# Patient Record
Sex: Female | Born: 1995 | Race: White | Hispanic: No | Marital: Single | Smoking: Never smoker
Health system: Southern US, Community
[De-identification: ages and names within clinical notes are randomized; demographics above are authoritative.]

## PROBLEM LIST (undated history)

## (undated) ENCOUNTER — Inpatient Hospital Stay (HOSPITAL_COMMUNITY): Payer: Self-pay

## (undated) DIAGNOSIS — J45909 Unspecified asthma, uncomplicated: Secondary | ICD-10-CM

## (undated) DIAGNOSIS — O139 Gestational [pregnancy-induced] hypertension without significant proteinuria, unspecified trimester: Secondary | ICD-10-CM

## (undated) HISTORY — PX: WISDOM TOOTH EXTRACTION: SHX21

## (undated) HISTORY — PX: NO PAST SURGERIES: SHX2092

## (undated) HISTORY — DX: Gestational (pregnancy-induced) hypertension without significant proteinuria, unspecified trimester: O13.9

---

## 2016-06-02 ENCOUNTER — Inpatient Hospital Stay (HOSPITAL_COMMUNITY)
Admission: AD | Admit: 2016-06-02 | Discharge: 2016-06-02 | Disposition: A | Discharge status: Home / Self Care | Payer: Medicaid Other | Source: Ambulatory Visit | Attending: Obstetrics and Gynecology | Admitting: Obstetrics and Gynecology

## 2016-06-02 ENCOUNTER — Encounter (HOSPITAL_COMMUNITY): Payer: Self-pay | Admitting: *Deleted

## 2016-06-02 ENCOUNTER — Inpatient Hospital Stay (HOSPITAL_COMMUNITY): Payer: Medicaid Other

## 2016-06-02 DIAGNOSIS — O209 Hemorrhage in early pregnancy, unspecified: Secondary | ICD-10-CM | POA: Insufficient documentation

## 2016-06-02 DIAGNOSIS — J45909 Unspecified asthma, uncomplicated: Secondary | ICD-10-CM | POA: Insufficient documentation

## 2016-06-02 DIAGNOSIS — O3482 Maternal care for other abnormalities of pelvic organs, second trimester: Secondary | ICD-10-CM | POA: Diagnosis not present

## 2016-06-02 DIAGNOSIS — O99512 Diseases of the respiratory system complicating pregnancy, second trimester: Secondary | ICD-10-CM | POA: Diagnosis not present

## 2016-06-02 DIAGNOSIS — R109 Unspecified abdominal pain: Secondary | ICD-10-CM | POA: Diagnosis present

## 2016-06-02 DIAGNOSIS — O208 Other hemorrhage in early pregnancy: Secondary | ICD-10-CM | POA: Insufficient documentation

## 2016-06-02 DIAGNOSIS — Z3A11 11 weeks gestation of pregnancy: Secondary | ICD-10-CM | POA: Diagnosis not present

## 2016-06-02 DIAGNOSIS — O021 Missed abortion: Secondary | ICD-10-CM | POA: Diagnosis not present

## 2016-06-02 DIAGNOSIS — N83202 Unspecified ovarian cyst, left side: Secondary | ICD-10-CM | POA: Diagnosis not present

## 2016-06-02 DIAGNOSIS — N939 Abnormal uterine and vaginal bleeding, unspecified: Secondary | ICD-10-CM | POA: Diagnosis present

## 2016-06-02 HISTORY — DX: Unspecified asthma, uncomplicated: J45.909

## 2016-06-02 LAB — URINALYSIS, ROUTINE W REFLEX MICROSCOPIC
Bilirubin Urine: NEGATIVE
GLUCOSE, UA: NEGATIVE mg/dL
KETONES UR: NEGATIVE mg/dL
Nitrite: NEGATIVE
PROTEIN: NEGATIVE mg/dL
Specific Gravity, Urine: <1.005 — ABNORMAL LOW (ref 1.005–1.030)
pH: 6 (ref 5.0–8.0)

## 2016-06-02 LAB — POCT PREGNANCY, URINE: Preg Test, Ur: POSITIVE — AB

## 2016-06-02 LAB — URINE MICROSCOPIC-ADD ON: RBC / HPF: NONE SEEN RBC/hpf (ref 0–5)

## 2016-06-02 LAB — CBC
HEMATOCRIT: 40.5 % (ref 36.0–46.0)
HEMOGLOBIN: 13.9 g/dL (ref 12.0–15.0)
MCH: 30.7 pg (ref 26.0–34.0)
MCHC: 34.3 g/dL (ref 30.0–36.0)
MCV: 89.4 fL (ref 78.0–100.0)
Platelets: 307 10*3/uL (ref 150–400)
RBC: 4.53 MIL/uL (ref 3.87–5.11)
RDW: 12.5 % (ref 11.5–15.5)
WBC: 14.7 10*3/uL — ABNORMAL HIGH (ref 4.0–10.5)

## 2016-06-02 LAB — ABO/RH: ABO/RH(D): A NEG

## 2016-06-02 LAB — HCG, QUANTITATIVE, PREGNANCY: hCG, Beta Chain, Quant, S: 8270 m[IU]/mL — ABNORMAL HIGH (ref <5)

## 2016-06-02 IMAGING — US US OB TRANSVAGINAL
1 series · 15 of 28 positions shown · non-contrast
Comparison: None.

CLINICAL DATA: Vaginal bleeding. Early pregnancy. Quantitative beta
HCG level is [DATE]. Patient is 12 weeks and 4 days pregnant based on
her last menstrual period.

EXAM:
OBSTETRIC <14 WK US AND TRANSVAGINAL OB US
TECHNIQUE: Both transabdominal and transvaginal ultrasound examinations were
performed for complete evaluation of the gestation as well as the
maternal uterus, adnexal regions, and pelvic cul-de-sac.
Transvaginal technique was performed to assess early pregnancy.

[Series 1: us ob transvaginal · 64 acquisitions, 15 frames shown]
[im 1/64]
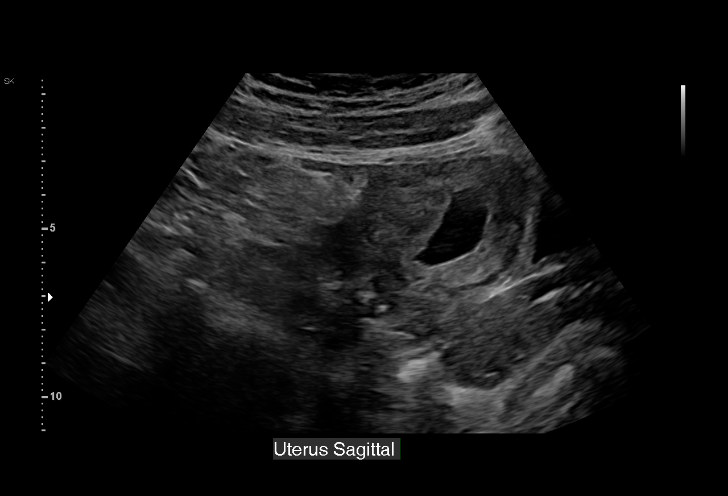
[im 5/64]
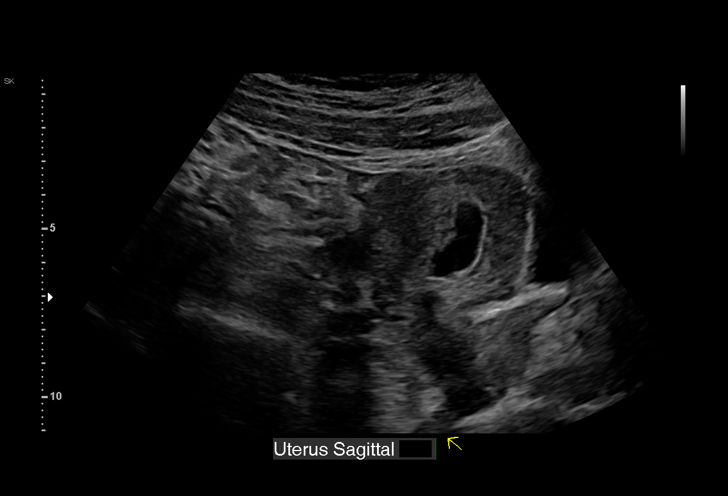
[im 10/64]
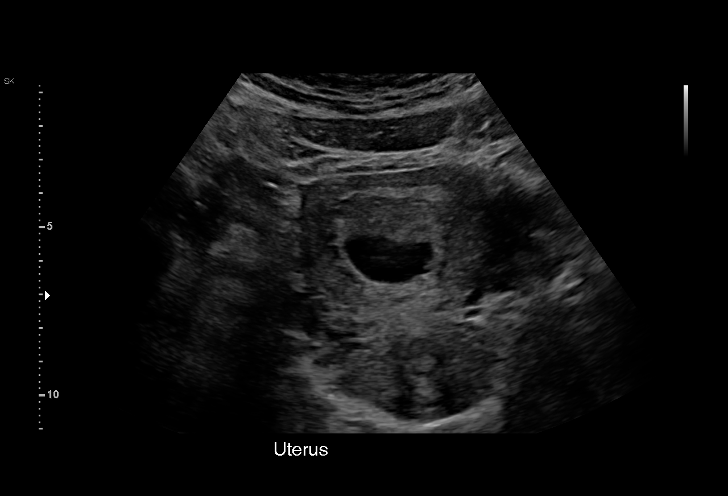
[im 15/64]
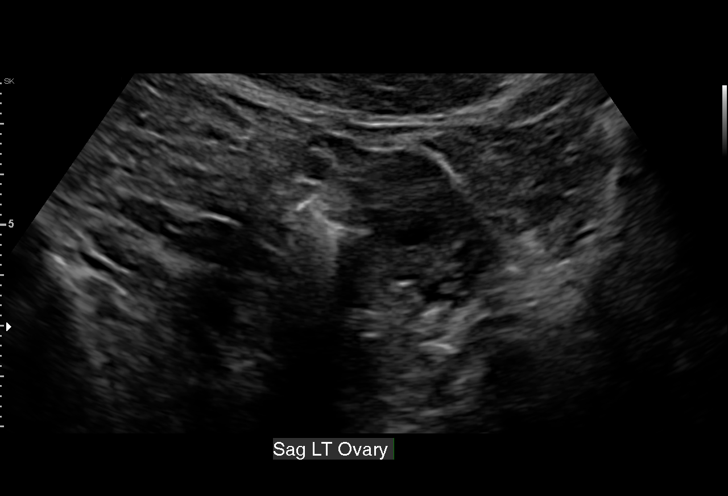
[im 19/64]
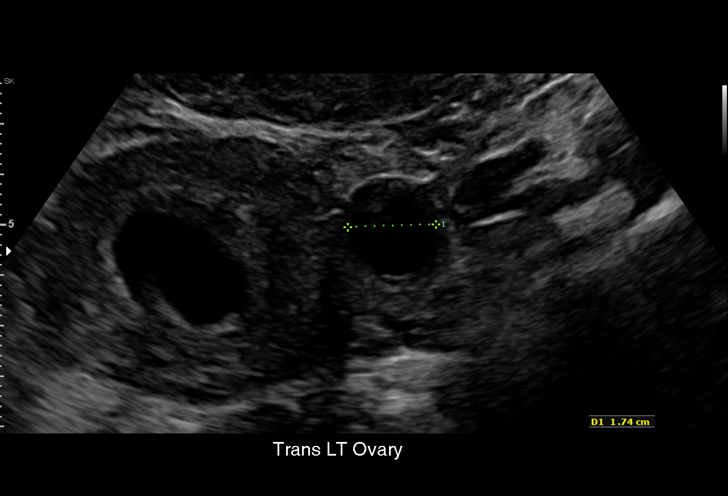
[im 24/64]
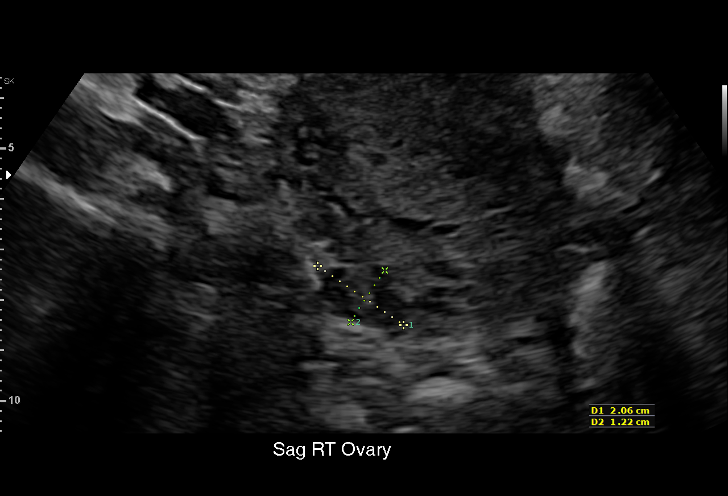
[im 29/64]
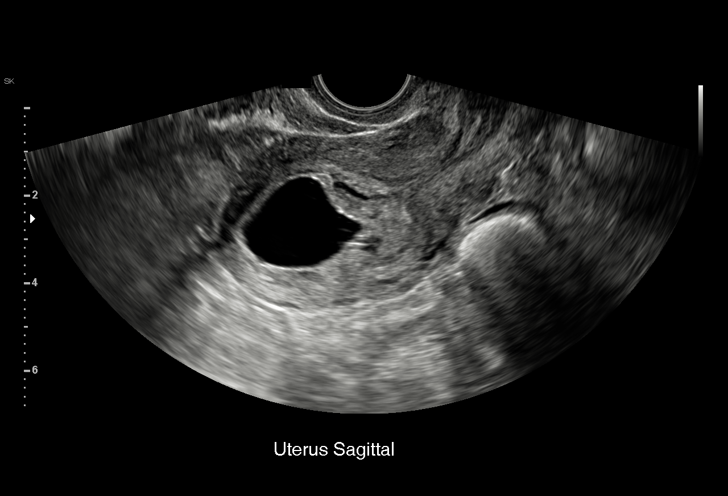
[im 33/64]
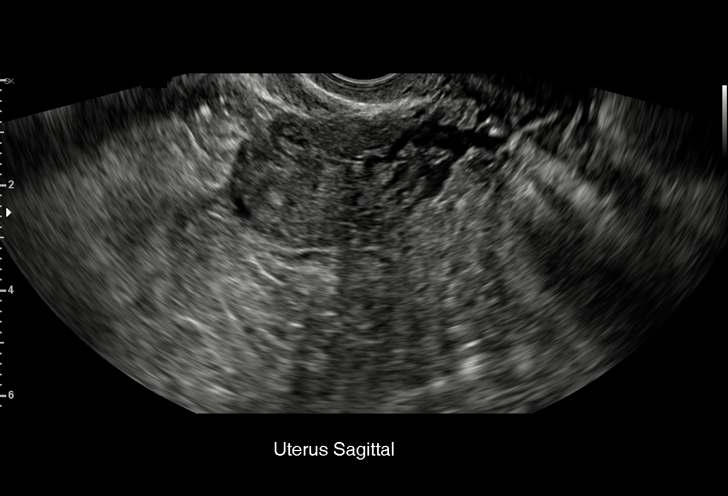
[im 36/64]
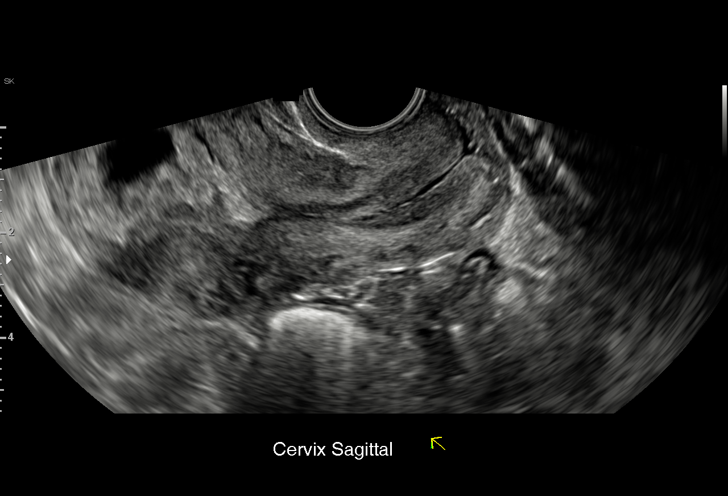
[im 40/64]
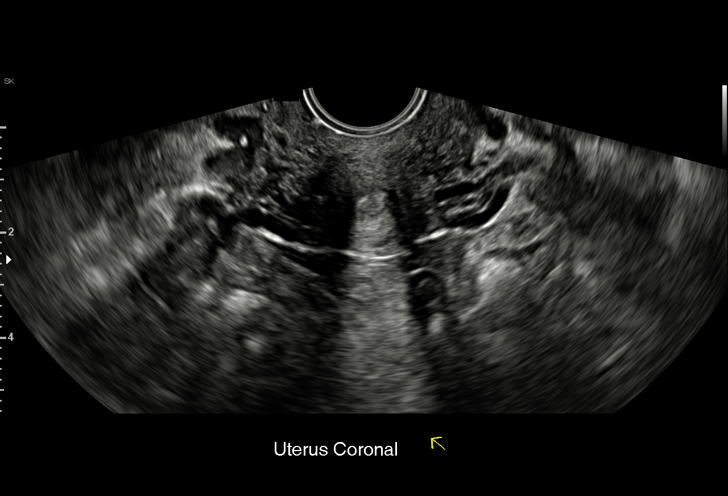
[im 45/64]
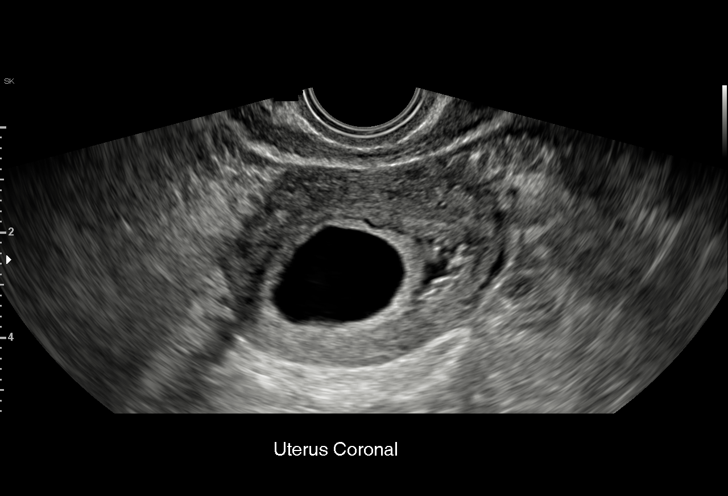
[im 50/64]
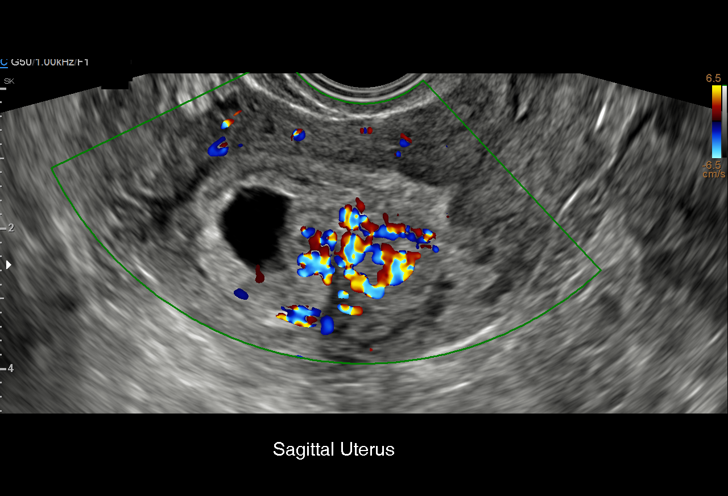
[im 54/64]
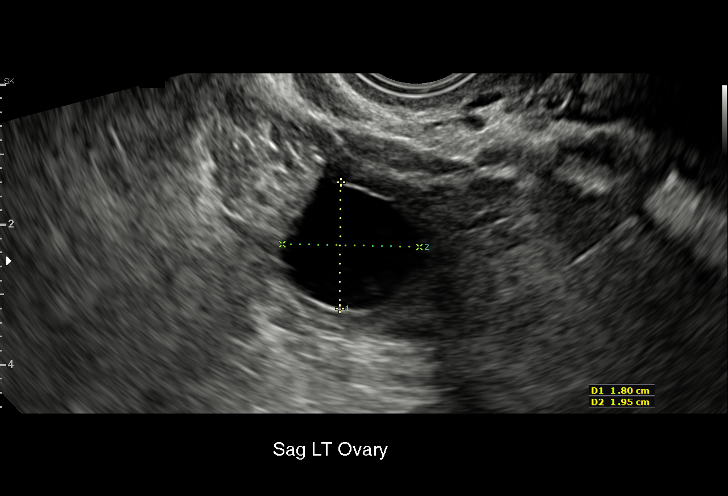
[im 59/64]
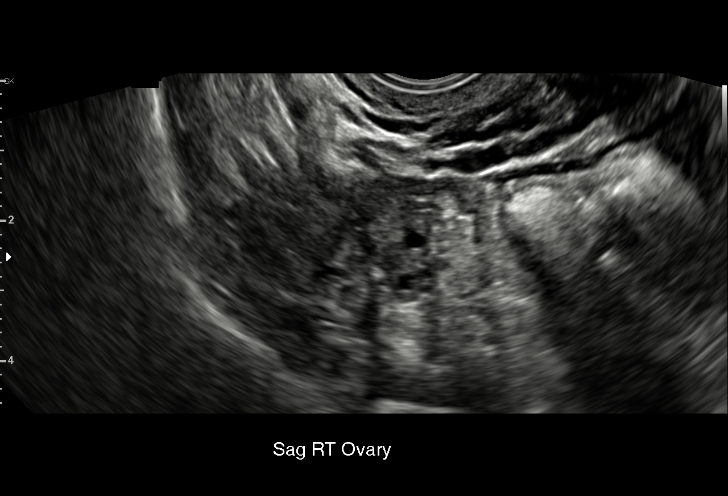
[im 64/64]
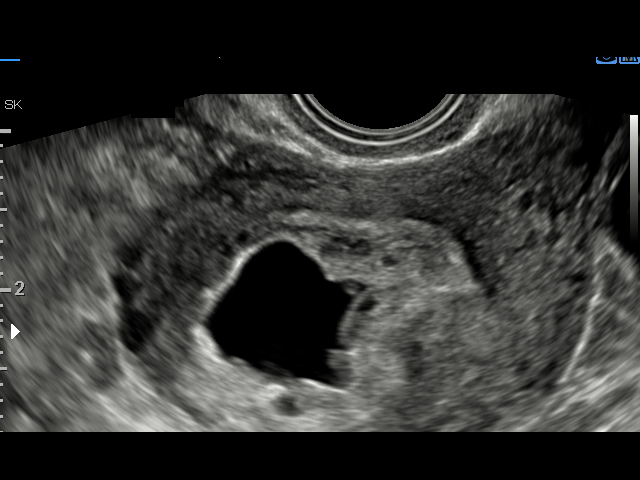

[15 of 28 positions shown; findings below may reference images not displayed]

FINDINGS: Intrauterine gestational sac: There is an irregular gestational sac
in the upper uterine segment.

Yolk sac:  No

Embryo:  No

Cardiac Activity: Not applicable

MSD: 25  mm   7 w   3  d

Subchorionic hemorrhage:  Small subchronic hemorrhage.

Maternal uterus/adnexae: No uterine masses. Cervix is closed. 2 cm
cyst arises from the left ovary. Normal right ovary. No adnexal
masses. No pelvic free fluid.
IMPRESSION: 1. Patient has an irregular gestational sac with a mean diameter of
25 mm with no embryo or yolk sac. Findings meet definitive criteria
for failed pregnancy. This follows SRU consensus guidelines:
Diagnostic Criteria for Nonviable Pregnancy Early in the First
Trimester. N Engl J Med [MM];[DATE].

2. Small subchronic hemorrhage. Unremarkable ovaries and adnexa. No
uterine masses. Closed cervix.

## 2016-06-02 MED ORDER — RHO D IMMUNE GLOBULIN 1500 UNIT/2ML IJ SOSY
300.0000 ug | PREFILLED_SYRINGE | Freq: Once | INTRAMUSCULAR | Status: AC
  Administered 2016-06-02: 300 ug via INTRAMUSCULAR
  Filled 2016-06-02: qty 2

## 2016-06-02 NOTE — MAU Provider Note (Signed)
History     CSN: [id number]  Arrival date and time: 06/02/16 1450   First Provider Initiated Contact with Patient 06/02/16 1809      Chief Complaint  Patient presents with  . Vaginal Bleeding  . Abdominal Pain   HPI Madison RedheadCynthia Cook is a 20 y.o. G1P0 at 4680w4d by LMP who presents with vaginal bleeding & abdominal cramping. LMP 03/13/16 & had positive UPT in July. Went to [facility] yesterday for vaginal bleeding & was told that the "pregnancy wasn't normal" and she was supposed to go back tomorrow for BHCG. Pt here for "second opinion". Reports vaginal bleeding; pink spotting since Sunday. Lower abdominal cramping like menstrual cramps since then as well that has improved today. Rates pain 2/10. Has not treated pain.   OB History    Gravida Para Term Preterm AB Living   1             SAB TAB Ectopic Multiple Live Births                  Past Medical History:  Diagnosis Date  . Asthma     Past Surgical History:  Procedure Laterality Date  . NO PAST SURGERIES      History reviewed. No pertinent family history.  Social History  Substance Use Topics  . Smoking status: Never Smoker  . Smokeless tobacco: Never Used  . Alcohol use No    Allergies: Allergies not on file  No prescriptions prior to admission.    Review of Systems  Constitutional: Negative.   Gastrointestinal: Positive for abdominal pain. Negative for constipation, diarrhea, nausea and vomiting.  Genitourinary: Negative for dysuria.       + vaginal bleeding   Physical Exam   Blood pressure 114/84, pulse 97, temperature 97.7 F (36.5 C), temperature source Oral, resp. rate 18, height 5\' 2"  (1.575 m), weight 196 lb (88.9 kg), last menstrual period 03/13/2016.  Physical Exam  Nursing note and vitals reviewed. Constitutional: She is oriented to person, place, and time. She appears well-developed and well-nourished. No distress.  HENT:  Head: Normocephalic and atraumatic.  Eyes: Conjunctivae are  normal. Right eye exhibits no discharge. Left eye exhibits no discharge. No scleral icterus.  Neck: Normal range of motion.  Respiratory: Effort normal. No respiratory distress.  GI: Soft. There is no tenderness.  Musculoskeletal: Normal range of motion.  Neurological: She is alert and oriented to person, place, and time.  Skin: Skin is warm and dry. She is not diaphoretic.  Psychiatric: She has a normal mood and affect. Her behavior is normal. Judgment and thought content normal.    MAU Course  Procedures Results for orders placed or performed during the hospital encounter of 06/02/16 (from the past 24 hour(s))  Urinalysis, Routine w reflex microscopic (not at [facility])     Status: Abnormal   Collection Time: 06/02/16  3:23 PM  Result Value Ref Range   Color, Urine YELLOW YELLOW   APPearance CLEAR CLEAR   Specific Gravity, Urine <1.005 (L) 1.005 - 1.030   pH 6.0 5.0 - 8.0   Glucose, UA NEGATIVE NEGATIVE mg/dL   Hgb urine dipstick SMALL (A) NEGATIVE   Bilirubin Urine NEGATIVE NEGATIVE   Ketones, ur NEGATIVE NEGATIVE mg/dL   Protein, ur NEGATIVE NEGATIVE mg/dL   Nitrite NEGATIVE NEGATIVE   Leukocytes, UA SMALL (A) NEGATIVE  Urine microscopic-add on     Status: Abnormal   Collection Time: 06/02/16  3:23 PM  Result Value Ref Range  Squamous Epithelial / LPF 0-5 (A) NONE SEEN   WBC, UA 0-5 0 - 5 WBC/hpf   RBC / HPF NONE SEEN 0 - 5 RBC/hpf   Bacteria, UA RARE (A) NONE SEEN  Pregnancy, urine POC     Status: Abnormal   Collection Time: 06/02/16  3:47 PM  Result Value Ref Range   Preg Test, Ur POSITIVE (A) NEGATIVE  Rh IG workup (includes ABO/Rh)     Status: None (Preliminary result)   Collection Time: 06/02/16  5:43 PM  Result Value Ref Range   Gestational Age(Wks) 10    ABO/RH(D) A NEG    Antibody Screen NEG    Unit Number [id number]    Blood Component Type RHIG    Unit division 00    Status of Unit ALLOCATED    Transfusion Status OK TO TRANSFUSE   CBC     Status: Abnormal    Collection Time: 06/02/16  5:53 PM  Result Value Ref Range   WBC 14.7 (H) 4.0 - 10.5 K/uL   RBC 4.53 3.87 - 5.11 MIL/uL   Hemoglobin 13.9 12.0 - 15.0 g/dL   HCT 11.9 14.7 - 82.9 %   MCV 89.4 78.0 - 100.0 fL   MCH 30.7 26.0 - 34.0 pg   MCHC 34.3 30.0 - 36.0 g/dL   RDW 56.2 13.0 - 86.5 %   Platelets 307 150 - 400 K/uL  ABO/Rh     Status: None   Collection Time: 06/02/16  5:53 PM  Result Value Ref Range   ABO/RH(D) A NEG   hCG, quantitative, pregnancy     Status: Abnormal   Collection Time: 06/02/16  5:53 PM  Result Value Ref Range   hCG, Beta Chain, Quant, S 8,270 (H) <5 mIU/mL   US Ob Comp Less 14 Wks  Result Date: 06/02/2016 CLINICAL DATA:  Vaginal bleeding. Early pregnancy. Quantitative beta HCG level is 8,270. Patient is 12 weeks and 4 days pregnant based on her last menstrual period. EXAM: OBSTETRIC <14 WK [place] AND TRANSVAGINAL OB US TECHNIQUE: Both transabdominal and transvaginal ultrasound examinations were performed for complete evaluation of the gestation as well as the maternal uterus, adnexal regions, and pelvic cul-de-sac. Transvaginal technique was performed to assess early pregnancy. COMPARISON:  None. FINDINGS: Intrauterine gestational sac: There is an irregular gestational sac in the upper uterine segment. Yolk sac:  No Embryo:  No Cardiac Activity: Not applicable MSD: 25  mm   7 w   3  d Subchorionic hemorrhage:  Small subchronic hemorrhage. Maternal uterus/adnexae: No uterine masses. Cervix is closed. 2 cm cyst arises from the left ovary. Normal right ovary. No adnexal masses. No pelvic free fluid. IMPRESSION: 1. Patient has an irregular gestational sac with a mean diameter of 25 mm with no embryo or yolk sac. Findings meet definitive criteria for failed pregnancy. This follows SRU consensus guidelines: Diagnostic Criteria for Nonviable Pregnancy Early in the First Trimester. Macy Mis J Med [phone]. 2. Small subchronic hemorrhage. Unremarkable ovaries and adnexa. No  uterine masses. Closed cervix. Electronically Signed   By: Amie Portland M.D.   On: 06/02/2016 19:33   US Ob Transvaginal  Result Date: 06/02/2016 CLINICAL DATA:  Vaginal bleeding. Early pregnancy. Quantitative beta HCG level is 8,270. Patient is 12 weeks and 4 days pregnant based on her last menstrual period. EXAM: OBSTETRIC <14 WK [place] AND TRANSVAGINAL OB US TECHNIQUE: Both transabdominal and transvaginal ultrasound examinations were performed for complete evaluation of the gestation as well as the maternal uterus,  adnexal regions, and pelvic cul-de-sac. Transvaginal technique was performed to assess early pregnancy. COMPARISON:  None. FINDINGS: Intrauterine gestational sac: There is an irregular gestational sac in the upper uterine segment. Yolk sac:  No Embryo:  No Cardiac Activity: Not applicable MSD: 25  mm   7 w   3  d Subchorionic hemorrhage:  Small subchronic hemorrhage. Maternal uterus/adnexae: No uterine masses. Cervix is closed. 2 cm cyst arises from the left ovary. Normal right ovary. No adnexal masses. No pelvic free fluid. IMPRESSION: 1. Patient has an irregular gestational sac with a mean diameter of 25 mm with no embryo or yolk sac. Findings meet definitive criteria for failed pregnancy. This follows SRU consensus guidelines: Diagnostic Criteria for Nonviable Pregnancy Early in the First Trimester. Macy Mis J Med [phone]. 2. Small subchronic hemorrhage. Unremarkable ovaries and adnexa. No uterine masses. Closed cervix. Electronically Signed   By: Amie Portland M.D.   On: 06/02/2016 19:33    MDM No access to records from yesterday's visit at [facility], BHCG, abo/rh, ultrasound A negative -- rhogham today Ultrasound shows irregularly shaped IUGS measuring 25 mm; no embryo or yolk sac; meets criteria for failed pregnancy.   Discussed options for management of incomplete AB including expectant management, Cytotec or D&C. Prefers expectant management at this time. Verbalizes  understanding that intervention may become necessary if SAB is not completed spontaneously or if heavy bleeding or infection occur.   Assessment and Plan  A:  1. Missed abortion   2. Vaginal bleeding in pregnancy, first trimester     P: Discharge home Discussed reasons to return to MAU -- heavy bleeding, severe abdominal pain, fever Msg sent to [facility] for miscarriage f/u in 2 weeks  Judeth Horn 06/02/2016, 6:09 PM

## 2016-06-02 NOTE — Discharge Instructions (Signed)
Return to care  °· If you have heavier bleeding that soaks through more that 2 pads per hour for an hour or more °· If you bleed so much that you feel like you might pass out or you do pass out °· If you have significant abdominal pain that is not improved with Tylenol  °· If you develop a fever > 100.5 ° ° ° °Miscarriage °A miscarriage is the sudden loss of an unborn baby (fetus) before the 20th week of pregnancy. Most miscarriages happen in the first 3 months of pregnancy. Sometimes, it happens before a woman even knows she is pregnant. A miscarriage is also called a "spontaneous miscarriage" or "early pregnancy loss." Having a miscarriage can be an emotional experience. Talk with your caregiver about any questions you may have about miscarrying, the grieving process, and your future pregnancy plans. °CAUSES  °· Problems with the fetal chromosomes that make it impossible for the baby to develop normally. Problems with the baby's genes or chromosomes are most often the result of errors that occur, by chance, as the embryo divides and grows. The problems are not inherited from the parents. °· Infection of the cervix or uterus.   °· Hormone problems.   °· Problems with the cervix, such as having an incompetent cervix. This is when the tissue in the cervix is not strong enough to hold the pregnancy.   °· Problems with the uterus, such as an abnormally shaped uterus, uterine fibroids, or congenital abnormalities.   °· Certain medical conditions.   °· Smoking, drinking alcohol, or taking illegal drugs.   °· Trauma.   °Often, the cause of a miscarriage is unknown.  °SYMPTOMS  °· Vaginal bleeding or spotting, with or without cramps or pain. °· Pain or cramping in the abdomen or lower back. °· Passing fluid, tissue, or blood clots from the vagina. °DIAGNOSIS  °Your caregiver will perform a physical exam. You may also have an ultrasound to confirm the miscarriage. Blood or urine tests may also be ordered. °TREATMENT   °· Sometimes, treatment is not necessary if you naturally pass all the fetal tissue that was in the uterus. If some of the fetus or placenta remains in the body (incomplete miscarriage), tissue left behind may become infected and must be removed. Usually, a dilation and curettage (D and C) procedure is performed. During a D and C procedure, the cervix is widened (dilated) and any remaining fetal or placental tissue is gently removed from the uterus. °· Antibiotic medicines are prescribed if there is an infection. Other medicines may be given to reduce the size of the uterus (contract) if there is a lot of bleeding. °· If you have Rh negative blood and your baby was Rh positive, you will need a Rh immunoglobulin shot. This shot will protect any future baby from having Rh blood problems in future pregnancies. °HOME CARE INSTRUCTIONS  °· Your caregiver may order bed rest or may allow you to continue light activity. Resume activity as directed by your caregiver. °· Have someone help with home and family responsibilities during this time.   °· Keep track of the number of sanitary pads you use each day and how soaked (saturated) they are. Write down this information.   °· Do not use tampons. Do not douche or have sexual intercourse until approved by your caregiver.   °· Only take over-the-counter or prescription medicines for pain or discomfort as directed by your caregiver.   °· Do not take aspirin. Aspirin can cause bleeding.   °· Keep all follow-up appointments with your   caregiver.   °· If you or your partner have problems with grieving, talk to your caregiver or seek counseling to help cope with the pregnancy loss. Allow enough time to grieve before trying to get pregnant again.   °SEEK IMMEDIATE MEDICAL CARE IF:  °· You have severe cramps or pain in your back or abdomen. °· You have a fever. °· You pass large blood clots (walnut-sized or larger) or tissue from your vagina. Save any tissue for your caregiver to  inspect.   °· Your bleeding increases.   °· You have a thick, bad-smelling vaginal discharge. °· You become lightheaded, weak, or you faint.   °· You have chills.   °MAKE SURE YOU: °· Understand these instructions. °· Will watch your condition. °· Will get help right away if you are not doing well or get worse. °  °This information is not intended to replace advice given to you by your health care provider. Make sure you discuss any questions you have with your health care provider. °  °Document Released: 03/15/2001 Document Revised: 01/14/2013 Document Reviewed: 11/08/2011 °Elsevier Interactive Patient Education ©2016 Elsevier Inc. ° ° °Pelvic Rest °Pelvic rest is sometimes recommended for women when:  °· The placenta is partially or completely covering the opening of the cervix (placenta previa). °· There is bleeding between the uterine wall and the amniotic sac in the first trimester (subchorionic hemorrhage). °· The cervix begins to open without labor starting (incompetent cervix, cervical insufficiency). °· The labor is too early (preterm labor). °HOME CARE INSTRUCTIONS °· Do not have sexual intercourse, stimulation, or an orgasm. °· Do not use tampons, douche, or put anything in the vagina. °· Do not lift anything over 10 pounds (4.5 kg). °· Avoid strenuous activity or straining your pelvic muscles. °SEEK MEDICAL CARE IF:  °· You have any vaginal bleeding during pregnancy. Treat this as a potential emergency. °· You have cramping pain felt low in the stomach (stronger than menstrual cramps). °· You notice vaginal discharge (watery, mucus, or bloody). °· You have a low, dull backache. °· There are regular contractions or uterine tightening. °SEEK IMMEDIATE MEDICAL CARE IF: °You have vaginal bleeding and have placenta previa.  °  °This information is not intended to replace advice given to you by your health care provider. Make sure you discuss any questions you have with your health care provider. °  °Document  Released: 01/14/2011 Document Revised: 12/12/2011 Document Reviewed: 03/23/2015 °Elsevier Interactive Patient Education ©2016 Elsevier Inc. ° °

## 2016-06-02 NOTE — MAU Note (Signed)
Pt started bleeding Sunday night, no bleeding today.  Was seeing bleeding on toilet tissue.  Was seen at [facility] yesterday, had an U/S, was told she might be having a miscarriage. Pos HPT 3 months ago, has been having lower abd pain ever since.  No PNC.

## 2016-06-03 LAB — RH IG WORKUP (INCLUDES ABO/RH)
ABO/RH(D): A NEG
ANTIBODY SCREEN: NEGATIVE
Gestational Age(Wks): 10
UNIT DIVISION: 0

## 2017-04-07 ENCOUNTER — Encounter (HOSPITAL_COMMUNITY): Payer: Self-pay

## 2017-11-07 DIAGNOSIS — J0100 Acute maxillary sinusitis, unspecified: Secondary | ICD-10-CM | POA: Diagnosis not present

## 2017-11-29 DIAGNOSIS — J111 Influenza due to unidentified influenza virus with other respiratory manifestations: Secondary | ICD-10-CM | POA: Diagnosis not present

## 2017-11-29 DIAGNOSIS — J0100 Acute maxillary sinusitis, unspecified: Secondary | ICD-10-CM | POA: Diagnosis not present

## 2018-01-04 DIAGNOSIS — K591 Functional diarrhea: Secondary | ICD-10-CM | POA: Diagnosis not present

## 2018-02-07 DIAGNOSIS — J0100 Acute maxillary sinusitis, unspecified: Secondary | ICD-10-CM | POA: Diagnosis not present

## 2018-02-20 DIAGNOSIS — N3091 Cystitis, unspecified with hematuria: Secondary | ICD-10-CM | POA: Diagnosis not present

## 2018-02-20 DIAGNOSIS — R1084 Generalized abdominal pain: Secondary | ICD-10-CM | POA: Diagnosis not present

## 2018-02-20 DIAGNOSIS — M545 Low back pain: Secondary | ICD-10-CM | POA: Diagnosis not present

## 2018-02-20 DIAGNOSIS — N3001 Acute cystitis with hematuria: Secondary | ICD-10-CM | POA: Diagnosis not present

## 2018-02-21 DIAGNOSIS — H1045 Other chronic allergic conjunctivitis: Secondary | ICD-10-CM | POA: Diagnosis not present

## 2018-02-21 DIAGNOSIS — H52221 Regular astigmatism, right eye: Secondary | ICD-10-CM | POA: Diagnosis not present

## 2018-02-21 DIAGNOSIS — H5213 Myopia, bilateral: Secondary | ICD-10-CM | POA: Diagnosis not present

## 2018-03-19 DIAGNOSIS — H5213 Myopia, bilateral: Secondary | ICD-10-CM | POA: Diagnosis not present

## 2018-05-10 DIAGNOSIS — H00025 Hordeolum internum left lower eyelid: Secondary | ICD-10-CM | POA: Diagnosis not present

## 2018-06-05 DIAGNOSIS — K591 Functional diarrhea: Secondary | ICD-10-CM | POA: Diagnosis not present

## 2018-06-06 DIAGNOSIS — R197 Diarrhea, unspecified: Secondary | ICD-10-CM | POA: Diagnosis not present

## 2018-07-04 DIAGNOSIS — Z3A01 Less than 8 weeks gestation of pregnancy: Secondary | ICD-10-CM | POA: Diagnosis not present

## 2018-07-27 DIAGNOSIS — O23591 Infection of other part of genital tract in pregnancy, first trimester: Secondary | ICD-10-CM | POA: Diagnosis not present

## 2018-07-27 DIAGNOSIS — N760 Acute vaginitis: Secondary | ICD-10-CM | POA: Diagnosis not present

## 2018-07-27 DIAGNOSIS — B9689 Other specified bacterial agents as the cause of diseases classified elsewhere: Secondary | ICD-10-CM | POA: Diagnosis not present

## 2018-09-03 ENCOUNTER — Encounter (HOSPITAL_COMMUNITY): Payer: Self-pay | Admitting: *Deleted

## 2018-09-03 ENCOUNTER — Inpatient Hospital Stay (HOSPITAL_COMMUNITY)
Admission: AD | Admit: 2018-09-03 | Discharge: 2018-09-04 | Disposition: A | Discharge status: Home / Self Care | Payer: Commercial Managed Care - PPO | Source: Ambulatory Visit | Attending: Obstetrics and Gynecology | Admitting: Obstetrics and Gynecology

## 2018-09-03 DIAGNOSIS — Z3201 Encounter for pregnancy test, result positive: Secondary | ICD-10-CM

## 2018-09-03 DIAGNOSIS — Z3A14 14 weeks gestation of pregnancy: Secondary | ICD-10-CM | POA: Insufficient documentation

## 2018-09-03 DIAGNOSIS — Z3687 Encounter for antenatal screening for uncertain dates: Secondary | ICD-10-CM | POA: Insufficient documentation

## 2018-09-03 NOTE — MAU Note (Signed)
PT SAYS CYCLES ARE USUALLY REG.  BUT  LMP WAS 8-28.- NO BLEEDING   SINCE  THEN.   LAST SEX-  OCT.   NO BIRTH CONTROL.  PT SAYS DID HPT-  OCT - POSITIVE.   WENT  TO DR FOR PNC   IN [place]- TOLD HER  EDC WAS 03-06-2019    AND  SHE THINKS  THEIR DATES ARE OFF- SHE THINKS  IT SHOULD  BE IN MAY.

## 2018-09-04 ENCOUNTER — Encounter (HOSPITAL_COMMUNITY): Payer: Self-pay | Admitting: *Deleted

## 2018-09-04 DIAGNOSIS — Z3A14 14 weeks gestation of pregnancy: Secondary | ICD-10-CM | POA: Diagnosis not present

## 2018-09-04 DIAGNOSIS — Z3401 Encounter for supervision of normal first pregnancy, first trimester: Secondary | ICD-10-CM | POA: Diagnosis not present

## 2018-09-04 DIAGNOSIS — Z3687 Encounter for antenatal screening for uncertain dates: Secondary | ICD-10-CM | POA: Diagnosis not present

## 2018-09-04 NOTE — MAU Provider Note (Signed)
Ms. Vella RedheadCynthia Colpitts is a 22 y.o. G1P0010 who present to MAU today for pregnancy confirmation. She denies abdominal pain or vaginal bleeding. She reports she has been seen for care at [facility] in [place]. Bedside US completed there noted patient was 73104w6d and she is unsure about dating and wants confirmation due to her believing she is a further gestation.   BP (!) 128/91 (BP Location: Right Arm)   Pulse 95   Temp 98.7 F (37.1 C) (Oral)   Resp 18   Ht 5\' 2"  (1.575 m)   Wt 100 kg   LMP 05/30/2018   BMI 40.33 kg/m  CONSTITUTIONAL: Well-developed, obese female in no acute distress.  CARDIOVASCULAR: Normal heart rate noted RESPIRATORY: Effort and breath sounds normal GASTROINTESTINAL:Soft. No tenderness, rebound or guarding.  SKIN: Skin is warm and dry. No rash noted. Not diaphoretic. No erythema. No pallor. PSYCHIATRIC: Normal mood and affect. Normal behavior. Normal judgment and thought content.  MDM Medical screening exam complete Bedside US   FHR 157 by doppler  Pt informed that the ultrasound is considered a limited OB ultrasound and is not intended to be a complete ultrasound exam.  Patient also informed that the ultrasound is not being completed with the intent of assessing for fetal or placental anomalies or any pelvic abnormalities.  Explained that the purpose of today's ultrasound is to assess for  dating.  Patient acknowledges the purpose of the exam and the limitations of the study.   CRL, FL, and HC measured by bedside US   Dating by bedside US is 572w5d which is consistent with dating by US given at  [facility] [place]. Patient verbalizes understanding and plans to continue care with [facility] in [place] as scheduled.   A:  [redacted] weeks gestation   P: Discharge home Follow up for prenatal care as scheduled with [facility] in [place]  Reasons to return to MAU reviewed  Patient may return to MAU as needed for emergencies   Sharyon CableRogers, Ayde Record  C, CNM 09/04/2018 12:38 AM

## 2018-09-04 NOTE — Discharge Instructions (Signed)

## 2018-10-03 NOTE — L&D Delivery Note (Signed)
Delivery Note At 6:49 PM a viable and healthy female was delivered via Vaginal, Spontaneous (Presentation: ROA ).  APGAR: 7, 5; weight 6 lb 3.1 oz (2809 g).   Placenta status: spontaneous, intact .  Cord: 3 vessel  with the following complications: none .    Anesthesia:  epidural Episiotomy: None Lacerations: 2nd degree;Perineal;Labial Suture Repair: 3.0 monocryl Est. Blood Loss (mL): 100  Mom to postpartum.  Baby to Couplet care / Skin to Skin.  Madison Cook is a 23 y.o. female G2P0010 with IUP at [redacted]w[redacted]d admitted for IOL for gHTN.  She progressed with augmentation to complete and pushed 1 hour, stopped, then restarted pushing an additional hour to deliver.  Cord clamping delayed by several minutes then clamped by CNM and cut by FOB.  Placenta intact and spontaneous, bleeding minimal.  Second degree perineal laceration repaired without difficulty. 1st degree left labial hemostatic not repaired.  Mom and baby stable prior to transfer to postpartum. She plans on breastfeeding.     Sharen Counter 02/14/2019, 8:45 PM

## 2018-12-11 DIAGNOSIS — Z8759 Personal history of other complications of pregnancy, childbirth and the puerperium: Secondary | ICD-10-CM

## 2018-12-11 DIAGNOSIS — O139 Gestational [pregnancy-induced] hypertension without significant proteinuria, unspecified trimester: Secondary | ICD-10-CM | POA: Insufficient documentation

## 2018-12-11 HISTORY — DX: Personal history of other complications of pregnancy, childbirth and the puerperium: Z87.59

## 2018-12-17 ENCOUNTER — Other Ambulatory Visit: Payer: Self-pay

## 2018-12-17 ENCOUNTER — Ambulatory Visit (INDEPENDENT_AMBULATORY_CARE_PROVIDER_SITE_OTHER): Payer: Commercial Managed Care - PPO | Admitting: Obstetrics and Gynecology

## 2018-12-17 ENCOUNTER — Encounter: Payer: Self-pay | Admitting: Radiology

## 2018-12-17 DIAGNOSIS — O26899 Other specified pregnancy related conditions, unspecified trimester: Secondary | ICD-10-CM

## 2018-12-17 DIAGNOSIS — O163 Unspecified maternal hypertension, third trimester: Secondary | ICD-10-CM

## 2018-12-17 DIAGNOSIS — O99210 Obesity complicating pregnancy, unspecified trimester: Secondary | ICD-10-CM | POA: Insufficient documentation

## 2018-12-17 DIAGNOSIS — Z3A28 28 weeks gestation of pregnancy: Secondary | ICD-10-CM

## 2018-12-17 DIAGNOSIS — Z6791 Unspecified blood type, Rh negative: Secondary | ICD-10-CM | POA: Insufficient documentation

## 2018-12-17 DIAGNOSIS — O0990 Supervision of high risk pregnancy, unspecified, unspecified trimester: Secondary | ICD-10-CM | POA: Insufficient documentation

## 2018-12-17 DIAGNOSIS — O99213 Obesity complicating pregnancy, third trimester: Secondary | ICD-10-CM

## 2018-12-17 DIAGNOSIS — Z8709 Personal history of other diseases of the respiratory system: Secondary | ICD-10-CM | POA: Insufficient documentation

## 2018-12-17 DIAGNOSIS — O133 Gestational [pregnancy-induced] hypertension without significant proteinuria, third trimester: Secondary | ICD-10-CM

## 2018-12-17 DIAGNOSIS — O26893 Other specified pregnancy related conditions, third trimester: Secondary | ICD-10-CM

## 2018-12-17 DIAGNOSIS — Z6841 Body Mass Index (BMI) 40.0 and over, adult: Secondary | ICD-10-CM | POA: Insufficient documentation

## 2018-12-17 HISTORY — DX: Other specified pregnancy related conditions, unspecified trimester: O26.899

## 2018-12-17 HISTORY — DX: Unspecified blood type, rh negative: Z67.91

## 2018-12-17 MED ORDER — ASPIRIN EC 81 MG PO TBEC: 81.0000 mg | DELAYED_RELEASE_TABLET | Freq: Every day | ORAL | 10 refills | Status: DC

## 2018-12-17 NOTE — Progress Notes (Signed)
Rhogram given last week Need 2 hour gtt

## 2018-12-17 NOTE — Patient Instructions (Signed)
Call us for any pre-eclampsia signs and symptoms or if your blood pressures are consistently about 150/100   Hypertension During Pregnancy  Hypertension is also called high blood pressure. High blood pressure means that the force of your blood moving in your body is too strong. When you are pregnant, this condition should be watched carefully. It can cause problems for you and your baby. Follow these instructions at home: Eating and drinking   Drink enough fluid to keep your pee (urine) pale yellow.  Avoid caffeine. Lifestyle  Do not use any products that contain nicotine or tobacco, such as cigarettes and e-cigarettes. If you need help quitting, ask your doctor.  Do not use alcohol or drugs.  Avoid stress.  Rest and get plenty of sleep. General instructions  Take over-the-counter and prescription medicines only as told by your doctor.  While lying down, lie on your left side. This keeps pressure off your major blood vessels.  While sitting or lying down, raise (elevate) your feet. Try putting some pillows under your lower legs.  Exercise regularly. Ask your doctor what kinds of exercise are best for you.  Keep all prenatal and follow-up visits as told by your doctor. This is important. Contact a doctor if:  You have symptoms that your doctor told you to watch for, such as: ? Throwing up (vomiting). ? Feeling sick to your stomach (nausea). ? Headache. Get help right away if you have:  Very bad belly pain that does not get better with treatment.  A very bad headache that does not get better.  Throwing up that does not get better with treatment.  Sudden, fast weight gain.  Sudden swelling in your hands, ankles, or face.  Bleeding from your vagina.  Blood in your pee.  Fewer movements from your baby than usual.  Blurry vision.  Double vision.  Muscle twitching.  Sudden muscle tightening (spasms).  Trouble breathing.  Blue fingernails or  lips. Summary  Hypertension is also called high blood pressure. High blood pressure means that the force of your blood moving in your body is too strong.  When you are pregnant, this condition should be watched carefully. It can cause problems for you and your baby.  Get help right away if you have symptoms that your doctor told you to watch for. This information is not intended to replace advice given to you by your health care provider. Make sure you discuss any questions you have with your health care provider. Document Released: 10/22/2010 Document Revised: 09/05/2017 Document Reviewed: 05/31/2016 Elsevier Interactive Patient Education  2019 [employer].

## 2018-12-17 NOTE — Progress Notes (Addendum)
Prenatal Visit Note Date: 12/17/2018 Clinic: [facility]  Transfer of Care Visit from [facility]   Subjective:  Madison Cook is a 23 y.o. G2P0010 at [redacted]w[redacted]d being seen today for ongoing prenatal care.  She is currently monitored for the following issues for this high-risk pregnancy and has Gestational hypertension; BMI 40.0-44.9, adult (HCC); Obesity in pregnancy; Supervision of high risk pregnancy, antepartum; Rh negative state in antepartum period; and History of influenza on their problem list.  Patient reports no complaints. She denies any s/s of pre-eclampsia Contractions: Not present. Vag. Bleeding: None.  Movement: Present. Denies leaking of fluid.   The following portions of the patient's history were reviewed and updated as appropriate: allergies, current medications, past family history, past medical history, past social history, past surgical history and problem list. Problem list updated.  Objective:   Vitals:   12/17/18 1410  BP: (!) 124/91  Pulse: 72  Weight: 243 lb 3.2 oz (110.3 kg)    Fetal Status: Fetal Heart Rate (bpm): 152 Fundal Height: 28 cm Movement: Present     General:  Alert, oriented and cooperative. Patient is in no acute distress.  Skin: Skin is warm and dry. No rash noted.   Cardiovascular: Normal heart rate noted  Respiratory: Normal respiratory effort, no problems with respiration noted  Abdomen: Soft, gravid, appropriate for gestational age. Pain/Pressure: Absent     Pelvic:  Cervical exam deferred        Extremities: Normal range of motion.  Edema: None  Mental Status: Normal mood and affect. Normal behavior. Normal judgment and thought content.   Urinalysis:      Assessment and Plan:  Pregnancy: G2P0010 at [redacted]w[redacted]d  1. Elevated blood pressure complicating pregnancy in third trimester, antepartum D/w her that I agree with her prior OB provider's dx of HTN in pregnancy. Pt denies a h/o HTN outside of this episode  and outside of pregnancy. She states she didn't like how she was treated at the prior practice and that is why she wanted to come to [place]. She states she was sent to [facility] triage at [facility] after her last ROB visit where her HTN was first noted, they drew labs and discharged her; she says she wasn't sent home on meds.   I told her that based on her history she has the dx of gHTN, which can progress to mild and then severe pre-x. Precautions given. I told her I recommend weekly u/s, labs and visits for close monitoring. Will set up for asap growth u/s and bpp  She asked if there was anything she can do to prevent it from getting worse and I told her aside from keeping her visits and being watchful of her s/s that there isn't much else. 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 I also told her that starting a low dose aspirin wouldn't hurt   - Protein / creatinine ratio, urine - Antibody screen - CBC - HIV antibody (with reflex) - RPR - Comprehensive metabolic panel - [place] MFM OB DETAIL +14 WK; Future - [place] MFM FETAL BPP WO NON STRESS; Future  2. BMI 40.0-44.9, adult (HCC)  3. Obesity in pregnancy  4. Supervision of high risk pregnancy, antepartum Pt to come back for 2h GTT  5. Gestational hypertension, third trimester  6. Rh negative state in antepartum period She states she got rhogam at her last ROB with her prior provider  on 3/10 but didn't get labs there but did get them in the hospital. Will request records from [place] and do an antibody screen today  7. History of influenza No sequelae.   Preterm labor symptoms and general obstetric precautions including but not limited to vaginal bleeding, contractions, leaking of fluid and fetal movement were reviewed in detail with the patient. Please refer to After Visit Summary for other counseling recommendations.  Return in about 1 week (around 12/24/2018) for hrob  and labs.   [place] Bing, MD

## 2018-12-18 ENCOUNTER — Encounter: Payer: Self-pay | Admitting: Radiology

## 2018-12-18 LAB — COMPREHENSIVE METABOLIC PANEL
ALBUMIN: 3.7 g/dL — AB (ref 3.9–5.0)
ALK PHOS: 73 IU/L (ref 39–117)
ALT: 11 IU/L (ref 0–32)
AST: 13 IU/L (ref 0–40)
Albumin/Globulin Ratio: 1.3 (ref 1.2–2.2)
BILIRUBIN TOTAL: 0.2 mg/dL (ref 0.0–1.2)
BUN / CREAT RATIO: 10 (ref 9–23)
BUN: 6 mg/dL (ref 6–20)
CHLORIDE: 101 mmol/L (ref 96–106)
CO2: 20 mmol/L (ref 20–29)
Calcium: 9.5 mg/dL (ref 8.7–10.2)
Creatinine, Ser: 0.58 mg/dL (ref 0.57–1.00)
GFR calc Af Amer: 152 mL/min/{1.73_m2} (ref >59)
GFR calc non Af Amer: 132 mL/min/{1.73_m2} (ref >59)
GLUCOSE: 91 mg/dL (ref 65–99)
Globulin, Total: 2.9 g/dL (ref 1.5–4.5)
Potassium: 4 mmol/L (ref 3.5–5.2)
Sodium: 138 mmol/L (ref 134–144)
TOTAL PROTEIN: 6.6 g/dL (ref 6.0–8.5)

## 2018-12-18 LAB — AB SCR+ANTIBODY ID: ANTIBODY SCREEN: POSITIVE — AB

## 2018-12-18 LAB — CBC
HEMATOCRIT: 34.8 % (ref 34.0–46.6)
Hemoglobin: 11.8 g/dL (ref 11.1–15.9)
MCH: 29.9 pg (ref 26.6–33.0)
MCHC: 33.9 g/dL (ref 31.5–35.7)
MCV: 88 fL (ref 79–97)
Platelets: 277 10*3/uL (ref 150–450)
RBC: 3.95 x10E6/uL (ref 3.77–5.28)
RDW: 12.4 % (ref 11.7–15.4)
WBC: 16.8 10*3/uL — AB (ref 3.4–10.8)

## 2018-12-18 LAB — PROTEIN / CREATININE RATIO, URINE
CREATININE, UR: 55.6 mg/dL
Protein, Ur: 9.9 mg/dL
Protein/Creat Ratio: 178 mg/g creat (ref 0–200)

## 2018-12-18 LAB — SYPHILIS: RPR W/REFLEX TO RPR TITER AND TREPONEMAL ANTIBODIES, TRADITIONAL SCREENING AND DIAGNOSIS ALGORITHM: RPR Ser Ql: NONREACTIVE

## 2018-12-18 LAB — ANTIBODY SCREEN

## 2018-12-18 LAB — HIV ANTIBODY (ROUTINE TESTING W REFLEX): HIV Screen 4th Generation wRfx: NONREACTIVE

## 2018-12-19 ENCOUNTER — Other Ambulatory Visit: Payer: Self-pay

## 2018-12-19 ENCOUNTER — Other Ambulatory Visit: Payer: Commercial Managed Care - PPO

## 2018-12-19 DIAGNOSIS — O0990 Supervision of high risk pregnancy, unspecified, unspecified trimester: Secondary | ICD-10-CM

## 2018-12-20 LAB — GLUCOSE TOLERANCE, 2 HOURS W/ 1HR
GLUCOSE, 1 HOUR: 138 mg/dL (ref 65–179)
GLUCOSE, 2 HOUR: 89 mg/dL (ref 65–152)
GLUCOSE, FASTING: 80 mg/dL (ref 65–91)

## 2018-12-20 LAB — CBC
HEMOGLOBIN: 12.2 g/dL (ref 11.1–15.9)
Hematocrit: 34.8 % (ref 34.0–46.6)
MCH: 31.9 pg (ref 26.6–33.0)
MCHC: 35.1 g/dL (ref 31.5–35.7)
MCV: 91 fL (ref 79–97)
PLATELETS: 252 10*3/uL (ref 150–450)
RBC: 3.83 x10E6/uL (ref 3.77–5.28)
RDW: 12.5 % (ref 11.7–15.4)
WBC: 18.9 10*3/uL — ABNORMAL HIGH (ref 3.4–10.8)

## 2018-12-20 LAB — HIV ANTIBODY (ROUTINE TESTING W REFLEX): HIV SCREEN 4TH GENERATION: NONREACTIVE

## 2018-12-20 LAB — RPR: RPR: NONREACTIVE

## 2018-12-25 ENCOUNTER — Ambulatory Visit (INDEPENDENT_AMBULATORY_CARE_PROVIDER_SITE_OTHER): Payer: Commercial Managed Care - PPO | Admitting: Obstetrics & Gynecology

## 2018-12-25 ENCOUNTER — Ambulatory Visit (HOSPITAL_COMMUNITY)
Admission: RE | Admit: 2018-12-25 | Discharge: 2018-12-25 | Disposition: A | Discharge status: Home / Self Care | Payer: Commercial Managed Care - PPO | Source: Ambulatory Visit | Attending: Obstetrics and Gynecology | Admitting: Obstetrics and Gynecology

## 2018-12-25 ENCOUNTER — Ambulatory Visit (HOSPITAL_COMMUNITY): Payer: Commercial Managed Care - PPO | Admitting: *Deleted

## 2018-12-25 ENCOUNTER — Other Ambulatory Visit (HOSPITAL_COMMUNITY): Payer: Self-pay | Admitting: *Deleted

## 2018-12-25 ENCOUNTER — Encounter (HOSPITAL_COMMUNITY): Payer: Self-pay

## 2018-12-25 ENCOUNTER — Other Ambulatory Visit: Payer: Self-pay

## 2018-12-25 VITALS — BP 131/87 | HR 91 | Wt 239.0 lb

## 2018-12-25 VITALS — BP 119/93 | HR 118 | Temp 98.4°F

## 2018-12-25 DIAGNOSIS — I10 Essential (primary) hypertension: Secondary | ICD-10-CM

## 2018-12-25 DIAGNOSIS — Z3A29 29 weeks gestation of pregnancy: Secondary | ICD-10-CM

## 2018-12-25 DIAGNOSIS — O0993 Supervision of high risk pregnancy, unspecified, third trimester: Secondary | ICD-10-CM

## 2018-12-25 DIAGNOSIS — O360130 Maternal care for anti-D [Rh] antibodies, third trimester, not applicable or unspecified: Secondary | ICD-10-CM

## 2018-12-25 DIAGNOSIS — O163 Unspecified maternal hypertension, third trimester: Secondary | ICD-10-CM

## 2018-12-25 DIAGNOSIS — O139 Gestational [pregnancy-induced] hypertension without significant proteinuria, unspecified trimester: Secondary | ICD-10-CM

## 2018-12-25 DIAGNOSIS — O133 Gestational [pregnancy-induced] hypertension without significant proteinuria, third trimester: Secondary | ICD-10-CM

## 2018-12-25 DIAGNOSIS — O0990 Supervision of high risk pregnancy, unspecified, unspecified trimester: Secondary | ICD-10-CM

## 2018-12-25 DIAGNOSIS — Z363 Encounter for antenatal screening for malformations: Secondary | ICD-10-CM | POA: Diagnosis not present

## 2018-12-25 IMAGING — US US MFM OB DETAIL +14 WK
1 series · 13 of 28 positions shown · non-contrast
Comparison: none

[Series 1: us mfm ob detail +14 wk · 58 acquisitions, 13 frames shown]
[im 3/58]
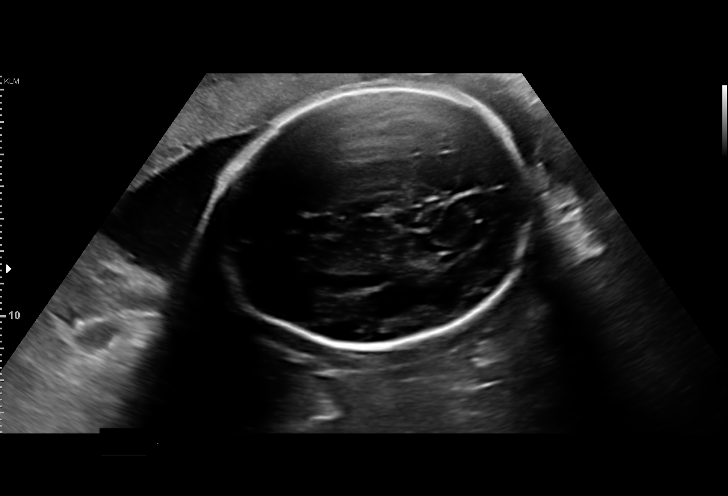
[im 7/58]
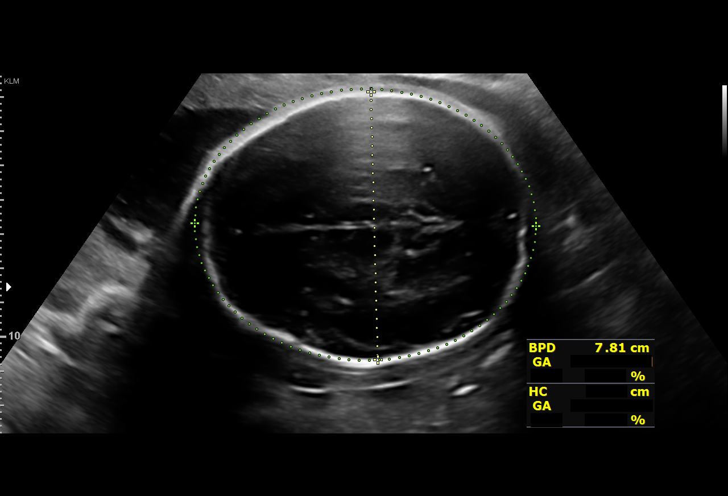
[im 11/58]
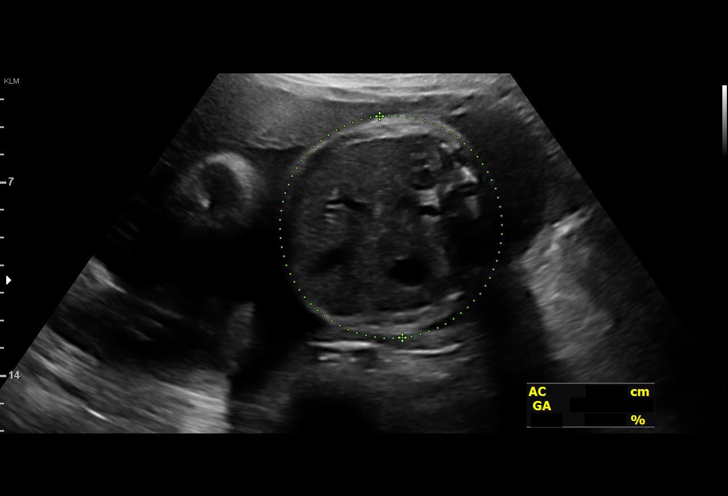
[im 15/58]
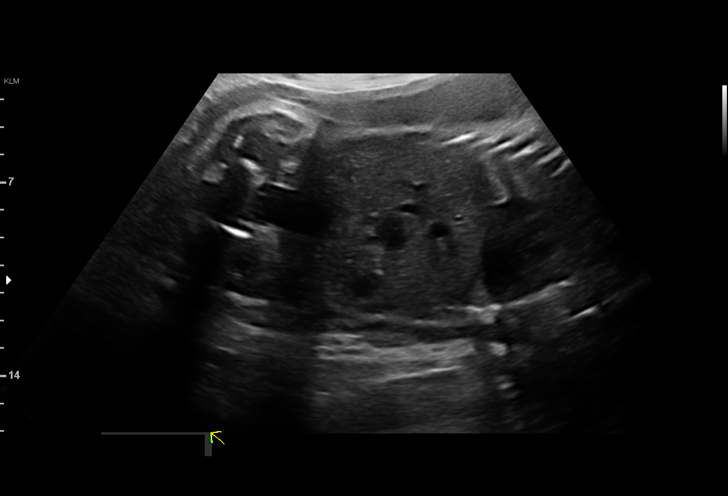
[im 20/58]
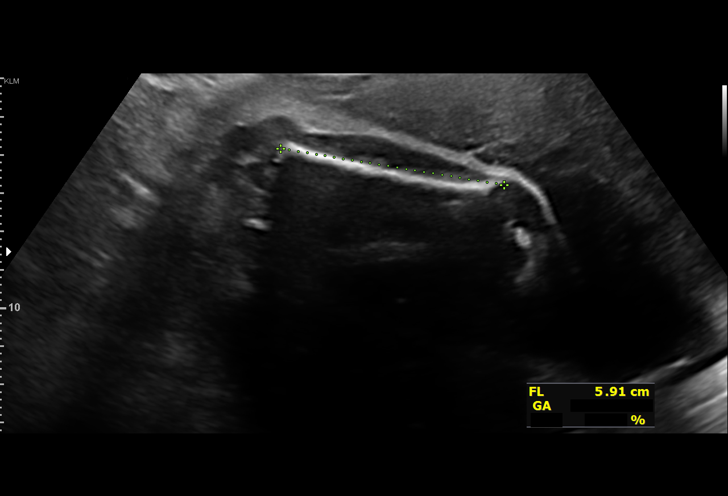
[im 24/58]
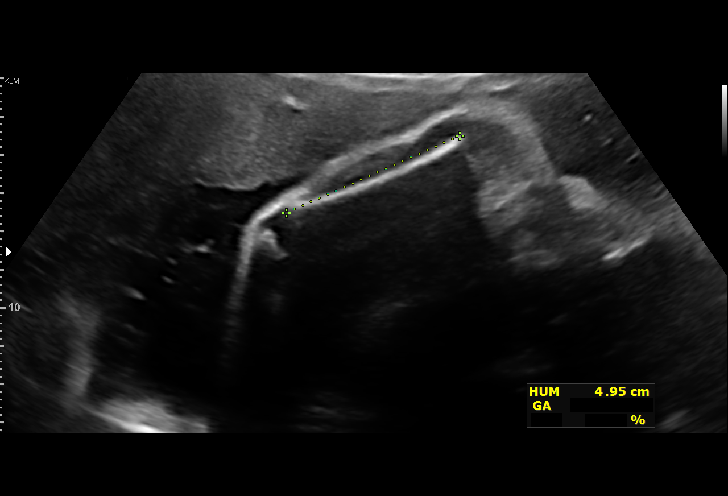
[im 30/58]
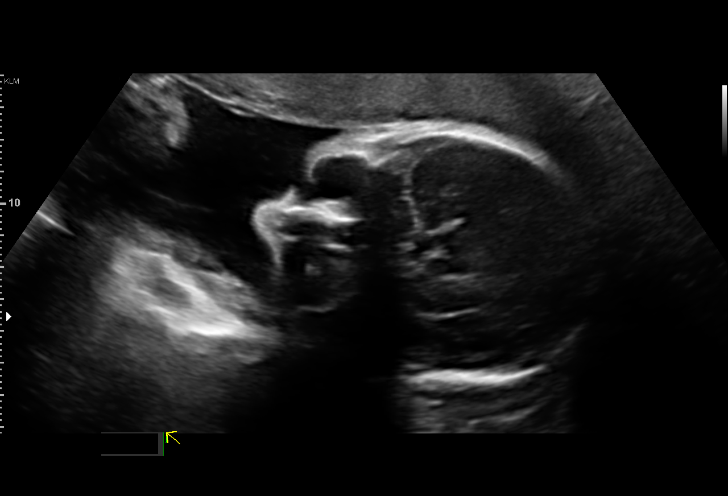
[im 34/58]
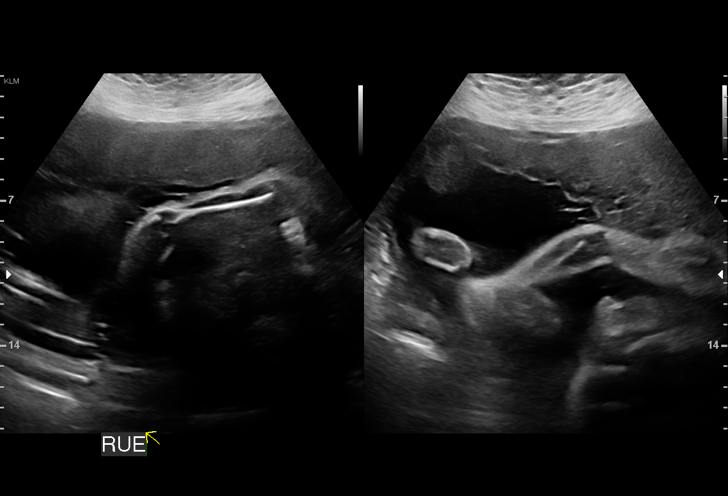
[im 39/58]
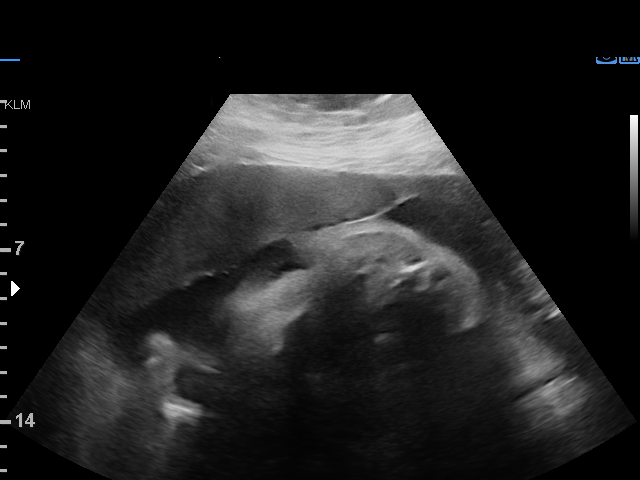
[im 43/58]
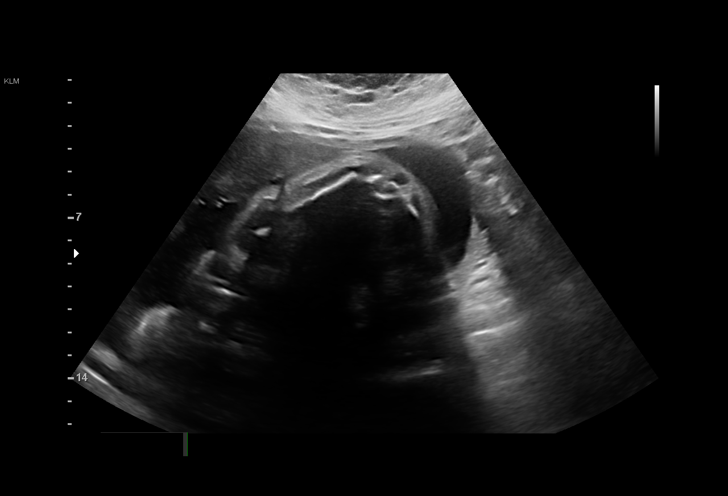
[im 47/58]
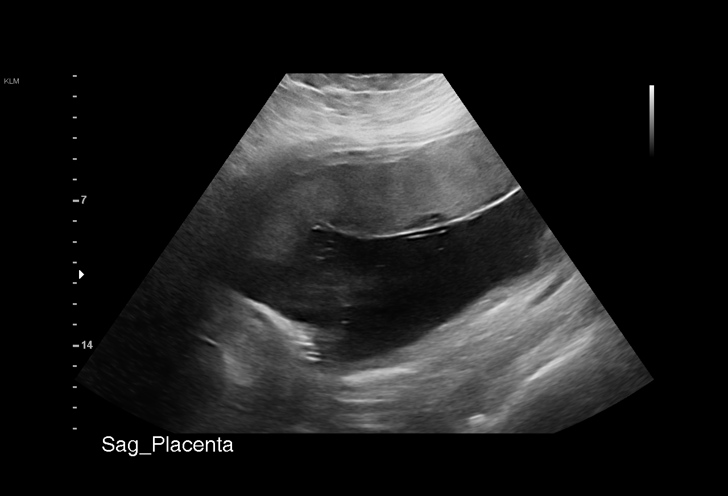
[im 51/58]
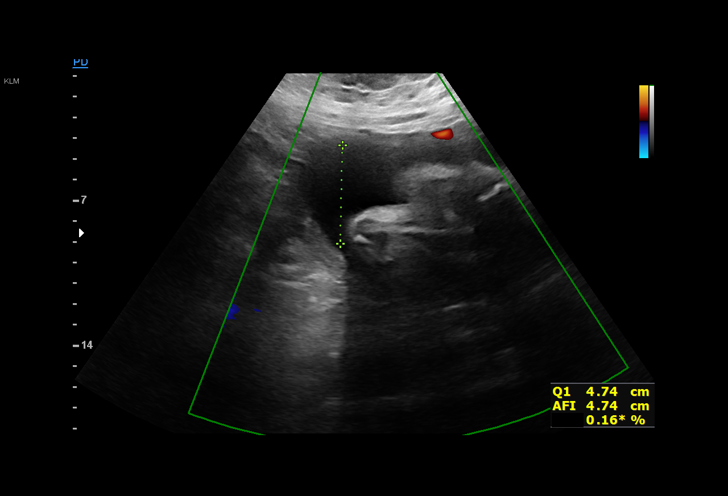
[im 55/58]
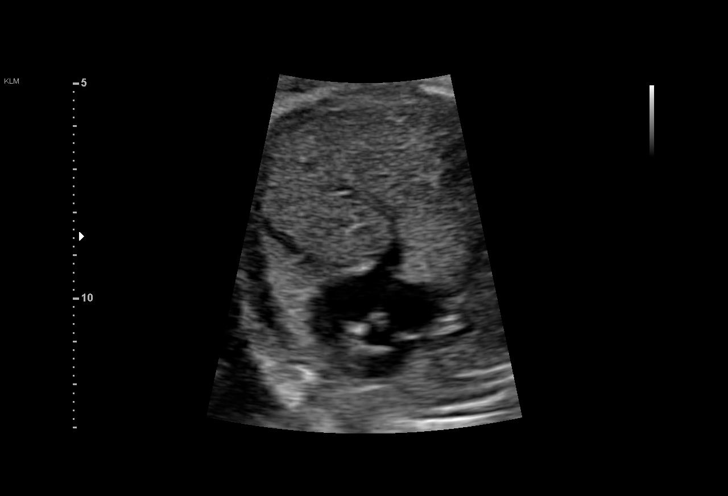

[13 of 28 positions shown; findings below may reference images not displayed]

Attending:        DANG        Address:          [REDACTED]
                   [TR]

 ----------------------------------------------------------------------

 ----------------------------------------------------------------------
Indications

  Encounter for antenatal screening for          [TR]
  malformations
  29 weeks gestation of pregnancy
  Hypertension - Gestational (ASA)               [TR]
  Maternal care for anti-D Rh antibodies,      [TR]
  third trimester, unspecified (too weak to titer)
 ----------------------------------------------------------------------
Fetal Evaluation

 Num Of Fetuses:         1
 Fetal Heart Rate(bpm):  148
 Cardiac Activity:       Observed
 Presentation:           Cephalic
 Placenta:               Anterior
 P. Cord Insertion:      Visualized

 Amniotic Fluid
 AFI FV:      Within normal limits

 AFI Sum(cm)     %Tile       Largest Pocket(cm)
 15.16           53

 RUQ(cm)       RLQ(cm)       LUQ(cm)        LLQ(cm)

Biometry
 BPD:      78.1  mm     G. Age:  31w 2d         82  %    CI:        76.74   %    70 - 86
                                                         FL/HC:      19.5   %    19.2 -
 HC:      282.4  mm     G. Age:  31w 0d         47  %    HC/AC:      1.12        0.99 -
 AC:      252.2  mm     G. Age:  29w 3d         32  %    FL/BPD:     70.7   %    71 - 87
 FL:       55.2  mm     G. Age:  29w 1d         18  %    FL/AC:      21.9   %    20 - 24
 HUM:      49.5  mm     G. Age:  29w 1d         35  %

 Est. FW:    [TR]  gm      3 lb 2 oz     48  %
OB History

 Gravidity:    2         Term:   0        Prem:   0        SAB:   1
 TOP:          0       Ectopic:  0        Living: 0
Gestational Age

 LMP:           29w 6d        Date:  [DATE]                 EDD:   [DATE]
 U/S Today:     30w 2d                                        EDD:   [DATE]
 Best:          29w 6d     Det. By:  LMP  ([DATE])          EDD:   [DATE]
Anatomy

 Cranium:               Appears normal         Aortic Arch:            Appears normal
 Cavum:                 Appears normal         Ductal Arch:            Not well visualized
 Ventricles:            Appears normal         Diaphragm:              Appears normal
 Choroid Plexus:        Appears normal         Stomach:                Appears normal, left
                                                                       sided
 Cerebellum:            Appears normal         Abdomen:                Appears normal
 Posterior Fossa:       Appears normal         Abdominal Wall:         Not well visualized
 Nuchal Fold:           Not applicable (>20    Cord Vessels:           Appears normal (3
                        wks GA)                                        vessel cord)
 Face:                  Orbits nl; profile not Kidneys:                Appear normal
                        well visualized
 Lips:                  Appears normal         Bladder:                Appears normal
 Thoracic:              Appears normal         Spine:                  Appears normal
 Heart:                 Not well visualized    Upper Extremities:      Visualized
 RVOT:                  Appears normal         Lower Extremities:      Appears normal
 LVOT:                  Not well visualized

 Other:  Technically difficult due to advanced gestational age.
Cervix Uterus Adnexa

 Cervix
 Not visualized (advanced GA >[TR])
Impression

 Patient has a diagnosis of gestational hypertension.
 Protein/creatinine ratio and labs including liver enzymes and
 platelets are within normal limits. She does not have
 gestational diabetes.
 She does not have symptoms of severe preeclampsia.
 Fetal growth is appropriate for gestational age. Amniotic fluid
 is normal and good fetal activity is seen. Fetal anatomy
 appears normal, but limited by advanced gestational age.

 We reassured the patient of the findings.
Recommendations

 -Weekly BPP from next visit till delivery.
                 DANG

## 2018-12-25 NOTE — Progress Notes (Signed)
PRENATAL VISIT NOTE  Subjective:  Madison Cook is a 23 y.o. G2P0010 at [redacted]w[redacted]d being seen today for ongoing prenatal care.  She is currently monitored for the following issues for this high-risk pregnancy and has Gestational hypertension; BMI 40.0-44.9, adult (HCC); Obesity in pregnancy; Supervision of high risk pregnancy, antepartum; Rh negative state in antepartum period; and History of influenza on their problem list.  Patient reports no complaints.  Contractions: Not present. Vag. Bleeding: None.  Movement: Present. Denies leaking of fluid.   The following portions of the patient's history were reviewed and updated as appropriate: allergies, current medications, past family history, past medical history, past social history, past surgical history and problem list.   Objective:   Vitals:   12/25/18 1527  BP: 131/87  Pulse: 91  Weight: 239 lb (108.4 kg)    Fetal Status: Fetal Heart Rate (bpm): 143   Movement: Present     General:  Alert, oriented and cooperative. Patient is in no acute distress.  Skin: Skin is warm and dry. No rash noted.   Cardiovascular: Normal heart rate noted  Respiratory: Normal respiratory effort, no problems with respiration noted  Abdomen: Soft, gravid, appropriate for gestational age.  Pain/Pressure: Absent     Pelvic: Cervical exam deferred        Extremities: Normal range of motion.  Edema: Trace  Mental Status: Normal mood and affect. Normal behavior. Normal judgment and thought content.   Imaging: [place] Mfm Ob Detail +14 Wk  Result Date: 12/25/2018 ----------------------------------------------------------------------  OBSTETRICS REPORT                       (Signed Final 12/25/2018 01:36 pm) ---------------------------------------------------------------------- Patient Info  ID #:       [id number]                          D.O.B.:  Oct 28, 1995 (22 yrs)  Name:       Madison Cook                Visit Date: 12/25/2018 12:27 pm  ---------------------------------------------------------------------- Performed By  Performed By:     Marcellina Millin          Secondary Phy.:   [facility]                                                             [facility]                                                             Healthcare  Attending:  Noralee Space MD        Address:          [address]                                                             [address]  Referred By:      Billey Gosling                Location:         [facility]                               [facility]  Ref. Address:     [address]                    [place], [place]                    [address] ---------------------------------------------------------------------- Orders   #  Description                          Code         Ordered By   1  [place] MFM OB DETAIL +14 WK              [id number]     [place] Bing  ----------------------------------------------------------------------   #  Order #                    Accession #                 Episode #   1  [id number]                  [id number]                  [id number]  ---------------------------------------------------------------------- Indications   Encounter for antenatal screening for          Z36.3   malformations   [redacted] weeks gestation of pregnancy                Z3A.29   Hypertension - Gestational (ASA)               O13.9   Maternal 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 Cord Insertion:      Visualized  Amniotic Fluid  AFI FV:      Within normal limits  AFI  Sum(cm)     %Tile       Largest Pocket(cm)  15.16           53          5.13  RUQ(cm)       RLQ(cm)       LUQ(cm)        LLQ(cm)  4.74          2.93          5.13           2.36 ---------------------------------------------------------------------- Biometry  BPD:      78.1  mm     G. Age:  31w 2d         82  %    CI:        76.74   %    70 - 86                                                          FL/HC:      19.5   %    19.2 - 21.4  HC:      282.4  mm     G. Age:  31w 0d         47  %    HC/AC:      1.12        0.99 - 1.21  AC:      252.2  mm     G. Age:  29w 3d         32  %    FL/BPD:     70.7   %    71 - 87  FL:       55.2  mm     G. Age:  29w 1d         18  %    FL/AC:      21.9   %    20 - 24  HUM:      49.5  mm     G. Age:  29w 1d         35  %  Est. FW:    1422  gm      3 lb 2 oz     48  % ---------------------------------------------------------------------- OB History  Gravidity:    2         Term:   0        Prem:   0        SAB:   1  TOP:          0       Ectopic:  0        Living: 0 ---------------------------------------------------------------------- Gestational Age  LMP:           29w 6d        Date:  05/30/18                 EDD:   03/06/19  U/S Today:     30w 2d                                        EDD:   03/03/19  Best:          29w 6d     Det. By:  LMP  (05/30/18)          EDD:   03/06/19 ---------------------------------------------------------------------- Anatomy  Cranium:               Appears normal         Aortic Arch:            Appears normal  Cavum:                 Appears normal         Ductal Arch:            Not well visualized  Ventricles:            Appears normal         Diaphragm:              Appears normal  Choroid Plexus:        Appears normal         Stomach:                Appears normal, left                                                                        sided  Cerebellum:            Appears normal         Abdomen:                Appears normal  Posterior Fossa:        Appears normal         Abdominal Wall:         Not well visualized  Nuchal Fold:           Not applicable (>20    Cord Vessels:           Appears normal ([redacted]                         wks GA)                                        vessel cord)  Face:                  Orbits nl; profile not Kidneys:                Appear normal                         well visualized  Lips:                  Appears normal         Bladder:                Appears normal  Thoracic:              Appears normal         Spine:                  Appears normal  Heart:  Not well visualized    Upper Extremities:      Visualized  RVOT:                  Appears normal         Lower Extremities:      Appears normal  LVOT:                  Not well visualized  Other:  Technically difficult due to advanced gestational age. ---------------------------------------------------------------------- Cervix Uterus Adnexa  Cervix  Not visualized (advanced GA >24wks) ---------------------------------------------------------------------- Impression  Patient has a diagnosis of gestational hypertension.  Protein/creatinine ratio and labs including liver enzymes and  platelets are within normal limits. She does not have  gestational diabetes.  She does not have symptoms of severe preeclampsia.  Fetal growth is appropriate for gestational age. Amniotic fluid  is normal and good fetal activity is seen. Fetal anatomy  appears normal, but limited by advanced gestational age.  We reassured the patient of the findings. ---------------------------------------------------------------------- Recommendations  -Weekly BPP from next visit till delivery. ----------------------------------------------------------------------                  Noralee Space, MD Electronically Signed Final Report   12/25/2018 01:36 pm ----------------------------------------------------------------------   Assessment and Plan:  Pregnancy: G2P0010 at [redacted]w[redacted]d 1. Gestational  hypertension, third trimester Stable BP, no symptoms.  She was advised to check her BP next week and send it via MyChart. Will start antenatal testing next visit, IOL at 37 weeks. Already scheduled for MFM scans.  2. Supervision of high risk pregnancy, antepartum Preterm labor symptoms and general obstetric precautions including but not limited to vaginal bleeding, contractions, leaking of fluid and fetal movement were reviewed in detail with the patient. Please refer to After Visit Summary for other counseling recommendations.   Return in about 2 weeks (around 01/08/2019) for Tdap, NST, OB Visit. Will need weekly OB and NST visits after this.  Future Appointments  Date Time Provider Department Center  01/08/2019  2:30 PM Allie Bossier, MD [facility]  01/08/2019  4:00 PM [facility] [place] 3 [facility]  01/15/2019  1:00 PM [facility] [place] [facility]  01/22/2019  1:00 PM [facility] [place] 3 [facility]    Jaynie Collins, MD

## 2018-12-25 NOTE — Patient Instructions (Signed)
Thank you for enrolling in MyChart. Please follow the instructions below to securely access your online medical record. MyChart allows you to send messages to your doctor, view your test results, manage appointments, and more.   How Do I Sign Up? 1. In your Internet browser, go to [employer] and enter [email or web address]. 2. Click on the Sign Up Now link in the Sign In box. You will see the New Member Sign Up page. 3. Enter your MyChart Access Code exactly as it appears below. You will not need to use this code after you've completed the sign-up process. If you do not sign up before the expiration date, you must request a new code.  MyChart Access Code: [email or web address] Expires: 01/31/2019  1:52 PM  4. Enter your Social Security Number ([email or web address]) and Date of Birth (mm/dd/yyyy) as indicated and click Submit. You will be taken to the next sign-up page. 5. Create a MyChart ID. This will be your MyChart login ID and cannot be changed, so think of one that is secure and easy to remember. 6. Create a MyChart password. You can change your password at any time. 7. Enter your Password Reset Question and Answer. This can be used at a later time if you forget your password.  8. Enter your e-mail address. You will receive e-mail notification when new information is available in MyChart. 9. Click Sign Up. You can now view your medical record.   Additional Information Remember, MyChart is NOT to be used for urgent needs. For medical emergencies, dial 911.

## 2019-01-03 ENCOUNTER — Telehealth: Payer: Self-pay | Admitting: Radiology

## 2019-01-03 NOTE — Telephone Encounter (Signed)
Left message for pt to call [facility] to move afternoon appointment with Dr Marice Potter to the morning, on 01/08/19

## 2019-01-08 ENCOUNTER — Encounter (HOSPITAL_COMMUNITY): Payer: Self-pay

## 2019-01-08 ENCOUNTER — Ambulatory Visit (HOSPITAL_COMMUNITY): Payer: Commercial Managed Care - PPO

## 2019-01-08 ENCOUNTER — Ambulatory Visit (INDEPENDENT_AMBULATORY_CARE_PROVIDER_SITE_OTHER): Payer: Commercial Managed Care - PPO | Admitting: Obstetrics & Gynecology

## 2019-01-08 ENCOUNTER — Other Ambulatory Visit: Payer: Self-pay

## 2019-01-08 ENCOUNTER — Encounter: Payer: Commercial Managed Care - PPO | Admitting: Obstetrics & Gynecology

## 2019-01-08 VITALS — BP 126/87 | HR 96 | Wt 239.0 lb

## 2019-01-08 DIAGNOSIS — O133 Gestational [pregnancy-induced] hypertension without significant proteinuria, third trimester: Secondary | ICD-10-CM | POA: Diagnosis not present

## 2019-01-08 DIAGNOSIS — Z23 Encounter for immunization: Secondary | ICD-10-CM | POA: Diagnosis not present

## 2019-01-08 DIAGNOSIS — O0993 Supervision of high risk pregnancy, unspecified, third trimester: Secondary | ICD-10-CM

## 2019-01-08 DIAGNOSIS — O0990 Supervision of high risk pregnancy, unspecified, unspecified trimester: Secondary | ICD-10-CM

## 2019-01-08 DIAGNOSIS — O99210 Obesity complicating pregnancy, unspecified trimester: Secondary | ICD-10-CM

## 2019-01-08 DIAGNOSIS — Z3A31 31 weeks gestation of pregnancy: Secondary | ICD-10-CM

## 2019-01-08 DIAGNOSIS — O99213 Obesity complicating pregnancy, third trimester: Secondary | ICD-10-CM

## 2019-01-08 LAB — OB RESULTS CONSOLE HEPATITIS B SURFACE ANTIGEN: Hepatitis B Surface Ag: NEGATIVE

## 2019-01-08 LAB — OB RESULTS CONSOLE RUBELLA ANTIBODY, IGM: Rubella: IMMUNE

## 2019-01-08 NOTE — Progress Notes (Signed)
   PRENATAL VISIT NOTE  Subjective:  Madison Cook is a 23 y.o. G2P0010 at [redacted]w[redacted]d being seen today for ongoing prenatal care.  She is currently monitored for the following issues for this high-risk pregnancy and has Gestational hypertension; BMI 40.0-44.9, adult (HCC); Obesity in pregnancy; Supervision of high risk pregnancy, antepartum; Rh negative state in antepartum period; and History of influenza on their problem list.  Patient reports no complaints.  Contractions: Not present. Vag. Bleeding: None.  Movement: Present. Denies leaking of fluid.   The following portions of the patient's history were reviewed and updated as appropriate: allergies, current medications, past family history, past medical history, past social history, past surgical history and problem list.   Objective:   Vitals:   01/08/19 1033  BP: 126/87  Pulse: 96  Weight: 239 lb (108.4 kg)    Fetal Status: Fetal Heart Rate (bpm): NST   Movement: Present     General:  Alert, oriented and cooperative. Patient is in no acute distress.  Skin: Skin is warm and dry. No rash noted.   Cardiovascular: Normal heart rate noted  Respiratory: Normal respiratory effort, no problems with respiration noted  Abdomen: Soft, gravid, appropriate for gestational age.  Pain/Pressure: Absent     Pelvic: Cervical exam deferred        Extremities: Normal range of motion.  Edema: None  Mental Status: Normal mood and affect. Normal behavior. Normal judgment and thought content.   Assessment and Plan:  Pregnancy: G2P0010 at [redacted]w[redacted]d 1. Supervision of high risk pregnancy, antepartum   2. Gestational hypertension, third trimester - good BP on no meds except baby asa - weekly BPP, IOL at 37 weeks  3. Obesity in pregnancy   Preterm labor symptoms and general obstetric precautions including but not limited to vaginal bleeding, contractions, leaking of fluid and fetal movement were reviewed in detail with the patient. Please refer to After  Visit Summary for other counseling recommendations.   Return for weekly NST.  Future Appointments  Date Time Provider Department Center  01/15/2019  1:00 PM [facility] NURSE [facility]  01/15/2019  1:00 PM [facility] [place] 3 [facility]  01/22/2019  1:00 PM [facility] NURSE [facility]  01/22/2019  1:00 PM [facility] [place] 3 [facility]    Allie Bossier, MD

## 2019-01-15 ENCOUNTER — Other Ambulatory Visit: Payer: Commercial Managed Care - PPO

## 2019-01-15 ENCOUNTER — Telehealth: Payer: Self-pay | Admitting: Radiology

## 2019-01-15 ENCOUNTER — Other Ambulatory Visit (HOSPITAL_COMMUNITY): Payer: Commercial Managed Care - PPO

## 2019-01-15 ENCOUNTER — Ambulatory Visit (HOSPITAL_COMMUNITY): Payer: Commercial Managed Care - PPO | Attending: Obstetrics and Gynecology

## 2019-01-15 NOTE — Telephone Encounter (Signed)
Left message for patient to call [facility], missed NST appt 01/15/19, need to reschedule

## 2019-01-16 ENCOUNTER — Ambulatory Visit (INDEPENDENT_AMBULATORY_CARE_PROVIDER_SITE_OTHER): Payer: Commercial Managed Care - PPO | Admitting: *Deleted

## 2019-01-16 ENCOUNTER — Other Ambulatory Visit: Payer: Self-pay

## 2019-01-16 ENCOUNTER — Encounter (HOSPITAL_COMMUNITY): Payer: Self-pay

## 2019-01-16 ENCOUNTER — Ambulatory Visit (HOSPITAL_COMMUNITY)
Admission: RE | Admit: 2019-01-16 | Discharge: 2019-01-16 | Disposition: A | Discharge status: Home / Self Care | Payer: Medicaid Other | Source: Ambulatory Visit | Attending: Obstetrics and Gynecology | Admitting: Obstetrics and Gynecology

## 2019-01-16 ENCOUNTER — Ambulatory Visit (HOSPITAL_COMMUNITY): Payer: Medicaid Other | Admitting: *Deleted

## 2019-01-16 VITALS — BP 137/93 | HR 97 | Wt 242.0 lb

## 2019-01-16 VITALS — BP 133/94 | HR 88 | Temp 98.3°F | Wt 242.0 lb

## 2019-01-16 DIAGNOSIS — O139 Gestational [pregnancy-induced] hypertension without significant proteinuria, unspecified trimester: Secondary | ICD-10-CM | POA: Diagnosis present

## 2019-01-16 DIAGNOSIS — Z3A33 33 weeks gestation of pregnancy: Secondary | ICD-10-CM

## 2019-01-16 DIAGNOSIS — O133 Gestational [pregnancy-induced] hypertension without significant proteinuria, third trimester: Secondary | ICD-10-CM | POA: Diagnosis not present

## 2019-01-16 DIAGNOSIS — O99213 Obesity complicating pregnancy, third trimester: Secondary | ICD-10-CM

## 2019-01-16 DIAGNOSIS — Z3A32 32 weeks gestation of pregnancy: Secondary | ICD-10-CM

## 2019-01-16 DIAGNOSIS — O0990 Supervision of high risk pregnancy, unspecified, unspecified trimester: Secondary | ICD-10-CM

## 2019-01-16 DIAGNOSIS — O360130 Maternal care for anti-D [Rh] antibodies, third trimester, not applicable or unspecified: Secondary | ICD-10-CM

## 2019-01-16 NOTE — Progress Notes (Signed)
Pt here for NST, strip was reassuring but not reactive. Reviewed by Dr Alvester Morin, pt to have BPP today as well.

## 2019-01-21 NOTE — Progress Notes (Signed)
Attestation of Attending Supervision of clinical support staff: I agree with the care provided to this patient and was available for any consultation.  I have reviewed the RNs note and chart, and I agree with the management and plan. I was consulted at the time of visit and reviewed NST personally.   Federico Flake, MD, MPH, ABFM Attending Physician Faculty Practice- [facility]

## 2019-01-22 ENCOUNTER — Other Ambulatory Visit: Payer: Self-pay

## 2019-01-22 ENCOUNTER — Ambulatory Visit (HOSPITAL_COMMUNITY): Payer: Medicaid Other | Admitting: *Deleted

## 2019-01-22 ENCOUNTER — Encounter (HOSPITAL_COMMUNITY): Payer: Self-pay | Admitting: *Deleted

## 2019-01-22 ENCOUNTER — Ambulatory Visit (HOSPITAL_COMMUNITY)
Admission: RE | Admit: 2019-01-22 | Discharge: 2019-01-22 | Disposition: A | Discharge status: Home / Self Care | Payer: Medicaid Other | Source: Ambulatory Visit | Attending: Obstetrics and Gynecology | Admitting: Obstetrics and Gynecology

## 2019-01-22 ENCOUNTER — Other Ambulatory Visit (HOSPITAL_COMMUNITY): Payer: Self-pay | Admitting: *Deleted

## 2019-01-22 ENCOUNTER — Encounter: Payer: Commercial Managed Care - PPO | Admitting: Obstetrics & Gynecology

## 2019-01-22 VITALS — BP 135/94 | HR 96 | Temp 98.6°F

## 2019-01-22 DIAGNOSIS — O139 Gestational [pregnancy-induced] hypertension without significant proteinuria, unspecified trimester: Secondary | ICD-10-CM

## 2019-01-22 DIAGNOSIS — Z362 Encounter for other antenatal screening follow-up: Secondary | ICD-10-CM

## 2019-01-22 DIAGNOSIS — O360130 Maternal care for anti-D [Rh] antibodies, third trimester, not applicable or unspecified: Secondary | ICD-10-CM | POA: Diagnosis not present

## 2019-01-22 DIAGNOSIS — O133 Gestational [pregnancy-induced] hypertension without significant proteinuria, third trimester: Secondary | ICD-10-CM | POA: Diagnosis not present

## 2019-01-22 DIAGNOSIS — O99213 Obesity complicating pregnancy, third trimester: Secondary | ICD-10-CM

## 2019-01-22 DIAGNOSIS — Z3A33 33 weeks gestation of pregnancy: Secondary | ICD-10-CM

## 2019-01-22 IMAGING — US US MFM FETAL BPP WO NON STRESS
1 series · 14 of 28 positions shown · non-contrast
Comparison: none

[Series 1: us mfm fetal bpp wo non stress · 38 acquisitions, 14 frames shown]
[im 2/38]
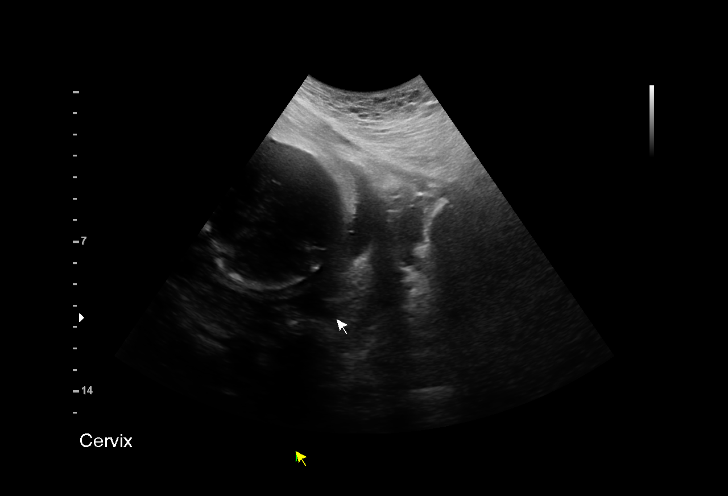
[im 5/38]
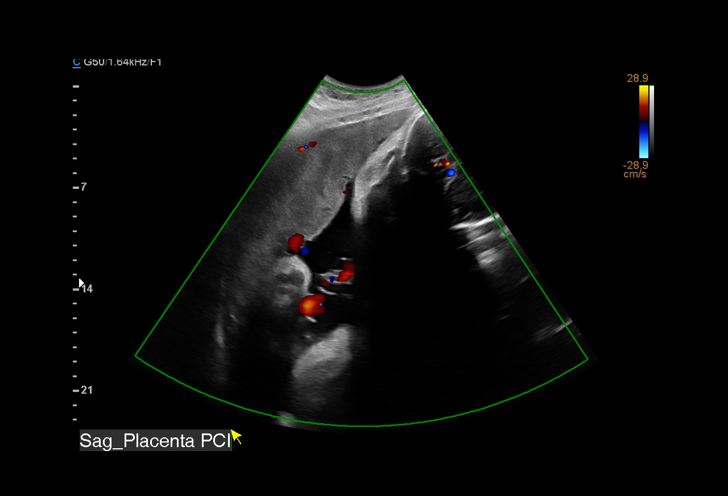
[im 7/38]
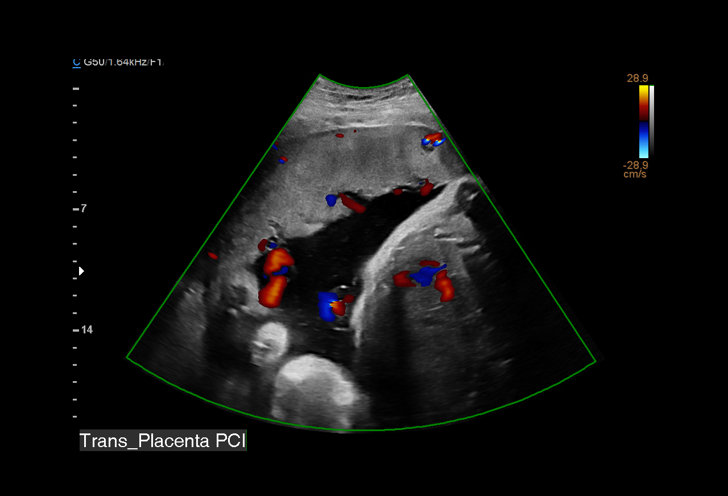
[im 10/38]
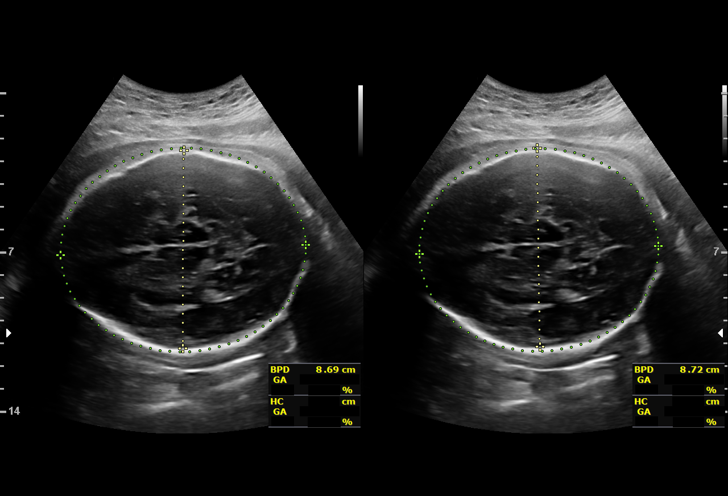
[im 13/38]
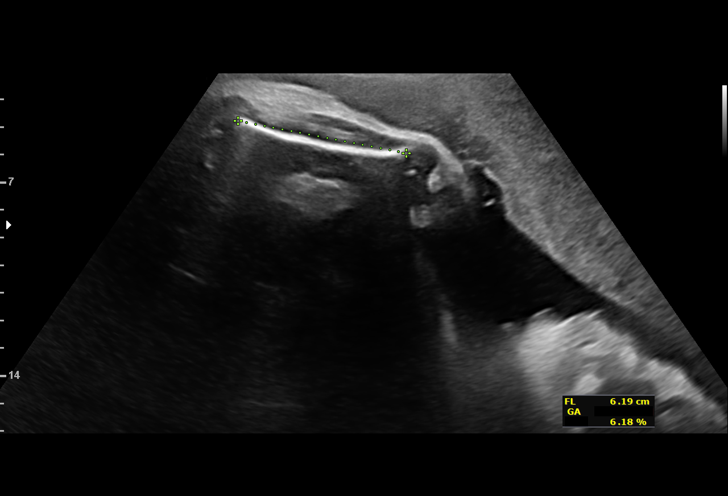
[im 16/38]
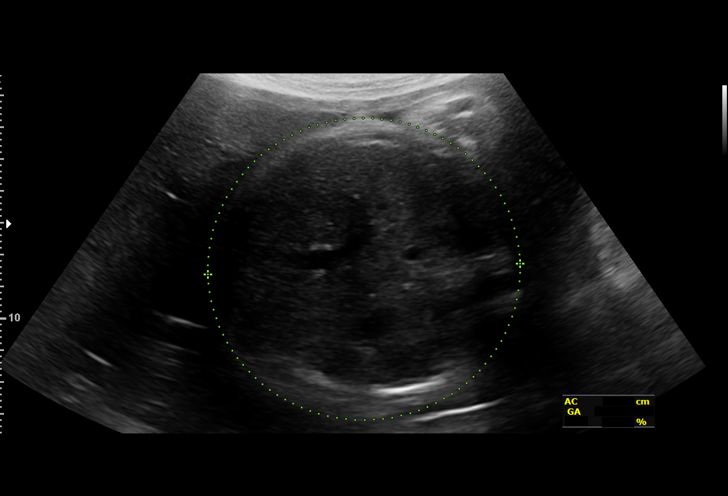
[im 18/38]
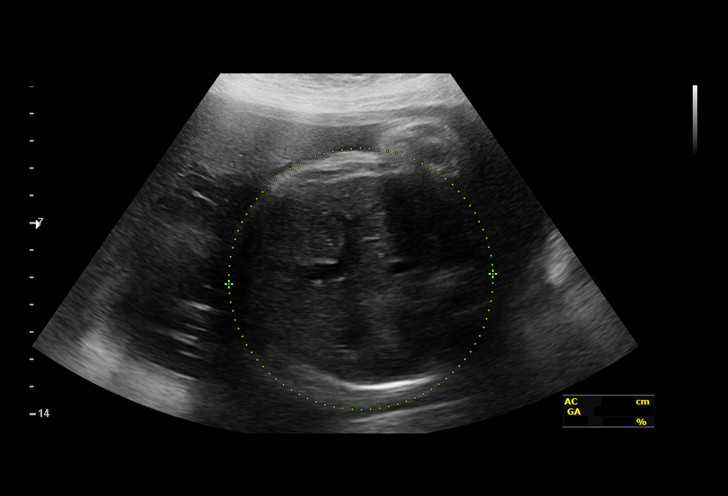
[im 21/38]
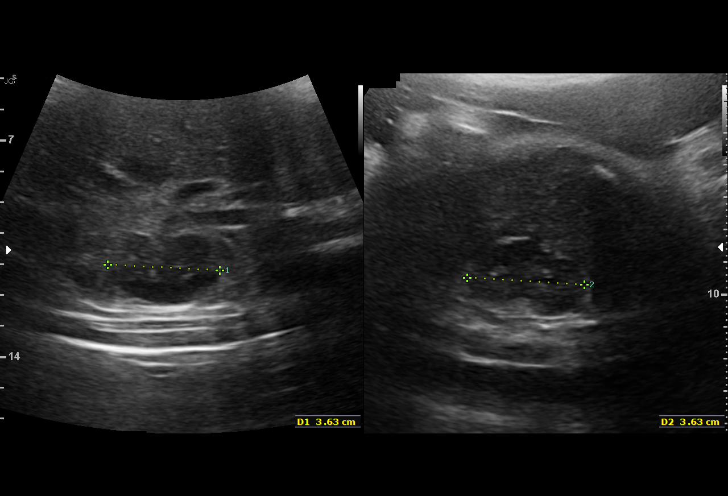
[im 24/38]
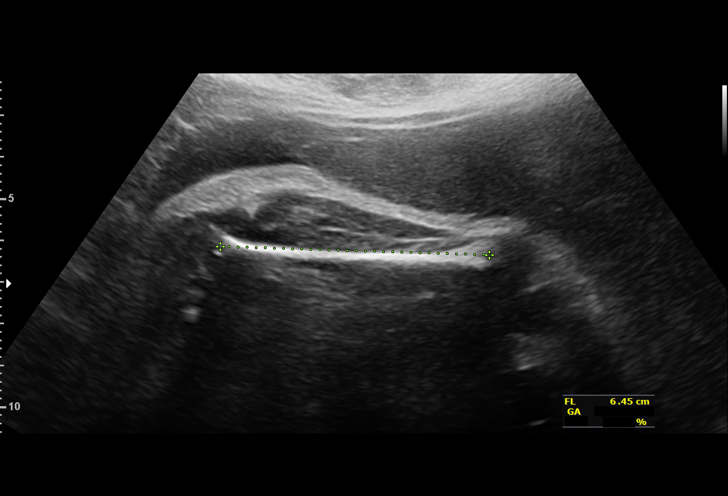
[im 27/38]
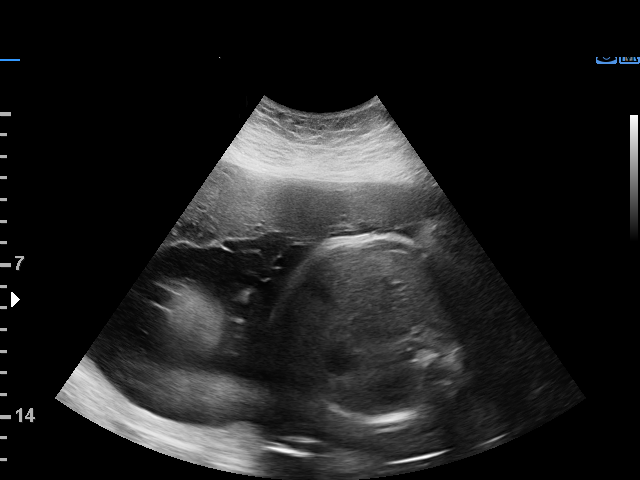
[im 29/38]
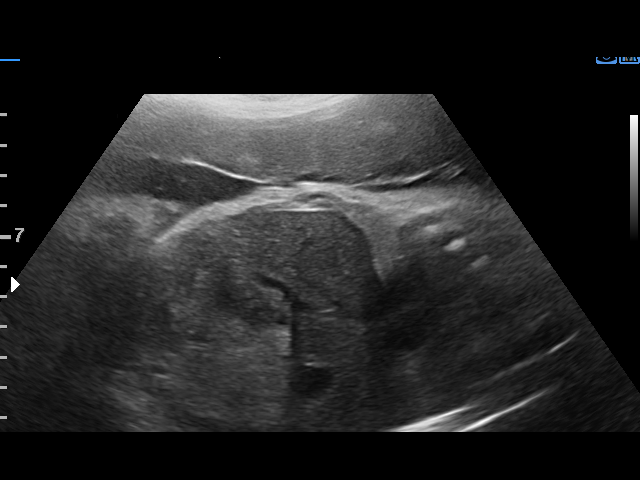
[im 32/38]
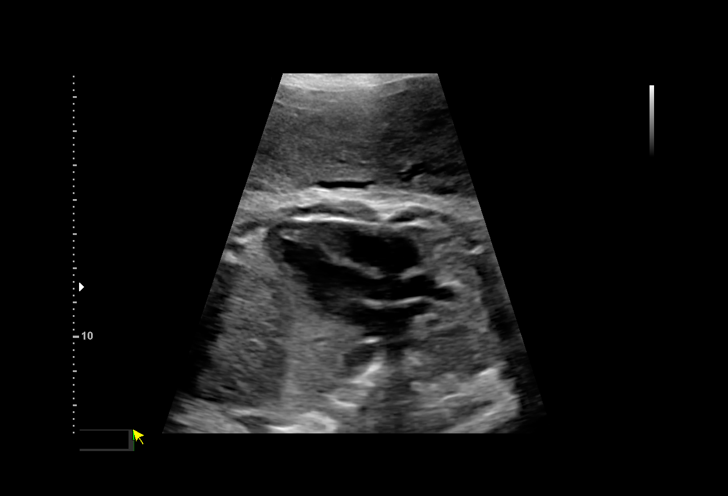
[im 35/38]
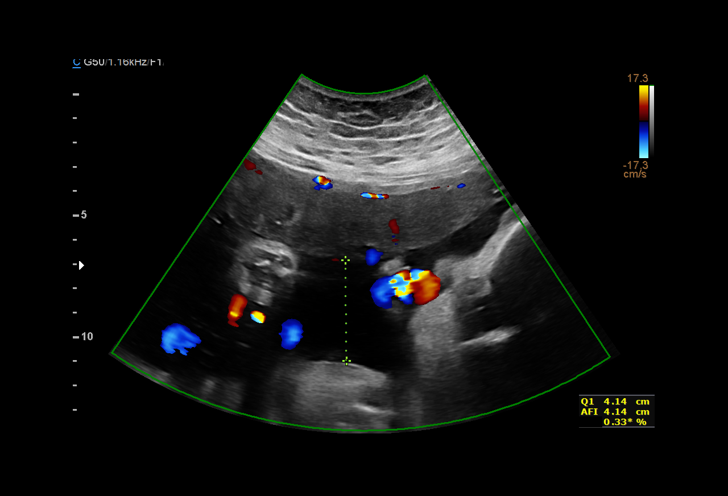
[im 38/38]
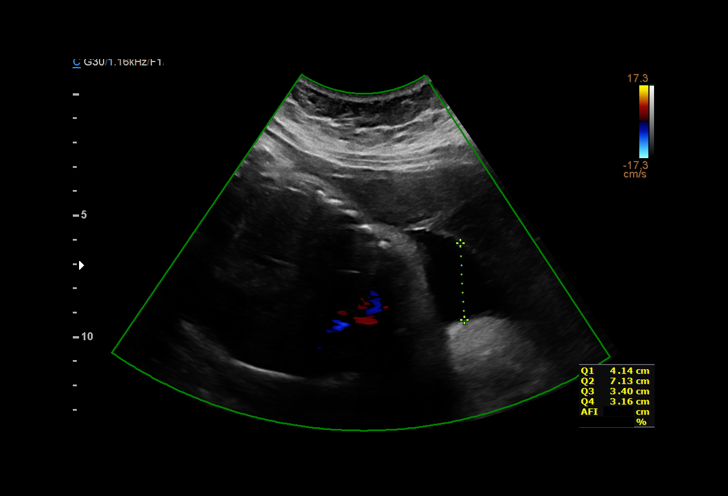

[14 of 28 positions shown; findings below may reference images not displayed]

[EF]

 ----------------------------------------------------------------------

 ----------------------------------------------------------------------
Indications

  Hypertension - Gestational (ASA)               [EF]
  Maternal care for anti-D Rh antibodies,      [EF]
  third trimester, unspecified (too weak to titer)
  Obesity complicating pregnancy, third          [EF]
  trimester [EF]
  33 weeks gestation of pregnancy
 ----------------------------------------------------------------------
Vital Signs

 BMI:
Fetal Evaluation

 Num Of Fetuses:         1
 Fetal Heart Rate(bpm):  133
 Cardiac Activity:       Observed
 Presentation:           Cephalic
 Placenta:               Anterior
 P. Cord Insertion:      Previously Visualized

 Amniotic Fluid
 AFI FV:      Within normal limits

 AFI Sum(cm)     %Tile
 17.83           65
Biophysical Evaluation

 Amniotic F.V:   Within normal limits       F. Tone:        Observed
 F. Movement:    Observed                   Score:          [DATE]
 F. Breathing:   Observed
Biometry

 BPD:      87.2  mm     G. Age:  35w 1d         83  %    CI:         81.2   %    70 - 86
                                                         FL/HC:      21.1   %    19.4 -
 HC:      305.5  mm     G. Age:  34w 0d         20  %    HC/AC:      1.01        0.96 -
 AC:      302.6  mm     G. Age:  34w 2d         64  %    FL/BPD:     74.0   %    71 - 87
 FL:       64.5  mm     G. Age:  33w 2d         25  %    FL/AC:      21.3   %    20 - 24

 Est. FW:    [EF]  gm      5 lb 2 oz     63  %
OB History

 Gravidity:    2         Term:   0        Prem:   0        SAB:   1
 TOP:          0       Ectopic:  0        Living: 0
Gestational Age

 LMP:           33w 6d        Date:  [DATE]                 EDD:   [DATE]
 U/S Today:     34w 1d                                        EDD:   [DATE]
 Best:          33w 6d     Det. By:  LMP  ([DATE])          EDD:   [DATE]
Anatomy

 Cranium:               Appears normal         Aortic Arch:            Appears normal
 Cavum:                 Appears normal         Ductal Arch:            Not well visualized
 Ventricles:            Appears normal         Diaphragm:              Appears normal
 Choroid Plexus:        Previously seen        Stomach:                Appears normal, left
                                                                       sided
 Cerebellum:            Previously seen        Abdomen:                Appears normal
 Posterior Fossa:       Previously seen        Abdominal Wall:         Not well visualized
 Nuchal Fold:           Not applicable (>20    Cord Vessels:           Previously seen
                        wks GA)
 Face:                  Orbits previously      Kidneys:                Appear normal
                        seen
 Lips:                  Previously seen        Bladder:                Appears normal
 Thoracic:              Appears normal         Spine:                  Previously seen
 Heart:                 Not well visualized    Upper Extremities:      Visualized
 RVOT:                  Previously seen        Lower Extremities:      Previously seen
 LVOT:                  Appears normal

 Other:  Technically difficult due to advanced gestational age.
Cervix Uterus Adnexa

 Cervix
 Not visualized (advanced GA >[EF])
Impression

 Normal interval growth.
 Biophysical profile [DATE]
Recommendations

 Continue weekly testing
 Repeat growth in 4 weeks.

## 2019-01-24 ENCOUNTER — Ambulatory Visit (INDEPENDENT_AMBULATORY_CARE_PROVIDER_SITE_OTHER): Payer: Commercial Managed Care - PPO | Admitting: Obstetrics & Gynecology

## 2019-01-24 ENCOUNTER — Encounter: Payer: Self-pay | Admitting: Obstetrics & Gynecology

## 2019-01-24 ENCOUNTER — Other Ambulatory Visit: Payer: Self-pay

## 2019-01-24 VITALS — BP 140/94 | HR 91 | Wt 242.0 lb

## 2019-01-24 DIAGNOSIS — Z3A34 34 weeks gestation of pregnancy: Secondary | ICD-10-CM

## 2019-01-24 DIAGNOSIS — O133 Gestational [pregnancy-induced] hypertension without significant proteinuria, third trimester: Secondary | ICD-10-CM

## 2019-01-24 DIAGNOSIS — O0990 Supervision of high risk pregnancy, unspecified, unspecified trimester: Secondary | ICD-10-CM

## 2019-01-24 DIAGNOSIS — O0993 Supervision of high risk pregnancy, unspecified, third trimester: Secondary | ICD-10-CM

## 2019-01-24 LAB — POCT URINALYSIS DIPSTICK
Blood, UA: NEGATIVE
Leukocytes, UA: NEGATIVE

## 2019-01-24 NOTE — Patient Instructions (Signed)
Return to office for any scheduled appointments. Call the office or go to the MAU at [facility] if:  You begin to have strong, frequent contractions  Your water breaks.  Sometimes it is a big gush of fluid, sometimes it is just a trickle that keeps getting your panties wet or running down your legs  You have vaginal bleeding.  It is normal to have a small amount of spotting if your cervix was checked.   You do not feel your baby moving like normal.  If you do not, get something to eat and drink and lay down and focus on feeling your baby move.   If your baby is still not moving like normal, you should call the office or go to MAU.  Any other obstetric concerns.   

## 2019-01-24 NOTE — Progress Notes (Signed)
PRENATAL VISIT NOTE  Subjective:  Madison Cook is a 23 y.o. G2P0010 at [redacted]w[redacted]d being seen today for ongoing prenatal care.  She is currently monitored for the following issues for this high-risk pregnancy and has Gestational hypertension; BMI 40.0-44.9, adult (HCC); Obesity in pregnancy; Supervision of high risk pregnancy, antepartum; Rh negative state in antepartum period; and History of influenza on their problem list.  Patient reports no complaints. Patient denies any headaches, visual symptoms, RUQ/epigastric pain or other concerning symptoms.  Contractions: Irritability. Vag. Bleeding: None.  Movement: Present. Denies leaking of fluid.   The following portions of the patient's history were reviewed and updated as appropriate: allergies, current medications, past family history, past medical history, past social history, past surgical history and problem list.   Objective:   Vitals:   01/24/19 1000  BP: (!) 140/94  Pulse: 91  Weight: 242 lb (109.8 kg)    Fetal Status: Fetal Heart Rate (bpm): NST   Movement: Present     General:  Alert, oriented and cooperative. Patient is in no acute distress.  Skin: Skin is warm and dry. No rash noted.   Cardiovascular: Normal heart rate noted  Respiratory: Normal respiratory effort, no problems with respiration noted  Abdomen: Soft, gravid, appropriate for gestational age.  Pain/Pressure: Present     Pelvic: Cervical exam deferred Dilation: Closed Effacement (%): Thick Station: Ballotable  Extremities: Normal range of motion.  Edema: None  Mental Status: Normal mood and affect. Normal behavior. Normal judgment and thought content.   Labs and Imaging: Results for orders placed or performed in visit on 01/24/19 (from the past 24 hour(s))  POCT Urinalysis Dipstick     Status: Normal   Collection Time: 01/24/19 10:34 AM  Result Value Ref Range   Color, UA     Clarity, UA     Glucose, UA     Bilirubin, UA     Ketones, UA     Spec Grav,  UA     Blood, UA negative    pH, UA     Protein, UA     Urobilinogen, UA     Nitrite, UA     Leukocytes, UA Negative Negative   Appearance     Odor      [place] Mfm Fetal Bpp Wo Non Stress  Result Date: 01/22/2019 ----------------------------------------------------------------------  OBSTETRICS REPORT                       (Signed Final 01/22/2019 02:31 pm) ---------------------------------------------------------------------- Patient Info  ID #:       [id number]                          D.O.B.:  Aug 12, 1996 (22 yrs)  Name:       Madison Cook                Visit Date: 01/22/2019 01:22 pm ---------------------------------------------------------------------- Performed By  Performed By:     Lenise Arena        Secondary Phy.:   [facility]                    RDMS  Attending:        Lin Landsman      Address:          [address]                    [address]  Referred By:      Billey Gosling                Location:         [facility]                               [facility]  Ref. Address:     [address]                    [place], [place]                    [address] ---------------------------------------------------------------------- Orders   #  Description                          Code         Ordered By   1  [place] MFM OB FOLLOW UP                  [phone]     RAVI SHANKAR   2  [place] MFM FETAL BPP WO NON              76819.01     RAVI [facility]      STRESS  ----------------------------------------------------------------------   #  Order #                    Accession #                 Episode #   1  [id number]                  [id number]                  [id number]   2  [id number]                  [id number]                  [id number]  ---------------------------------------------------------------------- Indications   Hypertension - Gestational (ASA)               O13.9   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,      O36.0130   third trimester, unspecified (too weak to titer)   Obesity complicating pregnancy, third          O99.213   trimester 242lbs   [redacted] weeks gestation of pregnancy                Z3A.33  ---------------------------------------------------------------------- Vital Signs  Weight (lb): 242                               Height:        5'2"  BMI:         44.26 ---------------------------------------------------------------------- Fetal Evaluation  Num Of Fetuses:         1  Fetal Heart Rate(bpm):  133  Cardiac Activity:       Observed  Presentation:           Cephalic  Placenta:  Anterior  P. Cord Insertion:      Previously Visualized  Amniotic Fluid  AFI FV:      Within normal limits  AFI Sum(cm)     %Tile  17.83           65 ---------------------------------------------------------------------- Biophysical Evaluation  Amniotic F.V:   Within normal limits       F. Tone:        Observed  F. Movement:    Observed                   Score:          8/8  F. Breathing:   Observed ---------------------------------------------------------------------- Biometry  BPD:      87.2  mm     G. Age:  35w 1d         83  %    CI:         81.2   %    70 - 86                                                          FL/HC:      21.1   %    19.4 - 21.8  HC:      305.5  mm     G. Age:  34w 0d         20  %    HC/AC:      1.01        0.96 - 1.11  AC:      302.6  mm     G. Age:  34w 2d         64  %    FL/BPD:     74.0   %    71 - 87  FL:       64.5  mm     G. Age:  33w 2d         25  %    FL/AC:      21.3   %    20 - 24  Est. FW:    2334  gm      5 lb 2 oz     63  % ---------------------------------------------------------------------- OB History  Gravidity:    2         Term:   0        Prem:   0        SAB:   1  TOP:          0       Ectopic:  0        Living: 0 ---------------------------------------------------------------------- Gestational Age  LMP:           33w 6d        Date:   05/30/18                 EDD:   03/06/19  U/S Today:     34w 1d                                        EDD:   03/04/19  Best:  33w 6d     Det. By:  LMP  (05/30/18)          EDD:   03/06/19 ---------------------------------------------------------------------- Anatomy  Cranium:               Appears normal         Aortic Arch:            Appears normal  Cavum:                 Appears normal         Ductal Arch:            Not well visualized  Ventricles:            Appears normal         Diaphragm:              Appears normal  Choroid Plexus:        Previously seen        Stomach:                Appears normal, left                                                                        sided  Cerebellum:            Previously seen        Abdomen:                Appears normal  Posterior Fossa:       Previously seen        Abdominal Wall:         Not well visualized  Nuchal Fold:           Not applicable (>20    Cord Vessels:           Previously seen                         wks GA)  Face:                  Orbits previously      Kidneys:                Appear normal                         seen  Lips:                  Previously seen        Bladder:                Appears normal  Thoracic:              Appears normal         Spine:                  Previously seen  Heart:                 Not well visualized    Upper Extremities:      Visualized  RVOT:  Previously seen        Lower Extremities:      Previously seen  LVOT:                  Appears normal  Other:  Technically difficult due to advanced gestational age. ---------------------------------------------------------------------- Cervix Uterus Adnexa  Cervix  Not visualized (advanced GA >24wks) ---------------------------------------------------------------------- Impression  Normal interval growth.  Biophysical profile 8/8 ---------------------------------------------------------------------- Recommendations  Continue weekly testing   Repeat growth in 4 weeks. ----------------------------------------------------------------------               Lin Landsman, MD Electronically Signed Final Report   01/22/2019 02:31 pm ----------------------------------------------------------------------  [place] Mfm Fetal Bpp Wo Non Stress  Result Date: 01/16/2019 ----------------------------------------------------------------------  OBSTETRICS REPORT                       (Signed Final 01/16/2019 05:14 pm) ---------------------------------------------------------------------- Patient Info  ID #:       [id number]                          D.O.B.:  May 21, 1996 (22 yrs)  Name:       Madison Cook                Visit Date: 01/16/2019 04:31 pm ---------------------------------------------------------------------- Performed By  Performed By:     Ellin Saba        Secondary Phy.:   [facility]                    RDMS  Attending:        Noralee Space MD        Address:          [address]                                                             [address]  Referred By:      Billey Gosling                Location:         [facility]                               [facility]  Ref. Address:     [address]                    [place], [place]                    [address] ---------------------------------------------------------------------- Orders   #  Description                          Code         Ordered By   1  [place] MFM FETAL BPP WO NON              [phone]     RAVI  [facility]      STRESS  ----------------------------------------------------------------------   #  Order #                    Accession #                 Episode #   1  [id number]                  [id number]                  [id number]  ---------------------------------------------------------------------- Indications   Hypertension - Gestational (ASA)               O13.9   [redacted] weeks gestation of pregnancy                Z3A.33   Maternal care  for anti-D [Rh} antibodies,      O36.0130   third trimester, unspecified (too weak to titer)   Obesity complicating pregnancy, third          O99.213   trimester 242lbs  ---------------------------------------------------------------------- Fetal Evaluation  Num Of Fetuses:         1  Fetal Heart Rate(bpm):  144  Cardiac Activity:       Observed  Presentation:           Cephalic  Amniotic Fluid  AFI FV:      Within normal limits  AFI Sum(cm)     %Tile       Largest Pocket(cm)  16.66           60          7.98  RUQ(cm)                     LUQ(cm)        LLQ(cm)  4.14                        7.98           4.54 ---------------------------------------------------------------------- Biophysical Evaluation  Amniotic F.V:   Within normal limits       F. Tone:        Observed  F. Movement:    Observed                   Score:          8/8  F. Breathing:   Observed ---------------------------------------------------------------------- OB History  Gravidity:    2         Term:   0        Prem:   0        SAB:   1  TOP:          0       Ectopic:  0        Living: 0 ---------------------------------------------------------------------- Gestational Age  LMP:           33w 0d        Date:  05/30/18                 EDD:   03/06/19  Best:          33w 0d     Det. By:  LMP  (05/30/18)          EDD:   03/06/19 ---------------------------------------------------------------------- Anatomy  Face:                  Appears  normal         Stomach:                Appears normal, left                         (orbits and profile)                           sided  Heart:                 Not well visualized    Kidneys:                Appear normal  LVOT:                  Not well visualized    Bladder:                Appears normal ---------------------------------------------------------------------- Impression  Amniotic fluid is normal and good fetal activity is seen.  Antenatal testing is reassuring. BPP 8/8.  NST performed at your office  today was reactive. ---------------------------------------------------------------------- Recommendations  -Continue weekly BPP. ----------------------------------------------------------------------                  Noralee Space, MD Electronically Signed Final Report   01/16/2019 05:14 pm ----------------------------------------------------------------------  [place] Mfm Ob Detail +14 Wk  Result Date: 12/25/2018 ----------------------------------------------------------------------  OBSTETRICS REPORT                       (Signed Final 12/25/2018 01:36 pm) ---------------------------------------------------------------------- Patient Info  ID #:       [id number]                          D.O.B.:  1995/10/29 (22 yrs)  Name:       Madison Cook                Visit Date: 12/25/2018 12:27 pm ---------------------------------------------------------------------- Performed By  Performed By:     Marcellina Millin          [facility].:   [facility]  Attending:        Noralee Space MD  Address:          [address]                                                             [address]  Referred By:      Billey Gosling                Location:         [facility]                               [facility]  Ref. Address:     [address]                    [place], [place]                    [address] ---------------------------------------------------------------------- Orders   #  Description                          Code         Ordered By   1  [place] MFM OB DETAIL +14 WK              [id number]     [facility]  ----------------------------------------------------------------------   #  Order #                    Accession #                  Episode #   1  [id number]                  [id number]                  [id number]  ---------------------------------------------------------------------- Indications   Encounter for antenatal screening for          Z36.3   malformations   [redacted] weeks gestation of pregnancy                Z3A.29   Hypertension - Gestational (ASA)               O13.9   Maternal 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 Cord Insertion:      Visualized  Amniotic Fluid  AFI FV:      Within normal limits  AFI Sum(cm)     %Tile       Largest Pocket(cm)  15.16  53          5.13  RUQ(cm)       RLQ(cm)       LUQ(cm)        LLQ(cm)  4.74          2.93          5.13           2.36 ---------------------------------------------------------------------- Biometry  BPD:      78.1  mm     G. Age:  31w 2d         82  %    CI:        76.74   %    70 - 86                                                          FL/HC:      19.5   %    19.2 - 21.4  HC:      282.4  mm     G. Age:  31w 0d         47  %    HC/AC:      1.12        0.99 - 1.21  AC:      252.2  mm     G. Age:  29w 3d         32  %    FL/BPD:     70.7   %    71 - 87  FL:       55.2  mm     G. Age:  29w 1d         18  %    FL/AC:      21.9   %    20 - 24  HUM:      49.5  mm     G. Age:  29w 1d         35  %  Est. FW:    1422  gm      3 lb 2 oz     48  % ---------------------------------------------------------------------- OB History  Gravidity:    2         Term:   0        Prem:   0        SAB:   1  TOP:          0       Ectopic:  0        Living: 0 ---------------------------------------------------------------------- Gestational Age  LMP:           29w 6d        Date:  05/30/18                  EDD:   03/06/19  U/S Today:     30w 2d                                        EDD:   03/03/19  Best:          29w 6d     Det. By:  LMP  (05/30/18)  EDD:   03/06/19 ---------------------------------------------------------------------- Anatomy  Cranium:               Appears normal         Aortic Arch:            Appears normal  Cavum:                 Appears normal         Ductal Arch:            Not well visualized  Ventricles:            Appears normal         Diaphragm:              Appears normal  Choroid Plexus:        Appears normal         Stomach:                Appears normal, left                                                                        sided  Cerebellum:            Appears normal         Abdomen:                Appears normal  Posterior Fossa:       Appears normal         Abdominal Wall:         Not well visualized  Nuchal Fold:           Not applicable (>20    Cord Vessels:           Appears normal ([redacted]                         wks GA)                                        vessel cord)  Face:                  Orbits nl; profile not Kidneys:                Appear normal                         well visualized  Lips:                  Appears normal         Bladder:                Appears normal  Thoracic:              Appears normal         Spine:                  Appears normal  Heart:                 Not well visualized    Upper Extremities:  Visualized  RVOT:                  Appears normal         Lower Extremities:      Appears normal  LVOT:                  Not well visualized  Other:  Technically difficult due to advanced gestational age. ---------------------------------------------------------------------- Cervix Uterus Adnexa  Cervix  Not visualized (advanced GA >24wks) ---------------------------------------------------------------------- Impression  Patient has a diagnosis of gestational hypertension.  Protein/creatinine ratio and labs including liver enzymes and   platelets are within normal limits. She does not have  gestational diabetes.  She does not have symptoms of severe preeclampsia.  Fetal growth is appropriate for gestational age. Amniotic fluid  is normal and good fetal activity is seen. Fetal anatomy  appears normal, but limited by advanced gestational age.  We reassured the patient of the findings. ---------------------------------------------------------------------- Recommendations  -Weekly BPP from next visit till delivery. ----------------------------------------------------------------------                  Noralee Space, MD Electronically Signed Final Report   12/25/2018 01:36 pm ----------------------------------------------------------------------  [place] Mfm Ob Follow Up  Result Date: 01/22/2019 ----------------------------------------------------------------------  OBSTETRICS REPORT                       (Signed Final 01/22/2019 02:31 pm) ---------------------------------------------------------------------- Patient Info  ID #:       [id number]                          D.O.B.:  [birth year] (22 yrs)  Name:       Madison Cook                Visit Date: 01/22/2019 01:22 pm ---------------------------------------------------------------------- Performed By  Performed By:     Lenise Arena        Secondary Phy.:   [facility]                    RDMS  Attending:        Lin Landsman      Address:          [address]                    [address]                                                             [address]  Referred By:      Billey Gosling                Location:         [facility]                               [facility]  Ref. Address:     [address]  [place], [place]                    [address] ---------------------------------------------------------------------- Orders   #  Description                          Code         Ordered By   1  [place] MFM OB FOLLOW UP                   76816.01     RAVI SHANKAR   2  [place] MFM FETAL BPP WO NON              76819.01     RAVI [facility]      STRESS  ----------------------------------------------------------------------   #  Order #                    Accession #                 Episode #   1  [id number]                  [id number]                  [id number]   2  [id number]                  [id number]                  [id number]  ---------------------------------------------------------------------- Indications   Hypertension - Gestational (ASA)               O13.9   Maternal 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,      O36.0130   third trimester, unspecified (too weak to titer)   Obesity complicating pregnancy, third          O99.213   trimester 242lbs   [redacted] weeks gestation of pregnancy                Z3A.33  ---------------------------------------------------------------------- Vital Signs  Weight (lb): 242                               Height:        5'2"  BMI:         44.26 ---------------------------------------------------------------------- Fetal Evaluation  Num Of Fetuses:         1  Fetal Heart Rate(bpm):  133  Cardiac Activity:       Observed  Presentation:           Cephalic  Placenta:               Anterior  P. Cord Insertion:      Previously Visualized  Amniotic Fluid  AFI FV:      Within normal limits  AFI Sum(cm)     %Tile  17.83           65 ---------------------------------------------------------------------- Biophysical Evaluation  Amniotic F.V:   Within normal limits       F. Tone:        Observed  F. Movement:    Observed                   Score:          8/8  F. Breathing:   Observed ---------------------------------------------------------------------- Biometry  BPD:      87.2  mm     G. Age:  35w 1d         83  %    CI:         81.2   %    70 - 86                                                          FL/HC:      21.1   %    19.4 - 21.8  HC:      305.5  mm     G. Age:  34w 0d         20  %    HC/AC:      1.01        0.96 - 1.11  AC:       302.6  mm     G. Age:  34w 2d         64  %    FL/BPD:     74.0   %    71 - 87  FL:       64.5  mm     G. Age:  33w 2d         25  %    FL/AC:      21.3   %    20 - 24  Est. FW:    2334  gm      5 lb 2 oz     63  % ---------------------------------------------------------------------- OB History  Gravidity:    2         Term:   0        Prem:   0        SAB:   1  TOP:          0       Ectopic:  0        Living: 0 ---------------------------------------------------------------------- Gestational Age  LMP:           33w 6d        Date:  05/30/18                 EDD:   03/06/19  U/S Today:     34w 1d                                        EDD:   03/04/19  Best:          33w 6d     Det. By:  LMP  (05/30/18)          EDD:   03/06/19 ---------------------------------------------------------------------- Anatomy  Cranium:               Appears normal         Aortic Arch:            Appears normal  Cavum:                 Appears normal         Ductal Arch:            Not well visualized  Ventricles:            Appears normal  Diaphragm:              Appears normal  Choroid Plexus:        Previously seen        Stomach:                Appears normal, left                                                                        sided  Cerebellum:            Previously seen        Abdomen:                Appears normal  Posterior Fossa:       Previously seen        Abdominal Wall:         Not well visualized  Nuchal Fold:           Not applicable (>20    Cord Vessels:           Previously seen                         wks GA)  Face:                  Orbits previously      Kidneys:                Appear normal                         seen  Lips:                  Previously seen        Bladder:                Appears normal  Thoracic:              Appears normal         Spine:                  Previously seen  Heart:                 Not well visualized    Upper Extremities:      Visualized  RVOT:                  Previously  seen        Lower Extremities:      Previously seen  LVOT:                  Appears normal  Other:  Technically difficult due to advanced gestational age. ---------------------------------------------------------------------- Cervix Uterus Adnexa  Cervix  Not visualized (advanced GA >24wks) ---------------------------------------------------------------------- Impression  Normal interval growth.  Biophysical profile 8/8 ---------------------------------------------------------------------- Recommendations  Continue weekly testing  Repeat growth in 4 weeks. ----------------------------------------------------------------------               Lin Landsman, MD Electronically Signed Final Report   01/22/2019 02:31 pm ----------------------------------------------------------------------   Assessment and Plan:  Pregnancy: G2P0010 at [redacted]w[redacted]d 1. Gestational hypertension, third trimester PEC labs checked today. NST performed  today was reviewed and was found to be reactive.  Continue recommended antenatal testing and prenatal care, will get BPPs weekly at [facility]. PEC precautions given. - Comprehensive metabolic panel - CBC - Protein / creatinine ratio, urine  2. Supervision of high risk pregnancy, antepartum - POCT Urinalysis Dipstick negative for protein Preterm labor symptoms and general obstetric precautions including but not limited to vaginal bleeding, contractions, leaking of fluid and fetal movement were reviewed in detail with the patient. Please refer to After Visit Summary for other counseling recommendations.   Return in about 2 weeks (around 02/07/2019) for Pelvic cultures, NST, OFFICE OB Visit.  Future Appointments  Date Time Provider Department Center  01/29/2019 12:30 PM [facility] NURSE [facility]  01/29/2019 12:30 PM [facility] [place] 1 [facility]-MFCUS M[facility]-US  02/05/2019  1:00 PM [facility] NURSE [facility]  02/05/2019  1:00 PM [facility]-MFC [place] 3 WH-MFCUS MFC-US  02/12/2019  1:00 PM WH-MFC NURSE [facility] M[facility]-US   02/12/2019  1:00 PM WH-MFC [place] 3 WH-MFCUS M[facility]-US  02/19/2019  1:00 PM [facility]-MFC NURSE [facility]-M[facility] M[facility]-US  02/19/2019  1:00 PM [facility]-M[facility] [place] 3 WH-MFCUS MFC-US    Jaynie Collins, MD

## 2019-01-25 LAB — CBC
Hematocrit: 34.7 % (ref 34.0–46.6)
Hemoglobin: 12.2 g/dL (ref 11.1–15.9)
MCH: 31.4 pg (ref 26.6–33.0)
MCHC: 35.2 g/dL (ref 31.5–35.7)
MCV: 89 fL (ref 79–97)
Platelets: 205 10*3/uL (ref 150–450)
RBC: 3.89 x10E6/uL (ref 3.77–5.28)
RDW: 12.8 % (ref 11.7–15.4)
WBC: 18 10*3/uL — ABNORMAL HIGH (ref 3.4–10.8)

## 2019-01-25 LAB — COMPREHENSIVE METABOLIC PANEL
ALT: 9 IU/L (ref 0–32)
AST: 15 IU/L (ref 0–40)
Albumin/Globulin Ratio: 1.3 (ref 1.2–2.2)
Albumin: 3.5 g/dL — ABNORMAL LOW (ref 3.9–5.0)
Alkaline Phosphatase: 80 IU/L (ref 39–117)
BUN/Creatinine Ratio: 10 (ref 9–23)
BUN: 6 mg/dL (ref 6–20)
Bilirubin Total: <0.2 mg/dL (ref 0.0–1.2)
CO2: 17 mmol/L — ABNORMAL LOW (ref 20–29)
Calcium: 9.4 mg/dL (ref 8.7–10.2)
Chloride: 102 mmol/L (ref 96–106)
Creatinine, Ser: 0.61 mg/dL (ref 0.57–1.00)
GFR calc Af Amer: 150 mL/min/{1.73_m2} (ref >59)
GFR calc non Af Amer: 130 mL/min/{1.73_m2} (ref >59)
Globulin, Total: 2.7 g/dL (ref 1.5–4.5)
Glucose: 82 mg/dL (ref 65–99)
Potassium: 4.5 mmol/L (ref 3.5–5.2)
Sodium: 138 mmol/L (ref 134–144)
Total Protein: 6.2 g/dL (ref 6.0–8.5)

## 2019-01-25 LAB — PROTEIN / CREATININE RATIO, URINE
Creatinine, Urine: 44.8 mg/dL
Protein, Ur: 8.8 mg/dL
Protein/Creat Ratio: 196 mg/g creat (ref 0–200)

## 2019-01-29 ENCOUNTER — Ambulatory Visit (HOSPITAL_COMMUNITY): Payer: Medicaid Other | Admitting: *Deleted

## 2019-01-29 ENCOUNTER — Other Ambulatory Visit: Payer: Self-pay

## 2019-01-29 ENCOUNTER — Encounter (HOSPITAL_COMMUNITY): Payer: Self-pay

## 2019-01-29 ENCOUNTER — Ambulatory Visit (HOSPITAL_COMMUNITY)
Admission: RE | Admit: 2019-01-29 | Discharge: 2019-01-29 | Disposition: A | Discharge status: Home / Self Care | Payer: Medicaid Other | Source: Ambulatory Visit | Attending: Obstetrics and Gynecology | Admitting: Obstetrics and Gynecology

## 2019-01-29 ENCOUNTER — Other Ambulatory Visit: Payer: Self-pay | Admitting: Family Medicine

## 2019-01-29 VITALS — BP 146/104 | HR 101 | Temp 98.2°F

## 2019-01-29 DIAGNOSIS — O99213 Obesity complicating pregnancy, third trimester: Secondary | ICD-10-CM

## 2019-01-29 DIAGNOSIS — Z3A34 34 weeks gestation of pregnancy: Secondary | ICD-10-CM

## 2019-01-29 DIAGNOSIS — O360130 Maternal care for anti-D [Rh] antibodies, third trimester, not applicable or unspecified: Secondary | ICD-10-CM | POA: Diagnosis not present

## 2019-01-29 DIAGNOSIS — O139 Gestational [pregnancy-induced] hypertension without significant proteinuria, unspecified trimester: Secondary | ICD-10-CM

## 2019-01-29 DIAGNOSIS — O133 Gestational [pregnancy-induced] hypertension without significant proteinuria, third trimester: Secondary | ICD-10-CM

## 2019-01-29 IMAGING — US US MFM FETAL BPP WO NON STRESS
1 series · 15 of 22 positions shown · non-contrast
Comparison: none

[Series 1: us mfm fetal bpp wo non stress · 22 acquisitions, 15 frames shown]
[im 1/22]
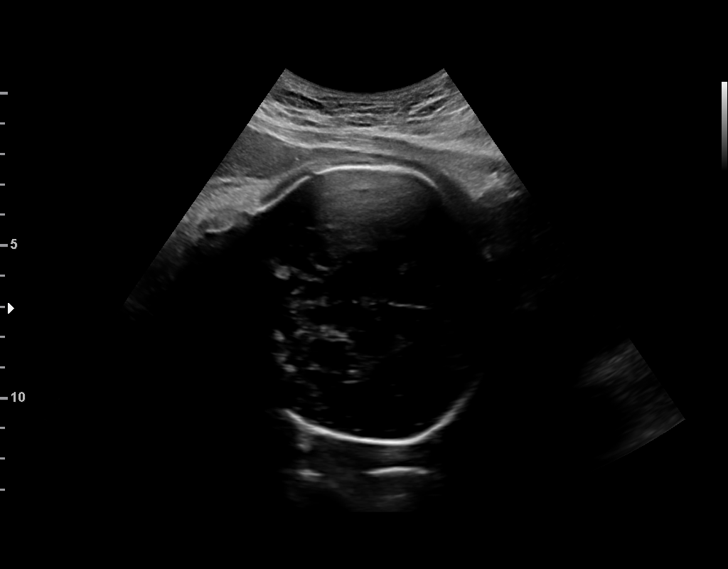
[im 3/22]
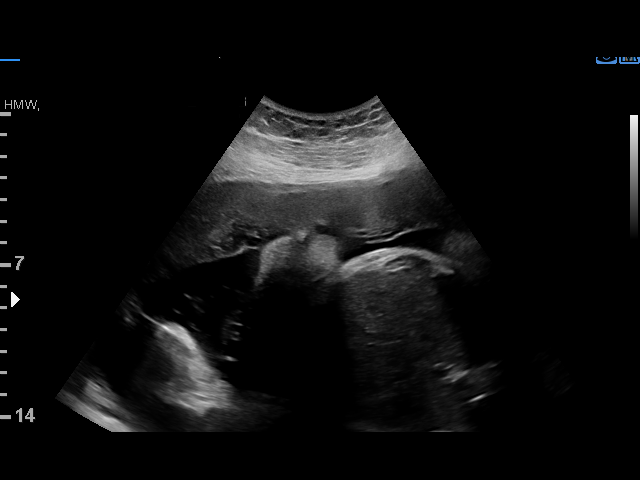
[im 4/22]
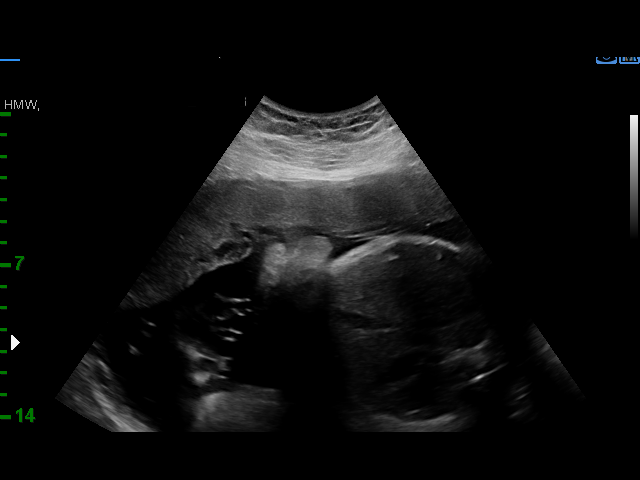
[im 6/22]
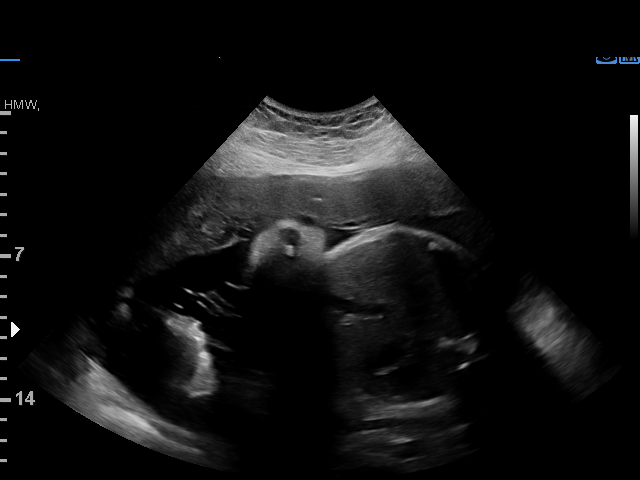
[im 7/22]
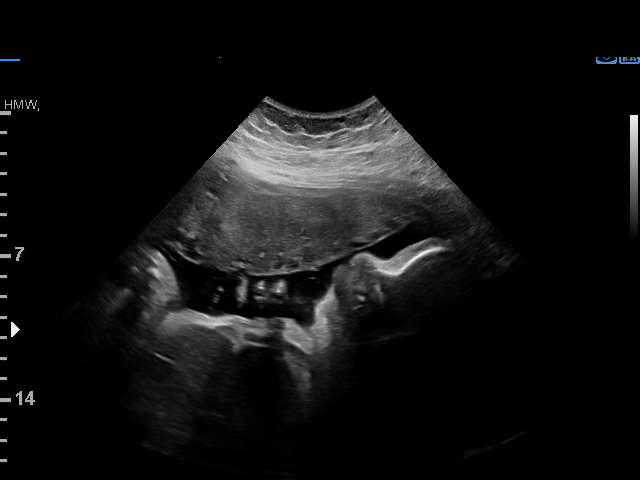
[im 9/22]
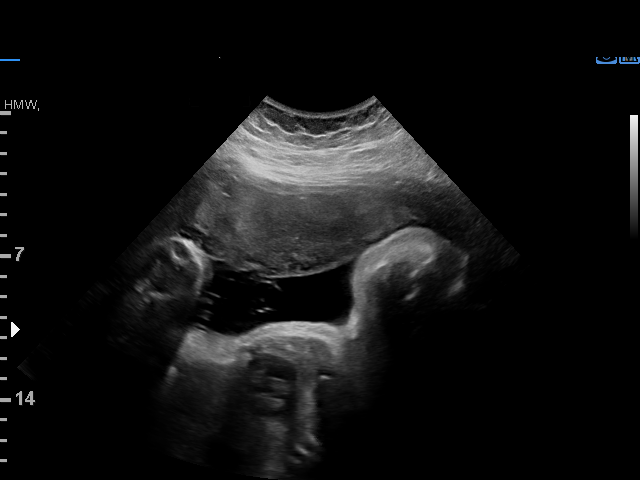
[im 10/22]
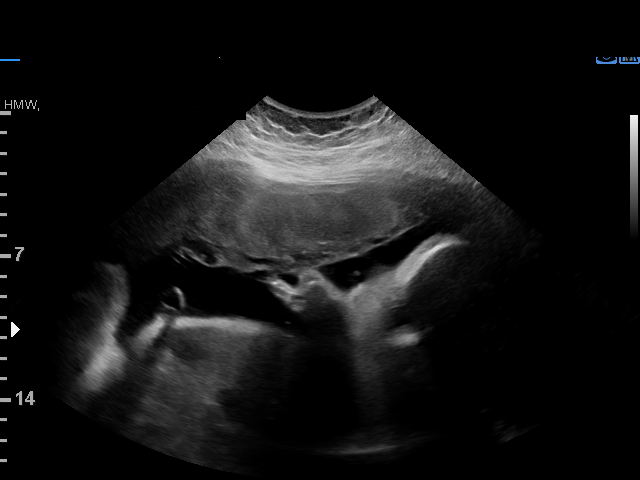
[im 12/22]
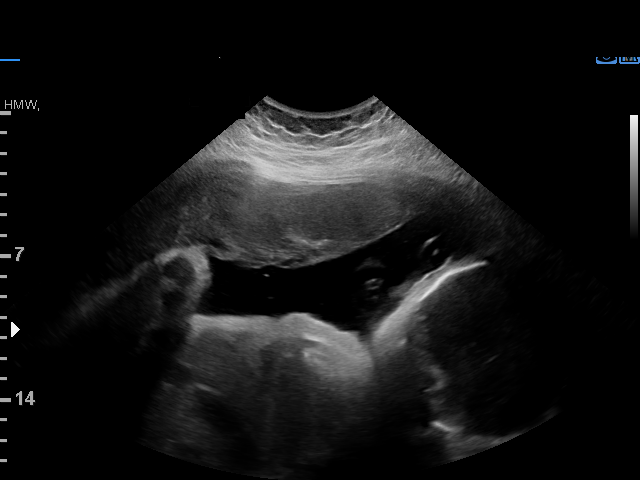
[im 13/22]
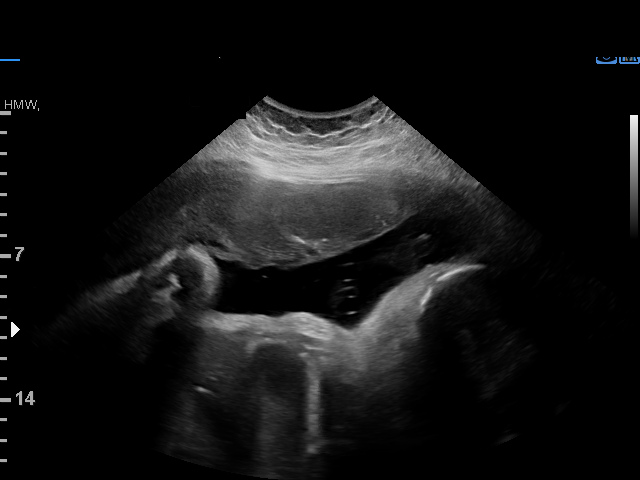
[im 14/22]
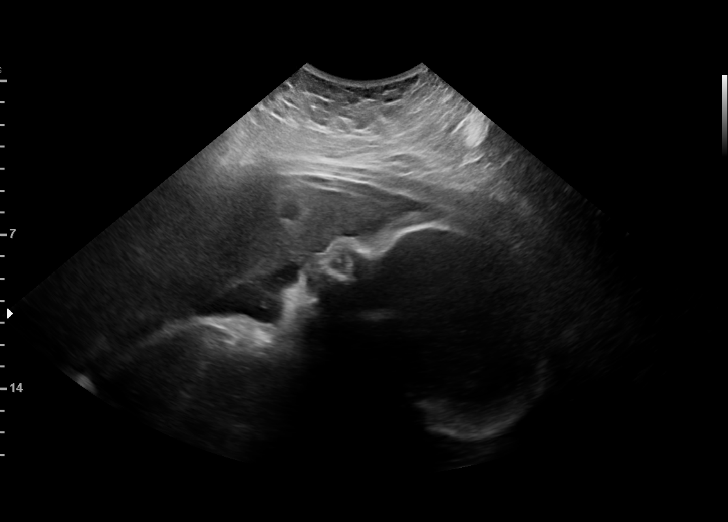
[im 16/22]
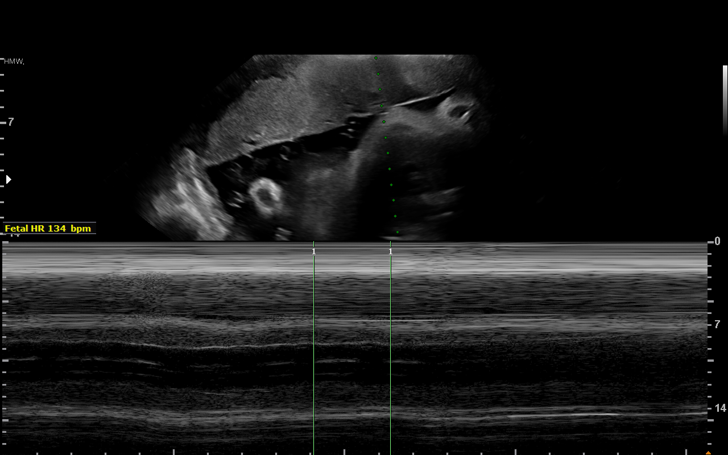
[im 17/22]
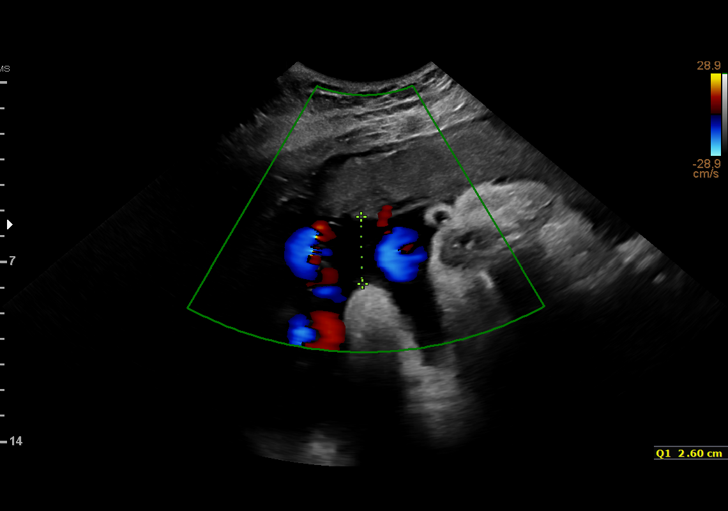
[im 19/22]
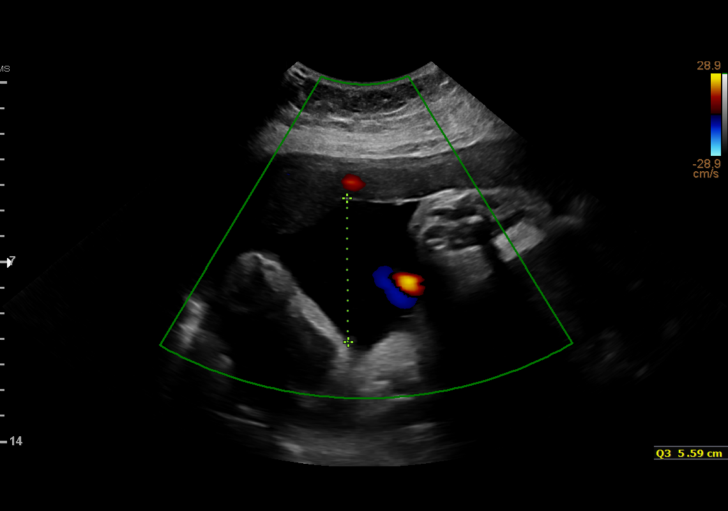
[im 20/22]
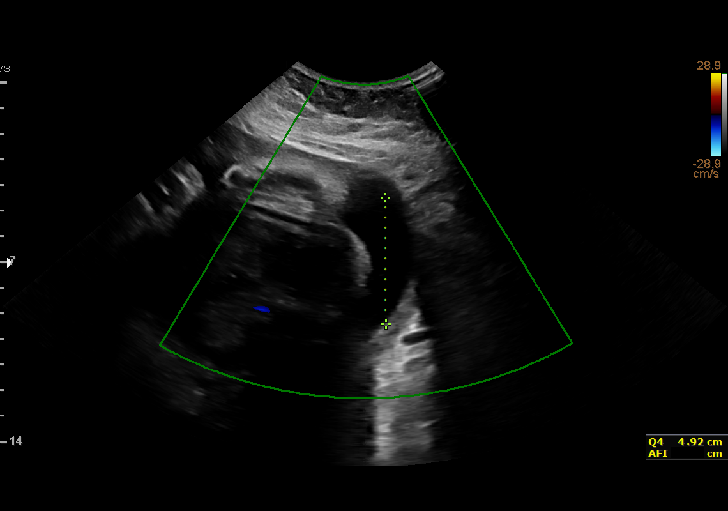
[im 22/22]
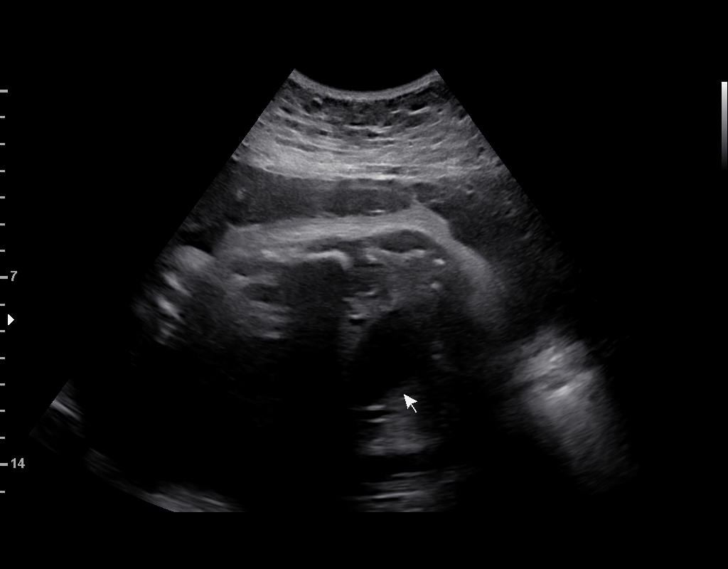

[15 of 22 positions shown; findings below may reference images not displayed]

[9R]

 ----------------------------------------------------------------------

 ----------------------------------------------------------------------
Indications

  Hypertension - Gestational (ASA)               [9R]
  Maternal care for anti-D Rh antibodies,      [9R]
  third trimester, unspecified (too weak to titer)
  Obesity complicating pregnancy, third          [9R]
  trimester [9R]
  34 weeks gestation of pregnancy
 ----------------------------------------------------------------------
Vital Signs

 BMI:
Fetal Evaluation

 Num Of Fetuses:         1
 Fetal Heart Rate(bpm):  135
 Cardiac Activity:       Observed
 Presentation:           Cephalic

 Amniotic Fluid
 AFI FV:      Within normal limits

 AFI Sum(cm)     %Tile       Largest Pocket(cm)
 17.26           63

 RUQ(cm)       RLQ(cm)       LUQ(cm)        LLQ(cm)

Biophysical Evaluation

 Amniotic F.V:   Within normal limits       F. Tone:        Observed
 F. Movement:    Observed                   Score:          [DATE]
 F. Breathing:   Observed
OB History

 Gravidity:    2         Term:   0        Prem:   0        SAB:   1
 TOP:          0       Ectopic:  0        Living: 0
Gestational Age

 LMP:           34w 6d        Date:  [DATE]                 EDD:   [DATE]
 Best:          34w 6d     Det. By:  LMP  ([DATE])          EDD:   [DATE]
Anatomy

 Stomach:               Appears normal, left   Bladder:                Appears normal
                        sided
Impression

 Biophysical profile [DATE]
 Elevated blood pressure today GHTN not on meds 150/103,
 150/106 and 146/104.
Recommendations

 Obtain labs today in [facility] clinic repeat BP in 24-48 hours.
 Repeat testing in 1 week

 preeclampsia precautions reviewed-

 Discussed plan with Dr. ASUCENA who was in agreement.

## 2019-01-30 ENCOUNTER — Other Ambulatory Visit: Payer: Commercial Managed Care - PPO

## 2019-01-30 DIAGNOSIS — O133 Gestational [pregnancy-induced] hypertension without significant proteinuria, third trimester: Secondary | ICD-10-CM

## 2019-01-31 LAB — COMPREHENSIVE METABOLIC PANEL
ALT: 11 IU/L (ref 0–32)
AST: 11 IU/L (ref 0–40)
Albumin/Globulin Ratio: 1.4 (ref 1.2–2.2)
Albumin: 3.4 g/dL — ABNORMAL LOW (ref 3.9–5.0)
Alkaline Phosphatase: 78 IU/L (ref 39–117)
BUN/Creatinine Ratio: 11 (ref 9–23)
BUN: 7 mg/dL (ref 6–20)
Bilirubin Total: 0.2 mg/dL (ref 0.0–1.2)
CO2: 18 mmol/L — ABNORMAL LOW (ref 20–29)
Calcium: 9 mg/dL (ref 8.7–10.2)
Chloride: 104 mmol/L (ref 96–106)
Creatinine, Ser: 0.61 mg/dL (ref 0.57–1.00)
GFR calc Af Amer: 150 mL/min/{1.73_m2} (ref >59)
GFR calc non Af Amer: 130 mL/min/{1.73_m2} (ref >59)
Globulin, Total: 2.4 g/dL (ref 1.5–4.5)
Glucose: 81 mg/dL (ref 65–99)
Potassium: 3.9 mmol/L (ref 3.5–5.2)
Sodium: 139 mmol/L (ref 134–144)
Total Protein: 5.8 g/dL — ABNORMAL LOW (ref 6.0–8.5)

## 2019-01-31 LAB — CBC
Hematocrit: 35.6 % (ref 34.0–46.6)
Hemoglobin: 11.7 g/dL (ref 11.1–15.9)
MCH: 29.3 pg (ref 26.6–33.0)
MCHC: 32.9 g/dL (ref 31.5–35.7)
MCV: 89 fL (ref 79–97)
Platelets: 222 10*3/uL (ref 150–450)
RBC: 4 x10E6/uL (ref 3.77–5.28)
RDW: 12.7 % (ref 11.7–15.4)
WBC: 16.8 10*3/uL — ABNORMAL HIGH (ref 3.4–10.8)

## 2019-01-31 LAB — PROTEIN / CREATININE RATIO, URINE
Creatinine, Urine: 16.3 mg/dL
Protein, Ur: <4 mg/dL

## 2019-02-05 ENCOUNTER — Ambulatory Visit (HOSPITAL_COMMUNITY): Payer: Commercial Managed Care - PPO

## 2019-02-05 ENCOUNTER — Ambulatory Visit (HOSPITAL_COMMUNITY): Admission: RE | Admit: 2019-02-05 | Payer: Commercial Managed Care - PPO | Source: Ambulatory Visit

## 2019-02-06 ENCOUNTER — Ambulatory Visit (HOSPITAL_COMMUNITY)
Admission: RE | Admit: 2019-02-06 | Discharge: 2019-02-06 | Disposition: A | Discharge status: Home / Self Care | Payer: Medicaid Other | Source: Ambulatory Visit | Attending: Maternal & Fetal Medicine | Admitting: Maternal & Fetal Medicine

## 2019-02-06 ENCOUNTER — Other Ambulatory Visit: Payer: Self-pay

## 2019-02-06 ENCOUNTER — Encounter (HOSPITAL_COMMUNITY): Payer: Self-pay | Admitting: *Deleted

## 2019-02-06 ENCOUNTER — Ambulatory Visit (HOSPITAL_COMMUNITY): Payer: Medicaid Other | Admitting: *Deleted

## 2019-02-06 VITALS — BP 152/93 | HR 95 | Temp 98.0°F

## 2019-02-06 DIAGNOSIS — O139 Gestational [pregnancy-induced] hypertension without significant proteinuria, unspecified trimester: Secondary | ICD-10-CM | POA: Diagnosis not present

## 2019-02-06 DIAGNOSIS — I10 Essential (primary) hypertension: Secondary | ICD-10-CM

## 2019-02-06 DIAGNOSIS — O360130 Maternal care for anti-D [Rh] antibodies, third trimester, not applicable or unspecified: Secondary | ICD-10-CM | POA: Diagnosis not present

## 2019-02-06 DIAGNOSIS — Z3A36 36 weeks gestation of pregnancy: Secondary | ICD-10-CM

## 2019-02-06 DIAGNOSIS — O133 Gestational [pregnancy-induced] hypertension without significant proteinuria, third trimester: Secondary | ICD-10-CM | POA: Diagnosis not present

## 2019-02-06 DIAGNOSIS — O99213 Obesity complicating pregnancy, third trimester: Secondary | ICD-10-CM

## 2019-02-06 IMAGING — US US MFM FETAL BPP WO NON STRESS
1 series · 12 of 21 positions shown · non-contrast
Comparison: none

[Series 1: us mfm fetal bpp wo non stress · 21 acquisitions, 12 frames shown]
[im 1/21]
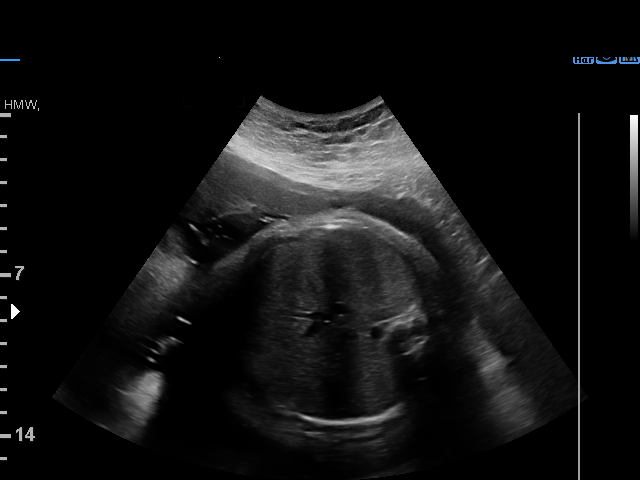
[im 3/21]
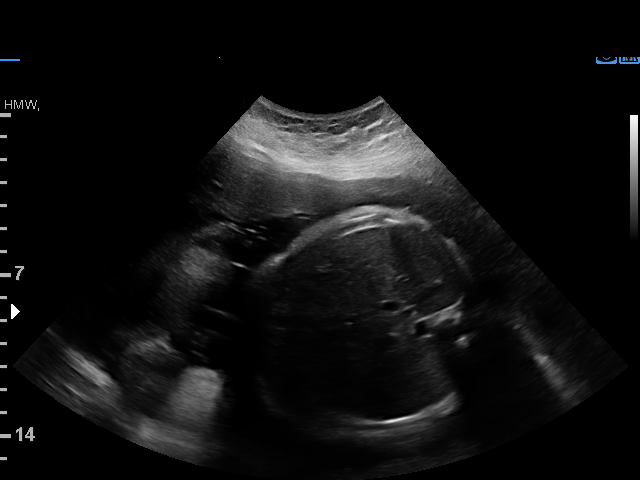
[im 5/21]
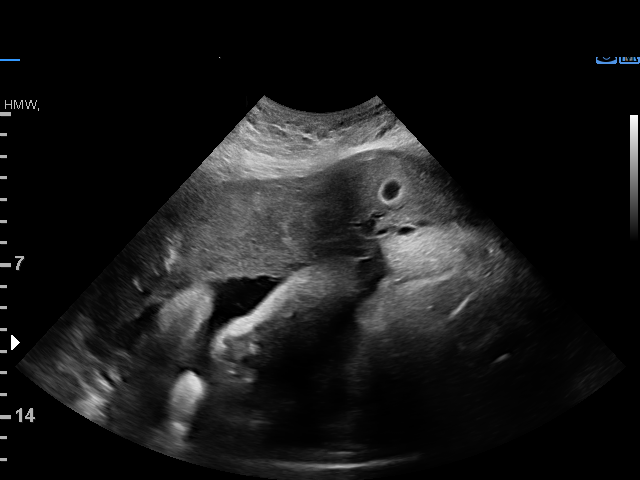
[im 7/21]
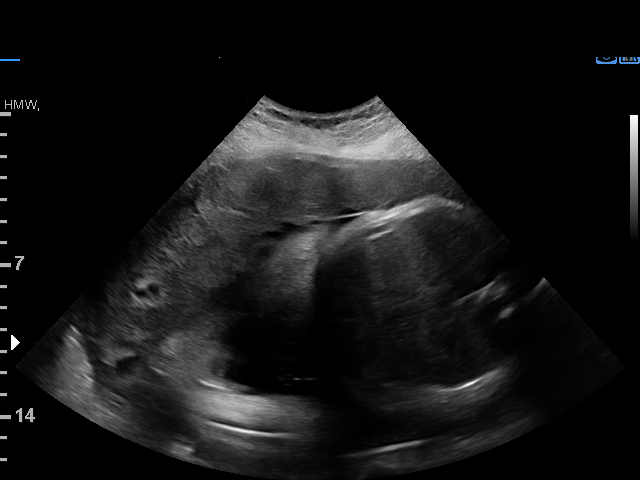
[im 8/21]
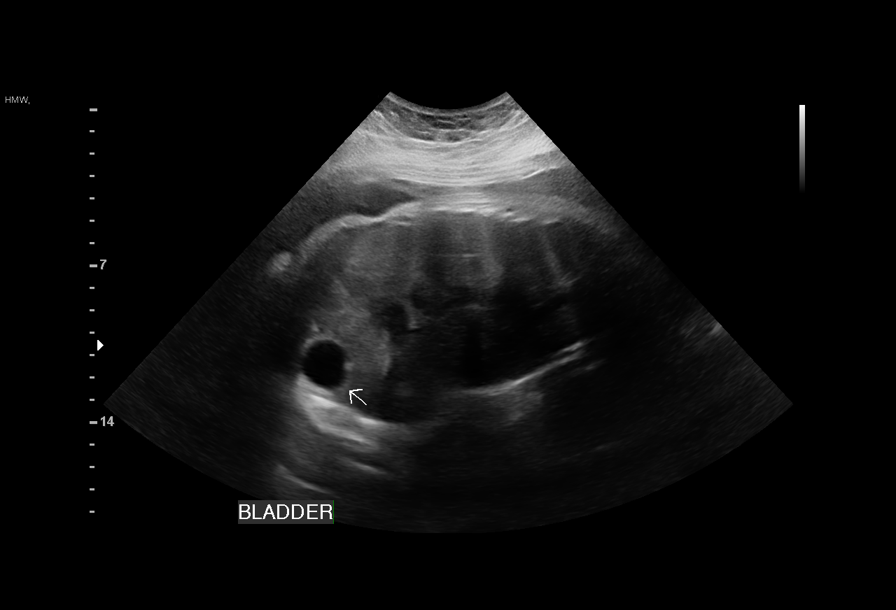
[im 10/21]
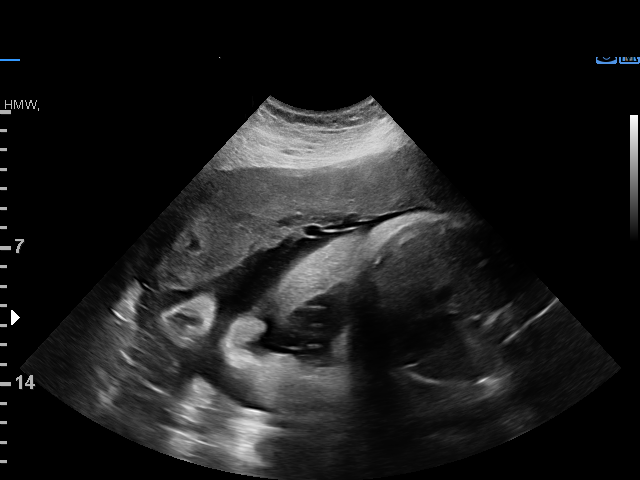
[im 12/21]
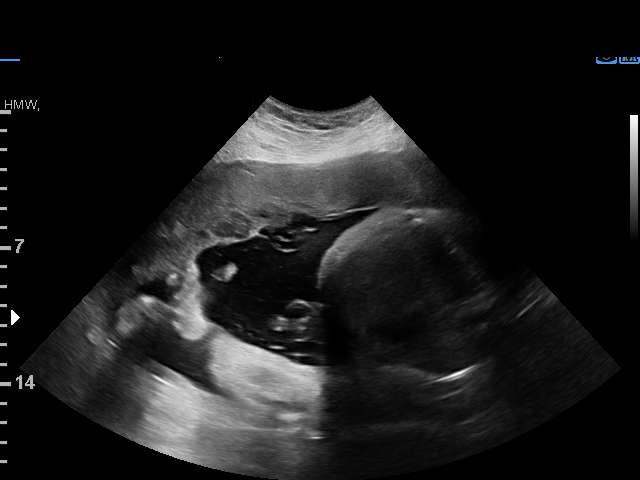
[im 14/21]
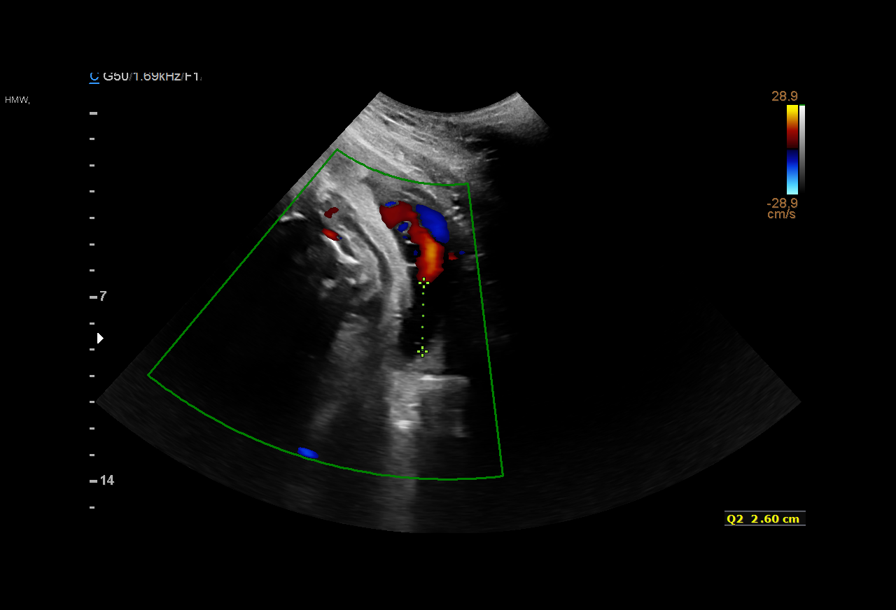
[im 15/21]
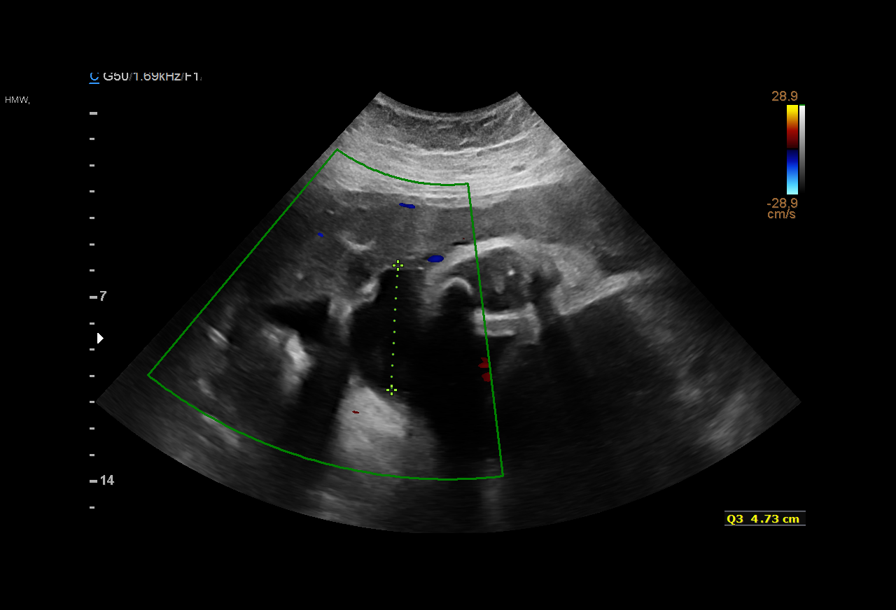
[im 17/21]
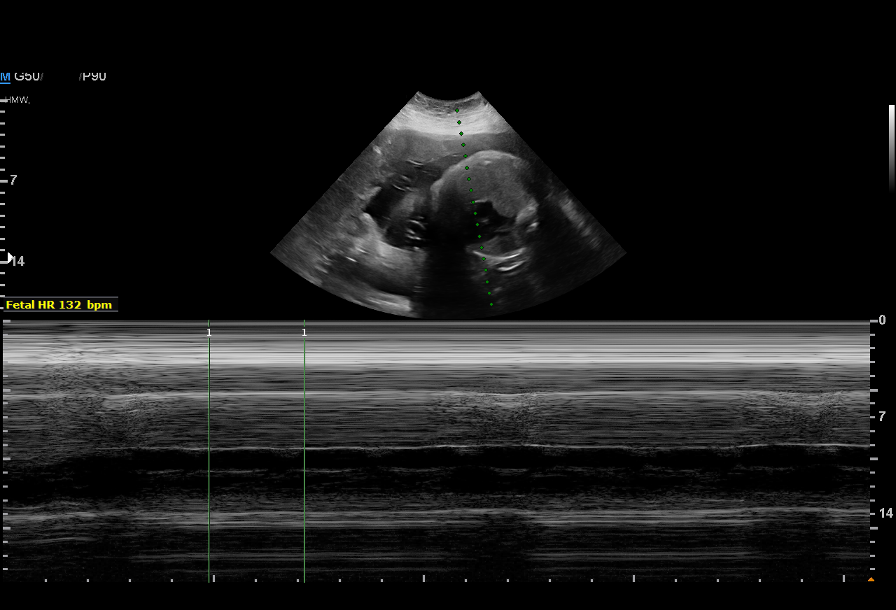
[im 19/21]
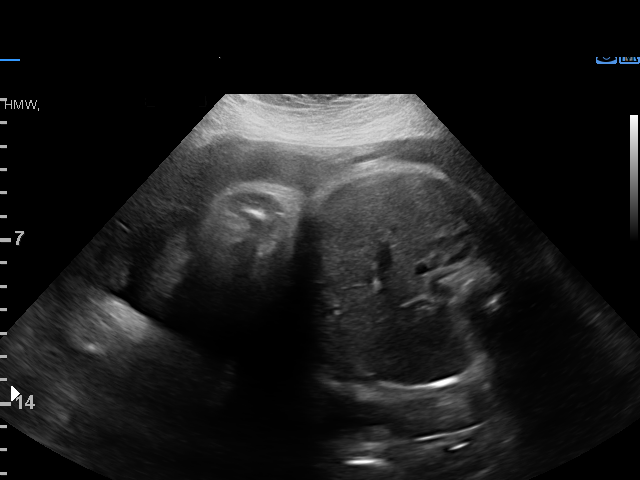
[im 21/21]
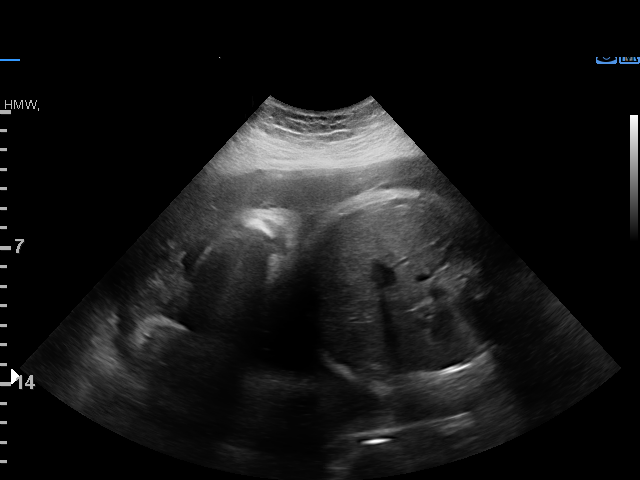

[12 of 21 positions shown; findings below may reference images not displayed]

[34]

     STRESS                                            GERONIMO
 ----------------------------------------------------------------------

 ----------------------------------------------------------------------
Indications

  36 weeks gestation of pregnancy
  Hypertension - Gestational (ASA)               [34]
  Maternal care for anti-D Rh antibodies,      [34]
  third trimester, unspecified (too weak to titer)
  Obesity complicating pregnancy, third          [34]
  trimester [34]
 ----------------------------------------------------------------------
Vital Signs

                                                Height:        5'2"
Fetal Evaluation

 Num Of Fetuses:          1
 Fetal Heart Rate(bpm):   132
 Cardiac Activity:        Observed
 Presentation:            Cephalic

 Amniotic Fluid
 AFI FV:      Within normal limits

 AFI Sum(cm)     %Tile       Largest Pocket(cm)
 15.29           56

 RUQ(cm)       RLQ(cm)       LUQ(cm)        LLQ(cm)

Biophysical Evaluation
 Amniotic F.V:   Within normal limits       F. Tone:         Observed
 F. Movement:    Observed                   Score:           [DATE]
 F. Breathing:   Observed
OB History

 Gravidity:    2         Term:   0        Prem:   0        SAB:   1
 TOP:          0       Ectopic:  0        Living: 0
Gestational Age

 LMP:           36w 0d        Date:  [DATE]                 EDD:   [DATE]
 Best:          36w 0d     Det. By:  LMP  ([DATE])          EDD:   [DATE]
Anatomy

 Stomach:               Appears normal, left   Bladder:                Appears normal
                        sided
Cervix Uterus Adnexa

 Cervix
 Not visualized (advanced GA >[34])
Impression

 Biophysical profile [DATE]
Recommendations

 Follow up BPP in 1 week.

## 2019-02-07 ENCOUNTER — Other Ambulatory Visit (HOSPITAL_COMMUNITY)
Admission: RE | Admit: 2019-02-07 | Discharge: 2019-02-07 | Disposition: A | Discharge status: Home / Self Care | Payer: Commercial Managed Care - PPO | Source: Ambulatory Visit | Attending: Obstetrics and Gynecology | Admitting: Obstetrics and Gynecology

## 2019-02-07 ENCOUNTER — Ambulatory Visit (INDEPENDENT_AMBULATORY_CARE_PROVIDER_SITE_OTHER): Payer: Commercial Managed Care - PPO | Admitting: Obstetrics and Gynecology

## 2019-02-07 ENCOUNTER — Telehealth (HOSPITAL_COMMUNITY): Payer: Self-pay | Admitting: *Deleted

## 2019-02-07 VITALS — BP 147/101 | HR 118 | Wt 246.0 lb

## 2019-02-07 DIAGNOSIS — Z3A36 36 weeks gestation of pregnancy: Secondary | ICD-10-CM | POA: Diagnosis not present

## 2019-02-07 DIAGNOSIS — O0990 Supervision of high risk pregnancy, unspecified, unspecified trimester: Secondary | ICD-10-CM

## 2019-02-07 DIAGNOSIS — Z6841 Body Mass Index (BMI) 40.0 and over, adult: Secondary | ICD-10-CM

## 2019-02-07 DIAGNOSIS — Z6791 Unspecified blood type, Rh negative: Secondary | ICD-10-CM

## 2019-02-07 DIAGNOSIS — O26899 Other specified pregnancy related conditions, unspecified trimester: Secondary | ICD-10-CM

## 2019-02-07 DIAGNOSIS — O133 Gestational [pregnancy-induced] hypertension without significant proteinuria, third trimester: Secondary | ICD-10-CM

## 2019-02-07 DIAGNOSIS — O26893 Other specified pregnancy related conditions, third trimester: Secondary | ICD-10-CM

## 2019-02-07 DIAGNOSIS — O99213 Obesity complicating pregnancy, third trimester: Secondary | ICD-10-CM

## 2019-02-07 DIAGNOSIS — O0993 Supervision of high risk pregnancy, unspecified, third trimester: Secondary | ICD-10-CM

## 2019-02-07 DIAGNOSIS — O99210 Obesity complicating pregnancy, unspecified trimester: Secondary | ICD-10-CM

## 2019-02-07 LAB — OB RESULTS CONSOLE GC/CHLAMYDIA: Gonorrhea: NEGATIVE

## 2019-02-07 NOTE — Telephone Encounter (Signed)
Preadmission screen  

## 2019-02-07 NOTE — Progress Notes (Signed)
Prenatal Visit Note Date: 02/07/2019 Clinic: [facility]  Subjective:  Madison Cook is a 23 y.o. G2P0010 at [redacted]w[redacted]d being seen today for ongoing prenatal care.  She is currently monitored for the following issues for this high-risk pregnancy and has Gestational hypertension; BMI 40.0-44.9, adult (HCC); Obesity in pregnancy; Supervision of high risk pregnancy, antepartum; Rh negative state in antepartum period; and History of influenza on their problem list.  Patient reports no s/s of pre-eclampsia and no OB complaints.   Contractions: Irregular. Vag. Bleeding: None.  Movement: Present. Denies leaking of fluid.   The following portions of the patient's history were reviewed and updated as appropriate: allergies, current medications, past family history, past medical history, past social history, past surgical history and problem list. Problem list updated.  Objective:   Vitals:   02/07/19 1128  BP: (!) 147/101  Pulse: (!) 118  Weight: 246 lb (111.6 kg)    Fetal Status:     Movement: Present  Presentation: Vertex  General:  Alert, oriented and cooperative. Patient is in no acute distress.  Skin: Skin is warm and dry. No rash noted.   Cardiovascular: Normal heart rate noted  Respiratory: Normal respiratory effort, no problems with respiration noted  Abdomen: Soft, gravid, appropriate for gestational age. Pain/Pressure: Present     Pelvic:  Cervical exam performed Dilation: Closed Effacement (%): 60 Station: Ballotable  Extremities: Normal range of motion.  Edema: Trace  Mental Status: Normal mood and affect. Normal behavior. Normal judgment and thought content.   Urinalysis:      Assessment and Plan:  Pregnancy: G2P0010 at [redacted]w[redacted]d  1. Supervision of high risk pregnancy, antepartum Routine care - Protein / creatinine ratio, urine - Comprehensive metabolic panel - CBC - Strep Gp B NAA - Cervicovaginal ancillary only( [place])  2. Gestational  hypertension, third trimester Pre-x precautions given. Surveillance labs today. bpp 8/8 yesterday, normal recent growth u/s. nst reactive (160 baseline, +accels, no decel, mod variability, toco quiet). Pt set up for 37/0 IOL. Rationale d/w her.  - Protein / creatinine ratio, urine - Comprehensive metabolic panel - CBC  3. Rh negative state in antepartum period Rhogam pp prn  4. Obesity in pregnancy  5. BMI 40.0-44.9, adult (HCC)  Preterm labor symptoms and general obstetric precautions including but not limited to vaginal bleeding, contractions, leaking of fluid and fetal movement were reviewed in detail with the patient. Please refer to After Visit Summary for other counseling recommendations.  Return in about 2 weeks (around 02/21/2019) for 14-17d bp check.   Creola Bing, MD

## 2019-02-08 ENCOUNTER — Telehealth (HOSPITAL_COMMUNITY): Payer: Self-pay | Admitting: *Deleted

## 2019-02-08 ENCOUNTER — Encounter (HOSPITAL_COMMUNITY): Payer: Self-pay | Admitting: *Deleted

## 2019-02-08 ENCOUNTER — Other Ambulatory Visit: Payer: Self-pay | Admitting: Advanced Practice Midwife

## 2019-02-08 LAB — COMPREHENSIVE METABOLIC PANEL
ALT: 13 IU/L (ref 0–32)
AST: 10 IU/L (ref 0–40)
Albumin/Globulin Ratio: 1.3 (ref 1.2–2.2)
Albumin: 3.3 g/dL — ABNORMAL LOW (ref 3.9–5.0)
Alkaline Phosphatase: 84 IU/L (ref 39–117)
BUN/Creatinine Ratio: 8 — ABNORMAL LOW (ref 9–23)
BUN: 6 mg/dL (ref 6–20)
Bilirubin Total: <0.2 mg/dL (ref 0.0–1.2)
CO2: 19 mmol/L — ABNORMAL LOW (ref 20–29)
Calcium: 9.3 mg/dL (ref 8.7–10.2)
Chloride: 103 mmol/L (ref 96–106)
Creatinine, Ser: 0.74 mg/dL (ref 0.57–1.00)
GFR calc Af Amer: 134 mL/min/{1.73_m2} (ref >59)
GFR calc non Af Amer: 116 mL/min/{1.73_m2} (ref >59)
Globulin, Total: 2.6 g/dL (ref 1.5–4.5)
Glucose: 90 mg/dL (ref 65–99)
Potassium: 3.8 mmol/L (ref 3.5–5.2)
Sodium: 138 mmol/L (ref 134–144)
Total Protein: 5.9 g/dL — ABNORMAL LOW (ref 6.0–8.5)

## 2019-02-08 LAB — CBC
Hematocrit: 35.9 % (ref 34.0–46.6)
Hemoglobin: 12 g/dL (ref 11.1–15.9)
MCH: 30.7 pg (ref 26.6–33.0)
MCHC: 33.4 g/dL (ref 31.5–35.7)
MCV: 92 fL (ref 79–97)
Platelets: 223 10*3/uL (ref 150–450)
RBC: 3.91 x10E6/uL (ref 3.77–5.28)
RDW: 13.2 % (ref 11.7–15.4)
WBC: 15.4 10*3/uL — ABNORMAL HIGH (ref 3.4–10.8)

## 2019-02-08 LAB — PROTEIN / CREATININE RATIO, URINE
Creatinine, Urine: 75.1 mg/dL
Protein, Ur: 14.5 mg/dL
Protein/Creat Ratio: 193 mg/g creat (ref 0–200)

## 2019-02-08 LAB — CERVICOVAGINAL ANCILLARY ONLY
Chlamydia: NEGATIVE
Neisseria Gonorrhea: NEGATIVE

## 2019-02-08 NOTE — Telephone Encounter (Signed)
Preadmission screen  

## 2019-02-09 ENCOUNTER — Other Ambulatory Visit: Payer: Self-pay | Admitting: Advanced Practice Midwife

## 2019-02-09 LAB — STREP GP B NAA: Strep Gp B NAA: POSITIVE — AB

## 2019-02-11 ENCOUNTER — Other Ambulatory Visit: Payer: Self-pay | Admitting: Obstetrics and Gynecology

## 2019-02-11 ENCOUNTER — Encounter: Payer: Self-pay | Admitting: Obstetrics & Gynecology

## 2019-02-11 ENCOUNTER — Encounter: Payer: Commercial Managed Care - PPO | Admitting: Obstetrics & Gynecology

## 2019-02-11 ENCOUNTER — Encounter: Payer: Self-pay | Admitting: Obstetrics and Gynecology

## 2019-02-11 ENCOUNTER — Other Ambulatory Visit: Payer: Self-pay

## 2019-02-11 ENCOUNTER — Other Ambulatory Visit (HOSPITAL_COMMUNITY)
Admission: RE | Admit: 2019-02-11 | Discharge: 2019-02-11 | Disposition: A | Discharge status: Home / Self Care | Payer: Commercial Managed Care - PPO | Source: Ambulatory Visit | Attending: Obstetrics and Gynecology | Admitting: Obstetrics and Gynecology

## 2019-02-11 DIAGNOSIS — Z1159 Encounter for screening for other viral diseases: Secondary | ICD-10-CM | POA: Insufficient documentation

## 2019-02-11 DIAGNOSIS — A6920 Lyme disease, unspecified: Secondary | ICD-10-CM

## 2019-02-11 DIAGNOSIS — O99820 Streptococcus B carrier state complicating pregnancy: Secondary | ICD-10-CM

## 2019-02-11 DIAGNOSIS — W57XXXA Bitten or stung by nonvenomous insect and other nonvenomous arthropods, initial encounter: Secondary | ICD-10-CM

## 2019-02-11 HISTORY — DX: Streptococcus B carrier state complicating pregnancy: O99.820

## 2019-02-11 MED ORDER — CEFUROXIME AXETIL 500 MG PO TABS: 500.0000 mg | ORAL_TABLET | Freq: Two times a day (BID) | ORAL | 0 refills | Status: AC

## 2019-02-11 NOTE — MAU Note (Signed)
Swab collected for COVID pre admission. No symptoms. Specimen sent to lab.

## 2019-02-11 NOTE — Telephone Encounter (Signed)
    MYCHART ENCOUNTER NOTE  Patient at [redacted]w[redacted]d with reported tick bite that occurred on 02/02/2019. On review of her attached pictures, there is concern about possible early localized Lyme disease as pictures show possible erythema migrans. Given pregnancy, patient was prescribed Cefuroxime Axetil 500 mg po bid x 14 days. She is scheduled for IOL in 2 days for [facility].  Patient was contacted,  advised to pick up prescription and take as directed.  Jaynie Collins, MD, FACOG Obstetrician & Gynecologist, [facility] for [employer], [facility]

## 2019-02-12 ENCOUNTER — Encounter (HOSPITAL_COMMUNITY): Payer: Self-pay | Admitting: *Deleted

## 2019-02-12 ENCOUNTER — Other Ambulatory Visit (HOSPITAL_COMMUNITY): Payer: Self-pay | Admitting: *Deleted

## 2019-02-12 ENCOUNTER — Other Ambulatory Visit: Payer: Self-pay

## 2019-02-12 ENCOUNTER — Ambulatory Visit (HOSPITAL_COMMUNITY): Payer: Commercial Managed Care - PPO | Admitting: *Deleted

## 2019-02-12 ENCOUNTER — Ambulatory Visit (HOSPITAL_COMMUNITY)
Admission: RE | Admit: 2019-02-12 | Discharge: 2019-02-12 | Disposition: A | Discharge status: Home / Self Care | Payer: Commercial Managed Care - PPO | Source: Ambulatory Visit | Attending: Maternal & Fetal Medicine | Admitting: Maternal & Fetal Medicine

## 2019-02-12 DIAGNOSIS — O360130 Maternal care for anti-D [Rh] antibodies, third trimester, not applicable or unspecified: Secondary | ICD-10-CM

## 2019-02-12 DIAGNOSIS — O99213 Obesity complicating pregnancy, third trimester: Secondary | ICD-10-CM

## 2019-02-12 DIAGNOSIS — A6920 Lyme disease, unspecified: Secondary | ICD-10-CM

## 2019-02-12 DIAGNOSIS — O133 Gestational [pregnancy-induced] hypertension without significant proteinuria, third trimester: Secondary | ICD-10-CM

## 2019-02-12 DIAGNOSIS — Z3A36 36 weeks gestation of pregnancy: Secondary | ICD-10-CM | POA: Diagnosis not present

## 2019-02-12 DIAGNOSIS — O139 Gestational [pregnancy-induced] hypertension without significant proteinuria, unspecified trimester: Secondary | ICD-10-CM | POA: Diagnosis present

## 2019-02-12 LAB — NOVEL CORONAVIRUS, NAA (HOSP ORDER, SEND-OUT TO REF LAB; TAT 18-24 HRS): SARS-CoV-2, NAA: NOT DETECTED

## 2019-02-12 IMAGING — US US MFM FETAL BPP WO NON STRESS
1 series · 13 of 28 positions shown · non-contrast
Comparison: none

[Series 1: us mfm fetal bpp wo non stress · 43 acquisitions, 13 frames shown]
[im 2/43]
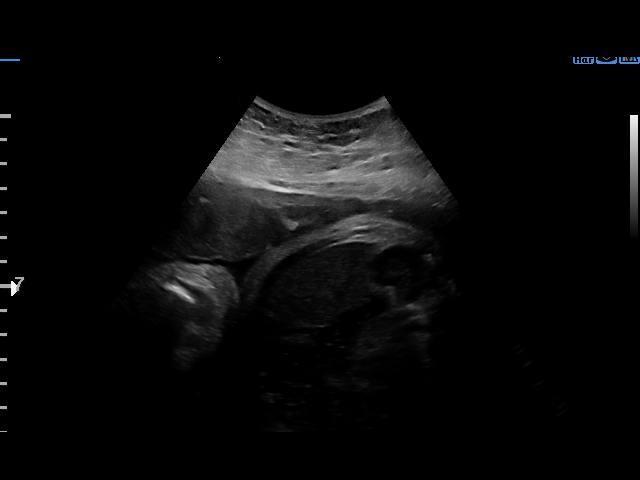
[im 5/43]
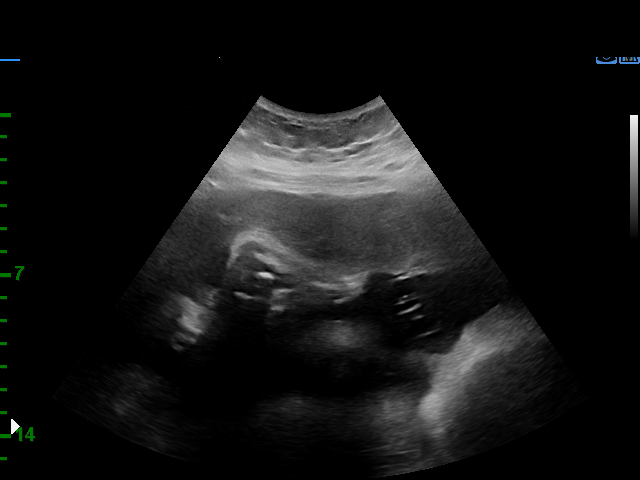
[im 8/43]
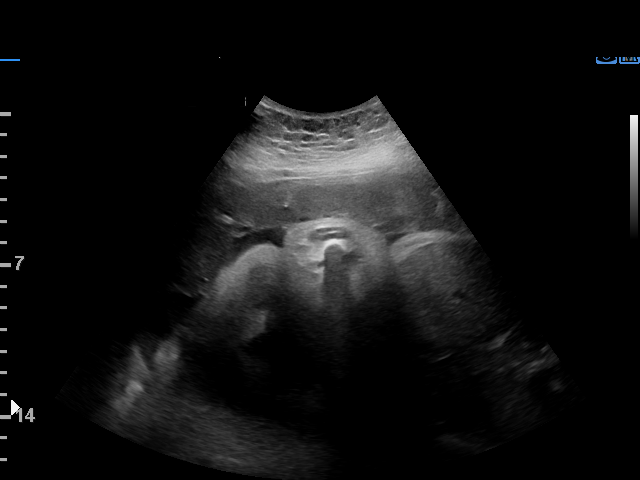
[im 11/43]
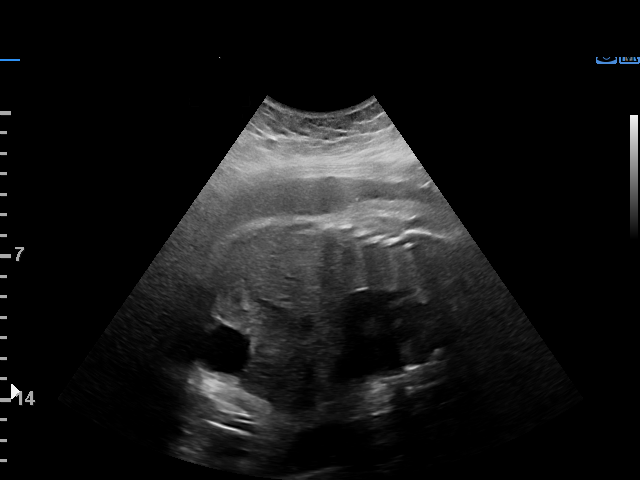
[im 15/43]
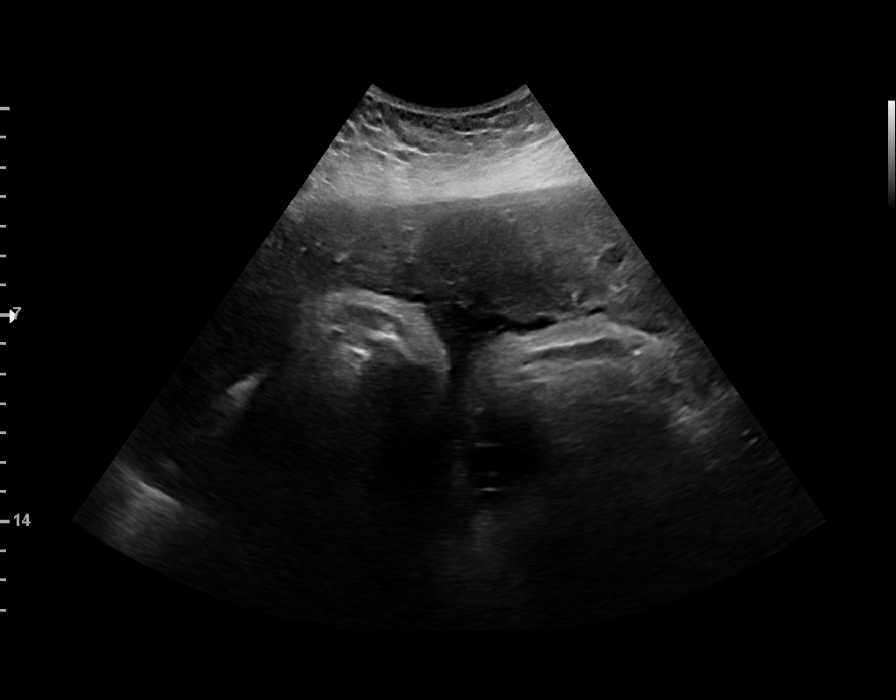
[im 18/43]
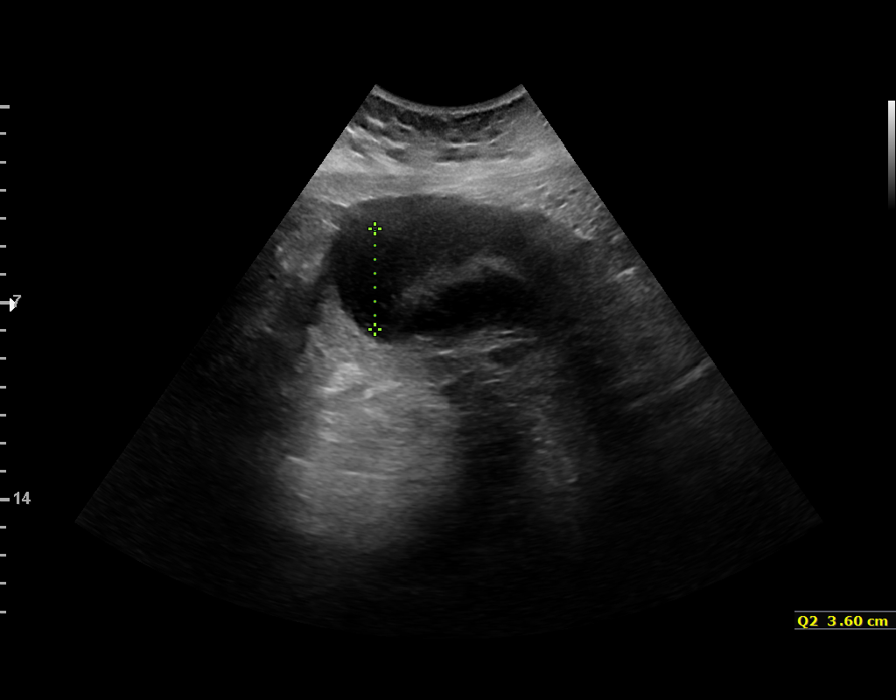
[im 22/43]
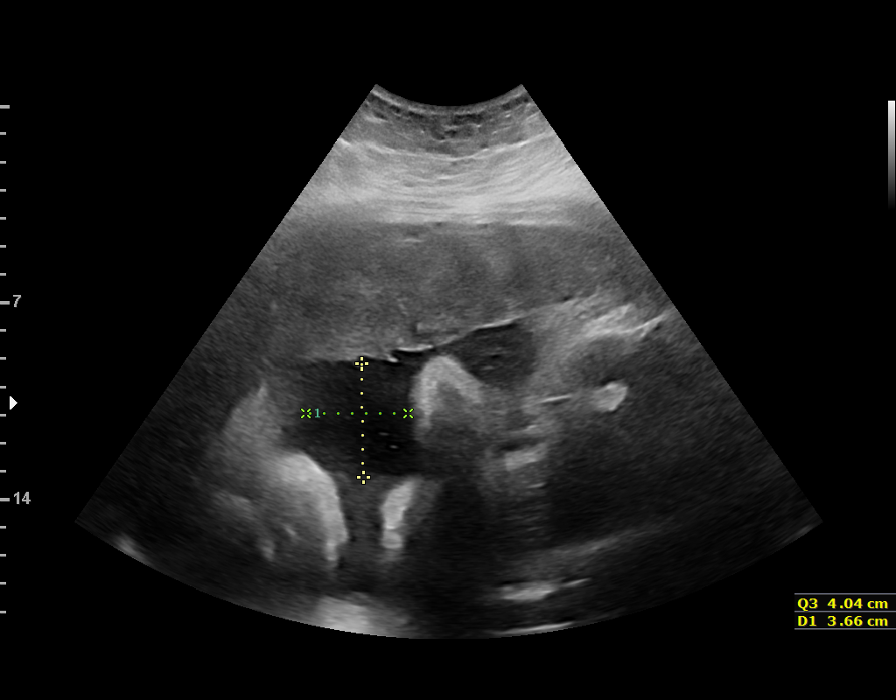
[im 25/43]
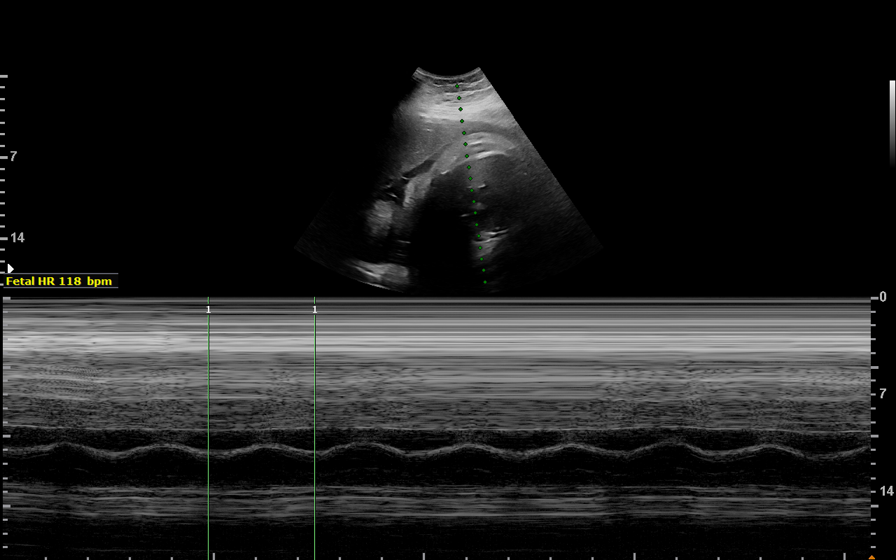
[im 29/43]
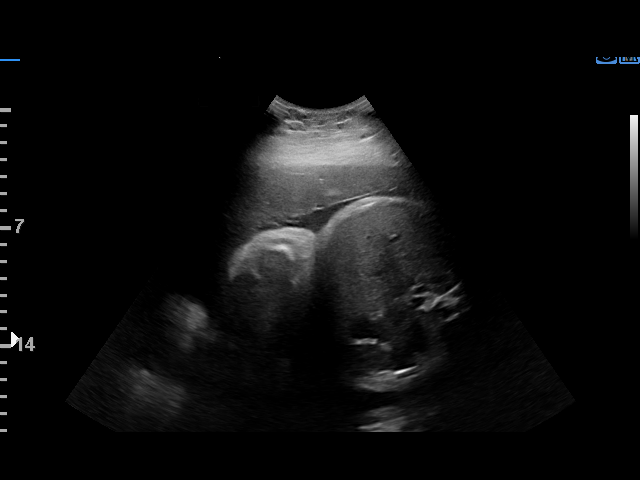
[im 32/43]
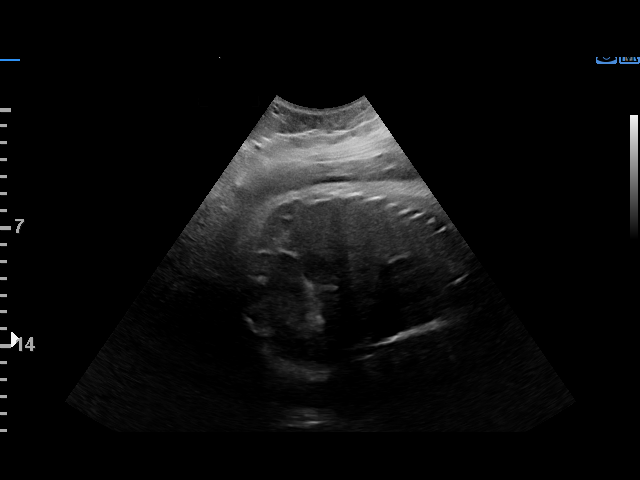
[im 35/43]
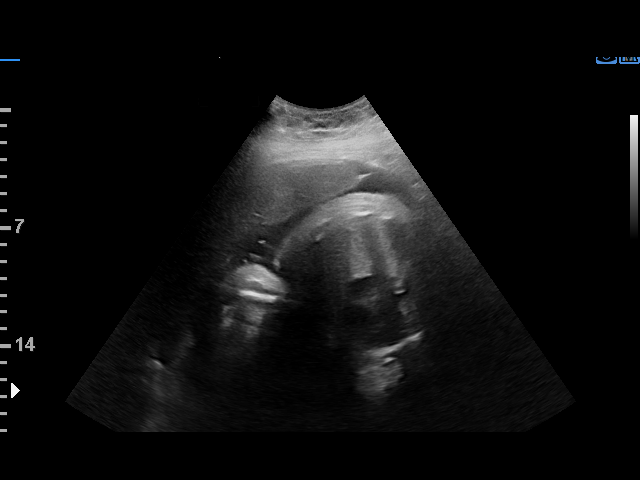
[im 38/43]
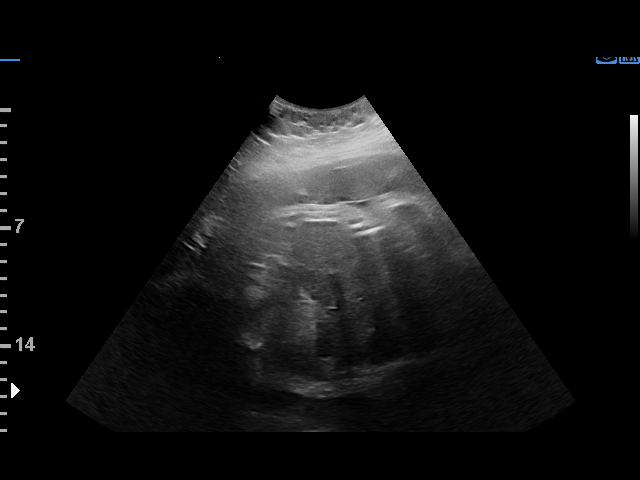
[im 41/43]
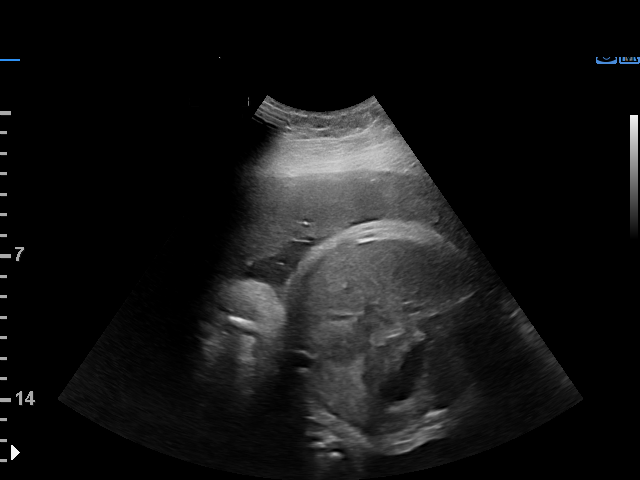

[13 of 28 positions shown; findings below may reference images not displayed]

[DB]

     STRESS                                            ATTENBERG
 ----------------------------------------------------------------------

 ----------------------------------------------------------------------
Indications

  36 weeks gestation of pregnancy
  Hypertension - Gestational (ASA)               [DB]
  Maternal care for anti-D Rh antibodies,      [DB]
  third trimester, unspecified (too weak to titer)
  Obesity complicating pregnancy, third          [DB]
  trimester [DB]
 ----------------------------------------------------------------------
Vital Signs

 BMI:
Fetal Evaluation

 Num Of Fetuses:          1
 Fetal Heart Rate(bpm):   122
 Cardiac Activity:        Observed
 Presentation:            Cephalic
 Placenta:                Anterior
 P. Cord Insertion:       Previously Visualized

 Amniotic Fluid
 AFI FV:      Within normal limits

 AFI Sum(cm)     %Tile       Largest Pocket(cm)
 13.08           47

 RUQ(cm)       RLQ(cm)       LUQ(cm)        LLQ(cm)

Biophysical Evaluation

 Amniotic F.V:   Pocket => 2 cm two         F. Tone:         Observed
                 planes
 F. Movement:    Observed                   Score:           [DATE]
 F. Breathing:   Observed
OB History

 Gravidity:    2         Term:   0        Prem:   0        SAB:   1
 TOP:          0       Ectopic:  0        Living: 0
Gestational Age

 LMP:           36w 6d        Date:  [DATE]                 EDD:   [DATE]
 Best:          36w 6d     Det. By:  LMP  ([DATE])          EDD:   [DATE]
Impression

 Biophysical profile [DATE]
Recommendations

 IOL scheduled tomorrow.

## 2019-02-13 ENCOUNTER — Inpatient Hospital Stay (HOSPITAL_COMMUNITY): Payer: Medicaid Other

## 2019-02-13 ENCOUNTER — Inpatient Hospital Stay (HOSPITAL_COMMUNITY)
Admission: AD | Admit: 2019-02-13 | Discharge: 2019-02-16 | DRG: 807 | Disposition: A | Discharge status: Home / Self Care | Payer: Medicaid Other | Attending: Obstetrics & Gynecology | Admitting: Obstetrics & Gynecology

## 2019-02-13 ENCOUNTER — Other Ambulatory Visit: Payer: Self-pay

## 2019-02-13 ENCOUNTER — Encounter (HOSPITAL_COMMUNITY): Payer: Self-pay | Admitting: *Deleted

## 2019-02-13 DIAGNOSIS — Z6791 Unspecified blood type, Rh negative: Secondary | ICD-10-CM

## 2019-02-13 DIAGNOSIS — Z6841 Body Mass Index (BMI) 40.0 and over, adult: Secondary | ICD-10-CM

## 2019-02-13 DIAGNOSIS — O139 Gestational [pregnancy-induced] hypertension without significant proteinuria, unspecified trimester: Secondary | ICD-10-CM | POA: Diagnosis present

## 2019-02-13 DIAGNOSIS — E669 Obesity, unspecified: Secondary | ICD-10-CM | POA: Diagnosis present

## 2019-02-13 DIAGNOSIS — O99214 Obesity complicating childbirth: Secondary | ICD-10-CM | POA: Diagnosis present

## 2019-02-13 DIAGNOSIS — O99824 Streptococcus B carrier state complicating childbirth: Secondary | ICD-10-CM | POA: Diagnosis present

## 2019-02-13 DIAGNOSIS — O134 Gestational [pregnancy-induced] hypertension without significant proteinuria, complicating childbirth: Secondary | ICD-10-CM | POA: Diagnosis present

## 2019-02-13 DIAGNOSIS — O26893 Other specified pregnancy related conditions, third trimester: Secondary | ICD-10-CM | POA: Diagnosis present

## 2019-02-13 DIAGNOSIS — O133 Gestational [pregnancy-induced] hypertension without significant proteinuria, third trimester: Secondary | ICD-10-CM

## 2019-02-13 DIAGNOSIS — O26899 Other specified pregnancy related conditions, unspecified trimester: Secondary | ICD-10-CM

## 2019-02-13 DIAGNOSIS — Z8759 Personal history of other complications of pregnancy, childbirth and the puerperium: Secondary | ICD-10-CM | POA: Diagnosis present

## 2019-02-13 DIAGNOSIS — O99210 Obesity complicating pregnancy, unspecified trimester: Secondary | ICD-10-CM | POA: Diagnosis present

## 2019-02-13 DIAGNOSIS — A6920 Lyme disease, unspecified: Secondary | ICD-10-CM

## 2019-02-13 DIAGNOSIS — O0990 Supervision of high risk pregnancy, unspecified, unspecified trimester: Secondary | ICD-10-CM

## 2019-02-13 DIAGNOSIS — Z3A37 37 weeks gestation of pregnancy: Secondary | ICD-10-CM

## 2019-02-13 DIAGNOSIS — O99820 Streptococcus B carrier state complicating pregnancy: Secondary | ICD-10-CM

## 2019-02-13 LAB — CBC
HCT: 36.8 % (ref 36.0–46.0)
Hemoglobin: 12.3 g/dL (ref 12.0–15.0)
MCH: 30 pg (ref 26.0–34.0)
MCHC: 33.4 g/dL (ref 30.0–36.0)
MCV: 89.8 fL (ref 80.0–100.0)
Platelets: 213 10*3/uL (ref 150–400)
RBC: 4.1 MIL/uL (ref 3.87–5.11)
RDW: 13.2 % (ref 11.5–15.5)
WBC: 15.2 10*3/uL — ABNORMAL HIGH (ref 4.0–10.5)
nRBC: 0 % (ref 0.0–0.2)

## 2019-02-13 LAB — RPR: RPR Ser Ql: NONREACTIVE

## 2019-02-13 LAB — COMPREHENSIVE METABOLIC PANEL
ALT: 13 U/L (ref 0–44)
AST: 18 U/L (ref 15–41)
Albumin: 2.7 g/dL — ABNORMAL LOW (ref 3.5–5.0)
Alkaline Phosphatase: 81 U/L (ref 38–126)
Anion gap: 12 (ref 5–15)
BUN: 7 mg/dL (ref 6–20)
CO2: 20 mmol/L — ABNORMAL LOW (ref 22–32)
Calcium: 9.8 mg/dL (ref 8.9–10.3)
Chloride: 106 mmol/L (ref 98–111)
Creatinine, Ser: 0.77 mg/dL (ref 0.44–1.00)
GFR calc Af Amer: >60 mL/min (ref >60)
GFR calc non Af Amer: >60 mL/min (ref >60)
Glucose, Bld: 99 mg/dL (ref 70–99)
Potassium: 3.9 mmol/L (ref 3.5–5.1)
Sodium: 138 mmol/L (ref 135–145)
Total Bilirubin: 0.4 mg/dL (ref 0.3–1.2)
Total Protein: 6.2 g/dL — ABNORMAL LOW (ref 6.5–8.1)

## 2019-02-13 LAB — PROTEIN / CREATININE RATIO, URINE
Creatinine, Urine: 57.82 mg/dL
Protein Creatinine Ratio: 0.19 mg/mg{Cre} — ABNORMAL HIGH (ref 0.00–0.15)
Total Protein, Urine: 11 mg/dL

## 2019-02-13 MED ORDER — SOD CITRATE-CITRIC ACID 500-334 MG/5ML PO SOLN: 30.0000 mL | ORAL | Status: DC | PRN

## 2019-02-13 MED ORDER — SODIUM CHLORIDE 0.9 % IV SOLN
5.0000 10*6.[IU] | Freq: Once | INTRAVENOUS | Status: AC
  Administered 2019-02-13: 5 10*6.[IU] via INTRAVENOUS
  Filled 2019-02-13: qty 5

## 2019-02-13 MED ORDER — OXYTOCIN 40 UNITS IN NORMAL SALINE INFUSION - SIMPLE MED
2.5000 [IU]/h | INTRAVENOUS | Status: DC
  Administered 2019-02-14: 2.5 [IU]/h via INTRAVENOUS
  Filled 2019-02-13: qty 1000

## 2019-02-13 MED ORDER — MISOPROSTOL 50MCG HALF TABLET
50.0000 ug | ORAL_TABLET | ORAL | Status: DC | PRN
  Administered 2019-02-13: 50 ug via BUCCAL
  Filled 2019-02-13: qty 1

## 2019-02-13 MED ORDER — ONDANSETRON HCL 4 MG/2ML IJ SOLN
4.0000 mg | Freq: Four times a day (QID) | INTRAMUSCULAR | Status: DC | PRN
  Administered 2019-02-14: 4 mg via INTRAVENOUS
  Filled 2019-02-13: qty 2

## 2019-02-13 MED ORDER — CEFUROXIME AXETIL 250 MG PO TABS
500.0000 mg | ORAL_TABLET | Freq: Two times a day (BID) | ORAL | Status: DC
  Administered 2019-02-13 – 2019-02-14 (×2): 500 mg via ORAL
  Filled 2019-02-13: qty 2
  Filled 2019-02-13: qty 1
  Filled 2019-02-13 (×3): qty 2

## 2019-02-13 MED ORDER — FENTANYL-BUPIVACAINE-NACL 0.5-0.125-0.9 MG/250ML-% EP SOLN
12.0000 mL/h | EPIDURAL | Status: DC | PRN
  Filled 2019-02-13: qty 250

## 2019-02-13 MED ORDER — NON FORMULARY: 500.0000 mg | Freq: Two times a day (BID) | Status: DC

## 2019-02-13 MED ORDER — LACTATED RINGERS IV SOLN
INTRAVENOUS | Status: DC
  Administered 2019-02-13 – 2019-02-14 (×5): via INTRAVENOUS

## 2019-02-13 MED ORDER — TERBUTALINE SULFATE 1 MG/ML IJ SOLN: 0.2500 mg | Freq: Once | INTRAMUSCULAR | Status: DC | PRN

## 2019-02-13 MED ORDER — LACTATED RINGERS IV SOLN: 500.0000 mL | Freq: Once | INTRAVENOUS | Status: DC

## 2019-02-13 MED ORDER — HYDRALAZINE HCL 20 MG/ML IJ SOLN
5.0000 mg | INTRAMUSCULAR | Status: DC | PRN
  Administered 2019-02-13: 5 mg via INTRAVENOUS
  Filled 2019-02-13: qty 1

## 2019-02-13 MED ORDER — HYDRALAZINE HCL 20 MG/ML IJ SOLN: 10.0000 mg | INTRAMUSCULAR | Status: DC | PRN

## 2019-02-13 MED ORDER — DIPHENHYDRAMINE HCL 50 MG/ML IJ SOLN: 12.5000 mg | INTRAMUSCULAR | Status: DC | PRN

## 2019-02-13 MED ORDER — FENTANYL CITRATE (PF) 100 MCG/2ML IJ SOLN
50.0000 ug | INTRAMUSCULAR | Status: AC | PRN
  Administered 2019-02-13 – 2019-02-14 (×4): 50 ug via INTRAVENOUS
  Filled 2019-02-13 (×4): qty 2

## 2019-02-13 MED ORDER — EPHEDRINE 5 MG/ML INJ: 10.0000 mg | INTRAVENOUS | Status: DC | PRN

## 2019-02-13 MED ORDER — OXYTOCIN BOLUS FROM INFUSION
500.0000 mL | Freq: Once | INTRAVENOUS | Status: AC
  Administered 2019-02-14: 500 mL via INTRAVENOUS

## 2019-02-13 MED ORDER — LIDOCAINE HCL (PF) 1 % IJ SOLN: 30.0000 mL | INTRAMUSCULAR | Status: DC | PRN

## 2019-02-13 MED ORDER — PHENYLEPHRINE 40 MCG/ML (10ML) SYRINGE FOR IV PUSH (FOR BLOOD PRESSURE SUPPORT): 80.0000 ug | PREFILLED_SYRINGE | INTRAVENOUS | Status: DC | PRN

## 2019-02-13 MED ORDER — LACTATED RINGERS IV SOLN
500.0000 mL | INTRAVENOUS | Status: DC | PRN
  Administered 2019-02-14: 200 mL via INTRAVENOUS

## 2019-02-13 MED ORDER — PENICILLIN G 3 MILLION UNITS IVPB - SIMPLE MED
3.0000 10*6.[IU] | INTRAVENOUS | Status: DC
  Administered 2019-02-13 – 2019-02-14 (×8): 3 10*6.[IU] via INTRAVENOUS
  Filled 2019-02-13 (×9): qty 100

## 2019-02-13 MED ORDER — LABETALOL HCL 5 MG/ML IV SOLN
40.0000 mg | INTRAVENOUS | Status: DC | PRN
  Administered 2019-02-14: 40 mg via INTRAVENOUS
  Filled 2019-02-13: qty 8

## 2019-02-13 MED ORDER — LABETALOL HCL 5 MG/ML IV SOLN
20.0000 mg | INTRAVENOUS | Status: DC | PRN
  Administered 2019-02-14: 20 mg via INTRAVENOUS
  Filled 2019-02-13: qty 4

## 2019-02-13 MED ORDER — OXYTOCIN 40 UNITS IN NORMAL SALINE INFUSION - SIMPLE MED
1.0000 m[IU]/min | INTRAVENOUS | Status: DC
  Administered 2019-02-13: 2 m[IU]/min via INTRAVENOUS

## 2019-02-13 MED ORDER — MISOPROSTOL 25 MCG QUARTER TABLET
25.0000 ug | ORAL_TABLET | ORAL | Status: DC | PRN
  Administered 2019-02-13 (×2): 25 ug via VAGINAL
  Filled 2019-02-13 (×2): qty 1

## 2019-02-13 MED ORDER — ACETAMINOPHEN 325 MG PO TABS
650.0000 mg | ORAL_TABLET | ORAL | Status: DC | PRN
  Administered 2019-02-14: 650 mg via ORAL
  Filled 2019-02-13 (×2): qty 2

## 2019-02-13 MED ORDER — TERBUTALINE SULFATE 1 MG/ML IJ SOLN
0.2500 mg | Freq: Once | INTRAMUSCULAR | Status: AC | PRN
  Administered 2019-02-13: 0.25 mg via SUBCUTANEOUS
  Filled 2019-02-13: qty 1

## 2019-02-13 NOTE — Progress Notes (Signed)
Patient ID: Madison Cook, female   DOB: September 10, 1996, 23 y.o.   MRN: [id number]  Doing well; feeling mild cramping; s/p cytotec x 2 doses  BP 145/100, 140/85, P 88 FHR 120-125, +accels, no decels, Cat 1 Ctx q 3 mins, mild Cx still FT but softer per RN  IUP@37 .0wks gHTN Unfavorable cx  May try cervical foley at time of next cytotec dosing  Arabella Merles [facility] 02/13/2019 2:32 PM

## 2019-02-13 NOTE — H&P (Signed)
Madison RedheadCynthia Cook is a 23 y.o. female G1 @ 37.0wks by LMP and confirmed with 7wk scan presenting for IOL for gHTN. Denies leaking, bleeding, H/A, N/V or visual disturbances. Her preg has been followed by the [facility] office and has been remarkable for:  # [facility] [facility] # gHTN, elevated BPs noted consistently since 28wks, however at 17wks it was 134/88 (unable to find additional values) # Rh neg # GBS pos # obesity # recent tick bite May 2, showing signs of erythema migrans; stable in size since started Ceftin on May 11, nonpruritic  OB History    Gravida  2   Para      Term      Preterm      AB  1   Living  0     SAB  1   TAB      Ectopic      Multiple      Live Births             Past Medical History:  Diagnosis Date  . Asthma   . Pregnancy induced hypertension    Past Surgical History:  Procedure Laterality Date  . NO PAST SURGERIES    . WISDOM TOOTH EXTRACTION     Family History: family history is not on file. Social History:  reports that she has never smoked. She has never used smokeless tobacco. She reports that she does not drink alcohol or use drugs.     Maternal Diabetes: No Genetic Screening: Normal Maternal Ultrasounds/Referrals: Normal Fetal Ultrasounds or other Referrals:  None Maternal Substance Abuse:  No Significant Maternal Medications:  Meds include: Other: Ceftin for recent tick bite/potential early Lyme dx Significant Maternal Lab Results:  Lab values include: Group B Strep positive, Rh negative Other Comments:  None  ROS History Dilation: Fingertip Effacement (%): Thick Station: -3 Exam by:: Raliegh Ipatherine Stout RN Blood pressure (!) 144/98, pulse 100, temperature 98.2 F (36.8 C), temperature source Oral, resp. rate 20, height 5\' 3"  (1.6 m), weight 111.6 kg, last menstrual period 05/30/2018, unknown if currently breastfeeding. Exam Physical Exam  Constitutional: She is oriented to person, place, and time. She appears  well-developed.  HENT:  Head: Normocephalic.  Neck: Normal range of motion.  Cardiovascular: Normal rate.  Respiratory: Effort normal.  GI:  EFM 120-125, +accels, no decels, Cat 1 Irreg mild ctx  Musculoskeletal: Normal range of motion.  Neurological: She is alert and oriented to person, place, and time.  Skin: Skin is warm and dry.  R shoulder: approx 8cm area suspicious for erythema migrans  Psychiatric: She has a normal mood and affect. Her behavior is normal. Thought content normal.    Admit pre-e labs:  CBC    Component Value Date/Time   WBC 15.2 (H) 02/13/2019 0834   RBC 4.10 02/13/2019 0834   HGB 12.3 02/13/2019 0834   HGB 12.0 02/07/2019 1151   HCT 36.8 02/13/2019 0834   HCT 35.9 02/07/2019 1151   PLT 213 02/13/2019 0834   PLT 223 02/07/2019 1151   MCV 89.8 02/13/2019 0834   MCV 92 02/07/2019 1151   MCH 30.0 02/13/2019 0834   MCHC 33.4 02/13/2019 0834   RDW 13.2 02/13/2019 0834   RDW 13.2 02/07/2019 1151   CMP     Component Value Date/Time   NA 138 02/13/2019 0834   NA 138 02/07/2019 1151   K 3.9 02/13/2019 0834   CL 106 02/13/2019 0834   CO2 20 (L) 02/13/2019 16100834  GLUCOSE 99 02/13/2019 0834   BUN 7 02/13/2019 0834   BUN 6 02/07/2019 1151   CREATININE 0.77 02/13/2019 0834   CALCIUM 9.8 02/13/2019 0834   PROT 6.2 (L) 02/13/2019 0834   PROT 5.9 (L) 02/07/2019 1151   ALBUMIN 2.7 (L) 02/13/2019 0834   ALBUMIN 3.3 (L) 02/07/2019 1151   AST 18 02/13/2019 0834   ALT 13 02/13/2019 0834   ALKPHOS 81 02/13/2019 0834   BILITOT 0.4 02/13/2019 0834   BILITOT <0.2 02/07/2019 1151   GFRNONAA >60 02/13/2019 0834   GFRAA >60 02/13/2019 0834   Urine P/C ratio: 0.19  Prenatal labs: ABO, Rh: --/--/A NEG (05/13 8657) Antibody: PENDING (05/13 0835) Rubella: Immune (04/07 0000) RPR: Non Reactive (03/18 0809)  HBsAg: Negative (04/07 0000)  HIV: Non Reactive (03/18 0809)  GBS: Positive (05/07 1158)   Assessment/Plan: IUP@37 .0wks gHTN Unfavorable cx GBS  pos Possible early Lyme dx Rh neg  # Admit to Labor and Delivery # Has already received first dose of cytotec- plan on continued doses until able to pass cervical foley, then progress to Pit/AROM as indicated # PCN for GBS ppx # Continue Ceftin 500 bid for Lyme dx # gHTN: nl pre-e labs on admit; watch for severe range BPs/symptoms # Eval for Rhogam indication PP   Arabella Merles CNM 02/13/2019, 10:25 AM

## 2019-02-13 NOTE — Progress Notes (Signed)
Patient ID: Madison Cook, female   DOB: December 23, 1995, 23 y.o.   MRN: [id number]  CTSP for fetal decel to nadir of 50 x 4 mins total, following tachysystole. Was recovering as I arrived and after Terb had been given. Back to baseline 140s, but at times tracing maternal in 110s. BP also noted to be 160/115 and was given apresoline 5mg  (first dose). Will see how pt responds before giving dx of pre-e. Cervical foley still in place; will hold further doses of cytotec and plan for Pit when the foley comes out.  Arabella Merles CNM 02/13/2019  7:25 PM

## 2019-02-13 NOTE — Progress Notes (Signed)
Patient ID: Madison Cook, female   DOB: 04-Aug-1996, 23 y.o.   MRN: [id number]  Feeling well; no s/s pre-e; some cramping; receiving PCN dosing  BPs 129/74, 150/92 FHR 135-140s, +accels, no decels, Cat 1 Ctx irreg 2-4 mins Cx FT-1/50/soft/vtx -2  IUP@37 .0wks gHTN Cx unfavorable GBS pos  Cervical foley inserted without difficulty; 3rd dose of cytotec buccally; plan Pitocin when foley comes out  Arabella Merles CNM 02/13/2019 5:30 PM

## 2019-02-14 ENCOUNTER — Inpatient Hospital Stay (HOSPITAL_COMMUNITY): Payer: Medicaid Other | Admitting: Anesthesiology

## 2019-02-14 DIAGNOSIS — Z3A37 37 weeks gestation of pregnancy: Secondary | ICD-10-CM

## 2019-02-14 DIAGNOSIS — O139 Gestational [pregnancy-induced] hypertension without significant proteinuria, unspecified trimester: Secondary | ICD-10-CM

## 2019-02-14 DIAGNOSIS — O99214 Obesity complicating childbirth: Secondary | ICD-10-CM

## 2019-02-14 DIAGNOSIS — O99824 Streptococcus B carrier state complicating childbirth: Secondary | ICD-10-CM

## 2019-02-14 DIAGNOSIS — O134 Gestational [pregnancy-induced] hypertension without significant proteinuria, complicating childbirth: Secondary | ICD-10-CM

## 2019-02-14 LAB — CBC
HCT: 37.3 % (ref 36.0–46.0)
Hemoglobin: 12.5 g/dL (ref 12.0–15.0)
MCH: 30.1 pg (ref 26.0–34.0)
MCHC: 33.5 g/dL (ref 30.0–36.0)
MCV: 89.9 fL (ref 80.0–100.0)
Platelets: 247 10*3/uL (ref 150–400)
RBC: 4.15 MIL/uL (ref 3.87–5.11)
RDW: 13.2 % (ref 11.5–15.5)
WBC: 23.3 10*3/uL — ABNORMAL HIGH (ref 4.0–10.5)
nRBC: 0 % (ref 0.0–0.2)

## 2019-02-14 MED ORDER — IBUPROFEN 600 MG PO TABS
600.0000 mg | ORAL_TABLET | Freq: Four times a day (QID) | ORAL | Status: DC
  Administered 2019-02-14 – 2019-02-16 (×7): 600 mg via ORAL
  Filled 2019-02-14 (×7): qty 1

## 2019-02-14 MED ORDER — SIMETHICONE 80 MG PO CHEW: 80.0000 mg | CHEWABLE_TABLET | ORAL | Status: DC | PRN

## 2019-02-14 MED ORDER — COCONUT OIL OIL: 1.0000 "application " | TOPICAL_OIL | Status: DC | PRN

## 2019-02-14 MED ORDER — WITCH HAZEL-GLYCERIN EX PADS: 1.0000 "application " | MEDICATED_PAD | CUTANEOUS | Status: DC | PRN

## 2019-02-14 MED ORDER — ACETAMINOPHEN 325 MG PO TABS
650.0000 mg | ORAL_TABLET | ORAL | Status: DC | PRN
  Administered 2019-02-15: 650 mg via ORAL
  Filled 2019-02-14: qty 2

## 2019-02-14 MED ORDER — SODIUM CHLORIDE (PF) 0.9 % IJ SOLN
INTRAMUSCULAR | Status: DC | PRN
  Administered 2019-02-14: 12 mL/h via EPIDURAL

## 2019-02-14 MED ORDER — ZOLPIDEM TARTRATE 5 MG PO TABS: 5.0000 mg | ORAL_TABLET | Freq: Every evening | ORAL | Status: DC | PRN

## 2019-02-14 MED ORDER — EPHEDRINE 5 MG/ML INJ
10.0000 mg | INTRAVENOUS | Status: DC | PRN
  Filled 2019-02-14: qty 2

## 2019-02-14 MED ORDER — ONDANSETRON HCL 4 MG/2ML IJ SOLN: 4.0000 mg | INTRAMUSCULAR | Status: DC | PRN

## 2019-02-14 MED ORDER — DIPHENHYDRAMINE HCL 25 MG PO CAPS: 25.0000 mg | ORAL_CAPSULE | Freq: Four times a day (QID) | ORAL | Status: DC | PRN

## 2019-02-14 MED ORDER — LIDOCAINE HCL (PF) 1 % IJ SOLN
INTRAMUSCULAR | Status: DC | PRN
  Administered 2019-02-14: 5 mL via EPIDURAL

## 2019-02-14 MED ORDER — FENTANYL CITRATE (PF) 100 MCG/2ML IJ SOLN
100.0000 ug | INTRAMUSCULAR | Status: DC | PRN
  Administered 2019-02-14 (×4): 100 ug via INTRAVENOUS
  Filled 2019-02-14 (×4): qty 2

## 2019-02-14 MED ORDER — ONDANSETRON HCL 4 MG PO TABS: 4.0000 mg | ORAL_TABLET | ORAL | Status: DC | PRN

## 2019-02-14 MED ORDER — TETANUS-DIPHTH-ACELL PERTUSSIS 5-2.5-18.5 LF-MCG/0.5 IM SUSP: 0.5000 mL | Freq: Once | INTRAMUSCULAR | Status: DC

## 2019-02-14 MED ORDER — PRENATAL MULTIVITAMIN CH
1.0000 | ORAL_TABLET | Freq: Every day | ORAL | Status: DC
  Administered 2019-02-15 – 2019-02-16 (×2): 1 via ORAL
  Filled 2019-02-14 (×2): qty 1

## 2019-02-14 MED ORDER — DIBUCAINE (PERIANAL) 1 % EX OINT: 1.0000 "application " | TOPICAL_OINTMENT | CUTANEOUS | Status: DC | PRN

## 2019-02-14 MED ORDER — BENZOCAINE-MENTHOL 20-0.5 % EX AERO
1.0000 "application " | INHALATION_SPRAY | CUTANEOUS | Status: DC | PRN
  Administered 2019-02-15: 1 via TOPICAL
  Filled 2019-02-14: qty 56

## 2019-02-14 MED ORDER — PHENYLEPHRINE 40 MCG/ML (10ML) SYRINGE FOR IV PUSH (FOR BLOOD PRESSURE SUPPORT): 80.0000 ug | PREFILLED_SYRINGE | INTRAVENOUS | Status: DC | PRN

## 2019-02-14 MED ORDER — SENNOSIDES-DOCUSATE SODIUM 8.6-50 MG PO TABS
2.0000 | ORAL_TABLET | ORAL | Status: DC
  Administered 2019-02-14 – 2019-02-16 (×2): 2 via ORAL
  Filled 2019-02-14 (×2): qty 2

## 2019-02-14 MED ORDER — LACTATED RINGERS IV SOLN: 500.0000 mL | Freq: Once | INTRAVENOUS | Status: DC

## 2019-02-14 MED ORDER — FENTANYL-BUPIVACAINE-NACL 0.5-0.125-0.9 MG/250ML-% EP SOLN: 12.0000 mL/h | EPIDURAL | Status: DC | PRN

## 2019-02-14 NOTE — Anesthesia Procedure Notes (Signed)
Epidural Patient location during procedure: OB Start time: 02/14/2019 10:38 AM End time: 02/14/2019 10:53 AM  Staffing Anesthesiologist: Trevor Iha, MD Performed: anesthesiologist   Preanesthetic Checklist Completed: patient identified, site marked, surgical consent, pre-op evaluation, timeout performed, IV checked, risks and benefits discussed and monitors and equipment checked  Epidural Patient position: sitting Prep: site prepped and draped and DuraPrep Patient monitoring: continuous pulse ox and blood pressure Approach: midline Location: L3-L4 Injection technique: LOR air  Needle:  Needle type: Tuohy  Needle gauge: 17 G Needle length: 9 cm and 9 Needle insertion depth: 8 cm Catheter type: closed end flexible Catheter size: 19 Gauge Catheter at skin depth: 14 cm Test dose: negative  Assessment Events: blood not aspirated, injection not painful, no injection resistance, negative IV test and no paresthesia  Additional Notes Patient identified. Risks/Benefits/Options discussed with patient including but not limited to bleeding, infection, nerve damage, paralysis, failed block, incomplete pain control, headache, blood pressure changes, nausea, vomiting, reactions to medication both or allergic, itching and postpartum back pain. Confirmed with bedside nurse the patient's most recent platelet count. Confirmed with patient that they are not currently taking any anticoagulation, have any bleeding history or any family history of bleeding disorders. Patient expressed understanding and wished to proceed. All questions were answered. Sterile technique was used throughout the entire procedure. Please see nursing notes for vital signs. Test dose was given through epidural needle and negative prior to continuing to dose epidural or start infusion. Warning signs of high block given to the patient including shortness of breath, tingling/numbness in hands, complete motor block, or any  concerning symptoms with instructions to call for help. Patient was given instructions on fall risk and not to get out of bed. All questions and concerns addressed with instructions to call with any issues.1  Attempt (S) . Patient tolerated procedure well.

## 2019-02-14 NOTE — Progress Notes (Signed)
Madison Cook is a 23 y.o. G2P0010 at [redacted]w[redacted]d by LMP admitted for induction of labor due to [facility].  Subjective: Pt pain improved but not resolved 1+ hour after epidural.  S/O in room for support. No h/a, epigastric pain, or visual disturbances.  Objective: BP (!) 152/105 Comment: new cuff given  Pulse (!) 104   Temp 99.1 F (37.3 C) (Oral)   Resp 19   Ht 5\' 3"  (1.6 m)   Wt 111.6 kg   LMP 05/30/2018   SpO2 98%   BMI 43.58 kg/m  No intake/output data recorded. No intake/output data recorded.  FHT:  FHR: 145 bpm, variability: moderate,  accelerations:  Present,  decelerations:  Present variable x 1 with pt supine, positive scalp stimulation during cervical exam UC:   irregular, every 2-5 minutes SVE:   Dilation: 8 Effacement (%): 90 Station: -2 Exam by:: L. Leftwich, CNM IUPC and FSE placed during exam without difficulty. Pt tolerated well. AROM of forebag with IUPC placement, clear fluid noted.  Labs: Lab Results  Component Value Date   WBC 23.3 (H) 02/14/2019   HGB 12.5 02/14/2019   HCT 37.3 02/14/2019   MCV 89.9 02/14/2019   PLT 247 02/14/2019    Assessment / Plan: Induction of labor due to [facility],  Protracted active phase at 8 cm  Labor: Internal monitors placed, IUPC to better evaluate adequacy of contractions.  Preeclampsia:  labs stable Fetal Wellbeing:  Category I Pain Control:  Epidural I/D:  GBS positive on PCN Anticipated MOD:  NSVD  Sharen Counter 02/14/2019, 11:39 AM

## 2019-02-14 NOTE — Progress Notes (Signed)
Patient ID: Madison Cook, female   DOB: 01-02-1996, 23 y.o.   MRN: [id number]  S/p cervical foley, now on Pitocin; received Fentanyl for pain  BP 143/96, P 108 FHR 140s, +accels, occ variables Ctx q 2-5 mins with Pit @ 93mu/min Cx 5/70/vtx -3 per RN exam  IUP@term  gHTN IOL process  Continue increasing Pit to achieve reg ctx  Madison Cook CNM 02/14/2019

## 2019-02-14 NOTE — Discharge Summary (Signed)
Postpartum Discharge Summary     Patient Name: Madison Cook DOB: November 19, 1995 MRN: [id number]  Date of admission: 02/13/2019 Delivering Provider: Sharen Counter A   Date of discharge: 02/16/2019  Admitting diagnosis: pregnancy Intrauterine pregnancy: [redacted]w[redacted]d     Secondary diagnosis:  Active Problems:   Gestational hypertension   Obesity in pregnancy   Rh negative state in antepartum period   GBS (group B Streptococcus carrier), +RV culture, currently pregnant   Early localized Lyme disease  Additional problems: None     Discharge diagnosis: Term Pregnancy Delivered                                                                                                Post partum procedures:none  Augmentation: AROM, Pitocin, Cytotec and Foley Balloon  Complications: None  Hospital course:  Induction of Labor With Vaginal Delivery   23 y.o. yo G2P0010 at [redacted]w[redacted]d was admitted to the hospital 02/13/2019 for induction of labor.  Indication for induction: Gestational hypertension.  Patient had an uncomplicated labor course as follows: Membrane Rupture Time/Date: 6:55 AM ,02/14/2019   Intrapartum Procedures: Episiotomy: None [1]                                         Lacerations:  2nd degree [3];Perineal [11];Labial [10]  Patient had delivery of a Viable infant.  Information for the patient's newborn:  Crissie, Leos [[id number]]  Delivery Method: Vag-Spont   02/14/2019  Details of delivery can be found in separate delivery note.  Patient had a routine postpartum course. Patient is discharged home 02/16/19.  Magnesium Sulfate recieved: No BMZ received: No  Physical exam  Vitals:   02/15/19 1740 02/15/19 2100 02/16/19 0609 02/16/19 0956  BP: 131/90 (!) 122/91 (!) 137/97 140/87  Pulse: 73 79 85 84  Resp: 18 18 18    Temp: 97.7 F (36.5 C) 98.6 F (37 C) 98.6 F (37 C)   TempSrc: Oral Oral Oral   SpO2:  98% 99%   Weight:      Height:       General: alert, cooperative  and no distress Lochia: appropriate Uterine Fundus: firm Incision: N/A DVT Evaluation: No evidence of DVT seen on physical exam. Labs: Lab Results  Component Value Date   WBC 23.3 (H) 02/14/2019   HGB 12.5 02/14/2019   HCT 37.3 02/14/2019   MCV 89.9 02/14/2019   PLT 247 02/14/2019   CMP Latest Ref Rng & Units 02/13/2019  Glucose 70 - 99 mg/dL 99  BUN 6 - 20 mg/dL 7  Creatinine 8.27 - 0.78 mg/dL 6.75  Sodium 449 - 201 mmol/L 138  Potassium 3.5 - 5.1 mmol/L 3.9  Chloride 98 - 111 mmol/L 106  CO2 22 - 32 mmol/L 20(L)  Calcium 8.9 - 10.3 mg/dL 9.8  Total Protein 6.5 - 8.1 g/dL 6.2(L)  Total Bilirubin 0.3 - 1.2 mg/dL 0.4  Alkaline Phos 38 - 126 U/L 81  AST 15 - 41 U/L 18  ALT 0 - 44 U/L  13    Discharge instruction: per After Visit Summary and "Baby and Me Booklet".  After visit meds:  Allergies as of 02/16/2019   No Known Allergies     Medication List    STOP taking these medications   aspirin EC 81 MG tablet     TAKE these medications   cefUROXime 500 MG tablet Commonly known as:  CEFTIN Take 1 tablet (500 mg total) by mouth 2 (two) times daily with a meal for 14 days.   enalapril 10 MG tablet Commonly known as:  VASOTEC Take 1 tablet (10 mg total) by mouth daily.   ibuprofen 800 MG tablet Commonly known as:  ADVIL Take 1 tablet (800 mg total) by mouth every 8 (eight) hours as needed.   prenatal multivitamin Tabs tablet Take 1 tablet by mouth daily at 12 noon.       Diet: routine diet  Activity: Advance as tolerated. Pelvic rest for 6 weeks.   Outpatient follow up:3 day for blood pressure check    Patient with GHTN. Vasotec started and then increased to 10mg  on day of discharge. Patient has BP cuff at home.  BP range while here: 122-141/85-103   Follow up Appt: Future Appointments  Date Time Provider Department Center  02/21/2019  2:00 PM [facility] NURSE [facility]   Follow up Visit:  Please schedule this patient for PP visit in: 4  weeks Low risk pregnancy complicated by: GHTN at term Delivery mode:  SVD Anticipated Birth Control:  undecided PP Procedures needed: none  Schedule Integrated BH visit: no Provider: Any provider  Newborn Data: Live born female  Birth Weight: 6 lb 3.1 oz (2809 g) APGAR: 7, 5  Newborn Delivery   Birth date/time:  02/14/2019 18:49:00 Delivery type:  Vaginal, Spontaneous    Mom A negative, Baby A positive. Rhogam administered 02/15/2019 at 1339.   Baby Feeding: Breast Disposition:home with mother  Thressa ShellerHeather Hogan DNP, CNM  02/16/19  10:36 AM

## 2019-02-14 NOTE — Progress Notes (Signed)
Patient ID: Madison Cook, female   DOB: [birth year], 23 y.o.   MRN: [id number]  Lying on side, rec'd Fentanyl x 4 doses- wants to avoid epidural if possible; doing well  BPs 147/97, 152/93, 151/94 FHR 130s, +accels, occ variables Ctx q 2-3 mins with Pit at 71mu/min Cx per RN 7/80/vtx -2 at 0340  IUP@37 .1wks gHTN Active labor GBS pos  Keep ctx reg with Pitocin titration Check cx in next hour or so  Arabella Merles CNM 02/14/2019

## 2019-02-14 NOTE — Progress Notes (Signed)
Madison Cook is a 23 y.o. G2P0010 at [redacted]w[redacted]d by LMP admitted for induction of labor due to [facility].  Subjective: Pt comfortable with epidural.  Feeling rectal pressure with contractions.  Objective: BP (!) 157/93   Pulse 99   Temp 98.7 F (37.1 C) (Oral)   Resp 18   Ht 5\' 3"  (1.6 m)   Wt 111.6 kg   LMP 05/30/2018   SpO2 96%   BMI 43.58 kg/m  No intake/output data recorded. Total I/O In: -  Out: 250 [Urine:250]  FHT:  FHR: 135 bpm, variability: moderate,  accelerations:  Present,  decelerations:  Present occasional variables UC:   regular, every 3-4 minutes SVE:   Dilation: 10 Effacement (%): 100 Station: Plus 1 Exam by:: K.Kirkman, RN Job Founds, RN  Labs: Lab Results  Component Value Date   WBC 23.3 (H) 02/14/2019   HGB 12.5 02/14/2019   HCT 37.3 02/14/2019   MCV 89.9 02/14/2019   PLT 247 02/14/2019    Assessment / Plan: Induction of labor due to gestational hypertension,  progressing well on pitocin  Labor: Progressing normally Preeclampsia:  labs stable Fetal Wellbeing:  Category II Pain Control:  Epidural I/D:  GBS neg Anticipated MOD:  NSVD  Sharen Counter 02/14/2019, 5:36 PM

## 2019-02-14 NOTE — Progress Notes (Signed)
Patient ID: Madison Cook, female   DOB: 1995-11-28, 23 y.o.   MRN: [id number]  Side-lying with peanut; coping very well w/ ctx and Fentanyl for pain; SROM @ 0655  BP 152/86, P 100 FHR 125, avg variability, occ mi variables Ctx q 2-3 mins with Pit @ 32mu/min Cx 7-8/80/vtx -2 per RN @ [phone]  IUP@37 .1wks gHTN Active labor/SROM  Keep ctx regular with Pit; anticipate now with SROM that labor will increase  Arabella Merles [facility] 02/14/2019

## 2019-02-14 NOTE — Progress Notes (Signed)
Madison Cook is a 23 y.o. G2P0010 at [redacted]w[redacted]d by LMP admitted for induction of labor due to [facility].  Subjective: Pt comfortable with epidural.  S/O in room for support.    Objective: BP (!) 132/99   Pulse 95   Temp 98.2 F (36.8 C) (Oral)   Resp 18   Ht 5\' 3"  (1.6 m)   Wt 111.6 kg   LMP 05/30/2018   SpO2 96%   BMI 43.58 kg/m  No intake/output data recorded. No intake/output data recorded.  FHT:  FHR: 135 bpm, variability: moderate,  accelerations:  Present,  decelerations:  Present repetitive early, intermittent variables, rare lates and one isolated prolonged decel down to 60s lasting 3 minutes.  Position change, fluid bolus, CNM to bedside, Pitocin off with resolution of prolonged and return to Category I FHR tracing. UC:   regular, every 2 minutes SVE:   Dilation: Lip/rim Effacement (%): 100 Station: 0, Plus 1 Exam by:: Job Founds, RN & Carmie Kanner, RN  Labs: Lab Results  Component Value Date   WBC 23.3 (H) 02/14/2019   HGB 12.5 02/14/2019   HCT 37.3 02/14/2019   MCV 89.9 02/14/2019   PLT 247 02/14/2019    Assessment / Plan: Induction of labor due to gestational hypertension,  progressing well on pitocin  Labor: Pitocin off for decelerations.  Will resume if FHR tracing improves and labor progress requires.  Plan to recheck in 1 hour. Preeclampsia:  labs stable Fetal Wellbeing:  Category II Pain Control:  Epidural I/D:  GBS positive Anticipated MOD:  NSVD  Sharen Counter 02/14/2019, 1:37 PM

## 2019-02-14 NOTE — Anesthesia Preprocedure Evaluation (Signed)
Anesthesia Evaluation  Patient identified by MRN, date of birth, ID band Patient awake    Reviewed: Allergy & Precautions, NPO status , Patient's Chart, lab work & pertinent test results  Airway Mallampati: III  TM Distance: >3 FB Neck ROM: Full    Dental no notable dental hx. (+) Teeth Intact   Pulmonary neg pulmonary ROS, asthma ,    Pulmonary exam normal breath sounds clear to auscultation       Cardiovascular Exercise Tolerance: Good hypertension, Pt. on medications and Pt. on home beta blockers negative cardio ROS Normal cardiovascular exam Rhythm:Regular Rate:Normal     Neuro/Psych negative neurological ROS  negative psych ROS   GI/Hepatic negative GI ROS, Neg liver ROS,   Endo/Other    Renal/GU Cr 0.77     Musculoskeletal   Abdominal (+) + obese,   Peds  Hematology Hgb 12.5 Plt 247   Anesthesia Other Findings   Reproductive/Obstetrics (+) Pregnancy                             Anesthesia Physical Anesthesia Plan  ASA: III  Anesthesia Plan:    Post-op Pain Management:    Induction:   PONV Risk Score and Plan:   Airway Management Planned:   Additional Equipment:   Intra-op Plan:   Post-operative Plan:   Informed Consent: I have reviewed the patients History and Physical, chart, labs and discussed the procedure including the risks, benefits and alternatives for the proposed anesthesia with the patient or authorized representative who has indicated his/her understanding and acceptance.     Dental advisory given  Plan Discussed with:   Anesthesia Plan Comments: (G1P0 w gestational HTN for LEA)        Anesthesia Quick Evaluation

## 2019-02-15 MED ORDER — CEFUROXIME AXETIL 250 MG PO TABS
500.0000 mg | ORAL_TABLET | Freq: Two times a day (BID) | ORAL | Status: DC
  Administered 2019-02-16 (×2): 500 mg via ORAL
  Filled 2019-02-15 (×3): qty 2

## 2019-02-15 MED ORDER — RHO D IMMUNE GLOBULIN 1500 UNIT/2ML IJ SOSY
300.0000 ug | PREFILLED_SYRINGE | Freq: Once | INTRAMUSCULAR | Status: AC
  Administered 2019-02-15: 300 ug via INTRAMUSCULAR
  Filled 2019-02-15: qty 2

## 2019-02-15 MED ORDER — ENALAPRIL MALEATE 5 MG PO TABS
5.0000 mg | ORAL_TABLET | Freq: Every day | ORAL | Status: DC
  Administered 2019-02-15 – 2019-02-16 (×2): 5 mg via ORAL
  Filled 2019-02-15 (×2): qty 1

## 2019-02-15 NOTE — Lactation Note (Signed)
This note was copied from a baby's chart. Lactation Consultation Note  Patient Name: Madison Cook [id number] Date: 02/15/2019 Reason for consult: Initial assessment;Early term 37-38.6wks;1st time breastfeeding P1, 8 hour female infant. Infant had 2 stools and one void since delivery. Mom is active on the [facility] program in [place]. Per mom, infant latched well few minutes in L&D and not latched or breastfeed since arrived in room infant very sleepy. Finney asked mom undress infant and do STS, infant became alert , but is  reluctant to latch at this time only holds breast in mouth. LC discussed hand expression and  Mom easily expressed colostrum ,infant took 9 ml of colostrum by spoon. Mom will continue to do STS and work towards latching infant to breast when infant is cuing. If infant is still reluctant to latch mom will hand express and give infant colostrum by spoon. Mom has evert nipples and colostrum is present, mom's breast  leaked through out her pregnancy. Mom knows to breastfeed according hunger cues, 8 to 12 times within 24 hours, Mom asked for DEBP  Kit she brought her medela  DEBP from home but need attachments LC gave DEBP Kit and explained how to use with her medela DEBP. LC discussed I & O. Reviewed Baby & Me book's Breastfeeding Basics.  Mom knows to call Nurse or [place] if she has any questions, concerns or need assistance with latching infant to breast. Mom made aware of O/P services, breastfeeding support groups, community resources, and our phone # for post-discharge questions.   Maternal Data Formula Feeding for Exclusion: No Has patient been taught Hand Expression?: Yes(Infant given 9 ml of colostrum by spoon. ) Does the patient have breastfeeding experience prior to this delivery?: No  Feeding Feeding Type: Breast Fed  LATCH Score Latch: Too sleepy or reluctant, no latch achieved, no sucking elicited.  Audible Swallowing: None  Type of Nipple: Everted at  rest and after stimulation  Comfort (Breast/Nipple): Soft / non-tender  Hold (Positioning): Assistance needed to correctly position infant at breast and maintain latch.  LATCH Score: 5  Interventions Interventions: Breast feeding basics reviewed;Assisted with latch;Skin to skin;Hand express;Support pillows;Adjust position;Expressed milk;Position options;Breast compression  Lactation Tools Discussed/Used WIC Program: Yes   Consult Status Consult Status: Follow-up Date: 02/15/19 Follow-up type: In-patient    [name] 02/15/2019, 2:59 AM

## 2019-02-15 NOTE — Anesthesia Postprocedure Evaluation (Signed)
Anesthesia Post Note  Patient: Madison Cook  Procedure(s) Performed: AN AD HOC LABOR EPIDURAL     Patient location during evaluation: Mother Baby Anesthesia Type: Epidural Level of consciousness: awake and alert Pain management: pain level controlled Vital Signs Assessment: post-procedure vital signs reviewed and stable Respiratory status: spontaneous breathing and nonlabored ventilation Cardiovascular status: blood pressure returned to baseline Postop Assessment: no headache, able to ambulate and no backache Anesthetic complications: no    Last Vitals:  Vitals:   02/14/19 2230 02/15/19 0330  BP: 135/84 140/89  Pulse: 91 81  Resp: 16 16  Temp: 37.1 C 36.9 C  SpO2: 99% 98%    Last Pain:  Vitals:   02/15/19 0621  TempSrc:   PainSc: 0-No pain   Pain Goal:                   [name]

## 2019-02-15 NOTE — Progress Notes (Signed)
Post Partum Day 1 Subjective: no complaints, up ad lib, voiding and tolerating PO  Objective: Blood pressure 140/89, pulse 81, temperature 98.4 F (36.9 C), temperature source Oral, resp. rate 16, height 5\' 3"  (1.6 m), weight 111.6 kg, last menstrual period 05/30/2018, SpO2 98 %, unknown if currently breastfeeding.  Physical Exam:  General: alert and cooperative Lochia: appropriate Uterine Fundus: firm Incision: N/A DVT Evaluation: No cords or calf tenderness. No significant calf/ankle edema.  Recent Labs    02/13/19 0834 02/14/19 1014  HGB 12.3 12.5  HCT 36.8 37.3    Assessment/Plan: Plan for discharge tomorrow, Breastfeeding and Contraception condoms Rhogam work-up ordered    LOS: 2 days   Vonzella Nipple 02/15/2019, 9:00 AM

## 2019-02-15 NOTE — Lactation Note (Signed)
This note was copied from a baby's chart. Lactation Consultation Note  Patient Name: Madison Cook [id number] Date: 02/15/2019 Reason for consult: Follow-up assessment;Mother's request P1, 10 hour female infant. Cuing to breastfeed mom asked for lC services. Mom latched infant on right breast using football hold position, LC  asked mom support breast using Cor U hold, bring infant chin first with latch, infant latched few swallows observed. Infant was still breastfeeding (5 minutes) when LC eft room. LC discussed with mom to do breast compression and rub infant shoulder with thumb keep infant stimulated to suckle at breast. Per mom, she feels only a tug but no pain with latch. Mom knows to call Nurse or LC if she has any further questions, concerns or need assistance with latching infant to breast.   Maternal Data Formula Feeding for Exclusion: No Has patient been taught Hand Expression?: Yes(Infant given 9 ml of colostrum by spoon. ) Does the patient have breastfeeding experience prior to this delivery?: No  Feeding Feeding Type: Breast Fed  LATCH Score Latch: Grasps breast easily, tongue down, lips flanged, rhythmical sucking.  Audible Swallowing: A few with stimulation  Type of Nipple: Everted at rest and after stimulation  Comfort (Breast/Nipple): Soft / non-tender  Hold (Positioning): Assistance needed to correctly position infant at breast and maintain latch.  LATCH Score: 8  Interventions Interventions: Assisted with latch;Adjust position;Support pillows  Lactation Tools Discussed/Used WIC Program: Yes   Consult Status Consult Status: Follow-up Date: 02/16/19 Follow-up type: In-patient    Danelle Earthly 02/15/2019, 5:30 AM

## 2019-02-15 NOTE — Progress Notes (Signed)
Notified Dr Sheran Fava about pt's BP's today: 124/90, 123/85. 140/91, 141/93 No headache, no blurred vision, No upper gastric pain, no clonus, DTR +  2 bilat.  Dr Earlene Plater stated she will put in orders for medication.

## 2019-02-16 MED ORDER — IBUPROFEN 800 MG PO TABS: 800.0000 mg | ORAL_TABLET | Freq: Three times a day (TID) | ORAL | 0 refills | Status: DC | PRN

## 2019-02-16 MED ORDER — ENALAPRIL MALEATE 10 MG PO TABS: 10.0000 mg | ORAL_TABLET | Freq: Every day | ORAL | 1 refills | Status: DC

## 2019-02-16 MED ORDER — ENALAPRIL MALEATE 5 MG PO TABS: 5.0000 mg | ORAL_TABLET | Freq: Once | ORAL | Status: DC

## 2019-02-16 NOTE — Lactation Note (Signed)
This note was copied from a baby's chart. Lactation Consultation Note  Patient Name: Madison Cook [id number] Date: 02/16/2019 Reason for consult: Difficult latch;1st time breastfeeding;Early term 37-38.6wks P1, 31 hour female infant, weight loss -1%. Per mom, infant still nasal and congested ,LC heard nasal sounds in room.  Mom concern infant is so angry and crying when want to breastfeed, LC reviewed early signs of hunger cues with mom, Encouraged mom to breastfeeding infant before she starts to cry. Infant is latching with wide mouth gape and tongue down , but is only  holding breast in mouth,  not suckling consistently or in rthymitic pattern. LC had infant suckle on gloved finger at first infant suckle was very faint only few times, infant was given 12 ml of colostrum by curve tip syringe that mom hand expressed. Infant begin suckle with more vigor and pull colostrum from syringe on her own. Mom will continue to work towards latching infant to breast and parents was taught how to use curve tip syringe to give infant back volume of EBM. Mom begin pumping with her personal Medela DEBP and colostrum was flowing in pump as LC left the room. Parents will continue to offer STS to infant. Mom knows to call Nurse or LC if she has any questions, concerns or ned assistance with latching infant to breast. Mom's plan: 1. Breastfeed infant according to hunger cues and on demand/ ask for help from Nurse or LC and  still work towards latching infant to breast. 2. Hand express/ and or use  her personal medela DEBP and give infant back EBM (volume). 3. Continue to offer STS.  Maternal Data    Feeding Feeding Type: Breast Milk  LATCH Score Latch: Repeated attempts needed to sustain latch, nipple held in mouth throughout feeding, stimulation needed to elicit sucking reflex.  Audible Swallowing: A few with stimulation  Type of Nipple: Everted at rest and after stimulation  Comfort  (Breast/Nipple): Soft / non-tender  Hold (Positioning): Assistance needed to correctly position infant at breast and maintain latch.  LATCH Score: 7  Interventions Interventions: Hand express;Expressed milk;DEBP;Skin to skin  Lactation Tools Discussed/Used     Consult Status Consult Status: Follow-up Date: 02/16/19 Follow-up type: In-patient    Danelle Earthly 02/16/2019, 3:00 AM

## 2019-02-16 NOTE — Lactation Note (Signed)
This note was copied from a baby's chart. Lactation Consultation Note  Patient Name: Girl Shandie Suell [id number] Date: 02/16/2019 Reason for consult: Follow-up assessment;Difficult latch Baby is 39 hours old/5% weight loss.  Mom states she continues to have difficulty latching baby.  She is pumping and spoon or syringe feeding expressed milk.  Assisted with positioning baby in football hold.  Baby fussy and unable to grasp tissue.  24 mm nipple shield applied.  Baby latched well and fed actively with many swallows noted.  Mom very pleased with feeding.  Discussed milk coming to volume and the prevention and treatment of engorgement.  Reviewed lactation outpatient services and encouraged to call prn.  Maternal Data    Feeding Feeding Type: Breast Fed  LATCH Score Latch: Grasps breast easily, tongue down, lips flanged, rhythmical sucking.(with nipple shield)  Audible Swallowing: Spontaneous and intermittent  Type of Nipple: Everted at rest and after stimulation  Comfort (Breast/Nipple): Soft / non-tender  Hold (Positioning): Assistance needed to correctly position infant at breast and maintain latch.  LATCH Score: 9  Interventions Interventions: Assisted with latch;Breast compression;Adjust position;Breast massage;Support pillows  Lactation Tools Discussed/Used Tools: Nipple Shields Nipple shield size: 24   Consult Status Consult Status: Complete Follow-up type: Call as needed    Huston Foley 02/16/2019, 10:34 AM

## 2019-02-17 LAB — TYPE AND SCREEN
ABO/RH(D): A NEG
Antibody Screen: POSITIVE
Unit division: 0
Unit division: 0

## 2019-02-17 LAB — RH IG WORKUP (INCLUDES ABO/RH)
ABO/RH(D): A NEG
Fetal Screen: NEGATIVE
Gestational Age(Wks): 37
Unit division: 0

## 2019-02-17 LAB — BPAM RBC
Blood Product Expiration Date: 202005272359
Blood Product Expiration Date: 202005272359
Unit Type and Rh: 600
Unit Type and Rh: 600

## 2019-02-18 ENCOUNTER — Telehealth: Payer: Self-pay | Admitting: Radiology

## 2019-02-18 NOTE — Telephone Encounter (Signed)
Left voicemail for patient to call [facility] to for scheduled appointments and also instructions for mychart virtual visit

## 2019-02-18 NOTE — Telephone Encounter (Signed)
Left message for patient to call [facility] for appointment information

## 2019-02-19 ENCOUNTER — Ambulatory Visit (HOSPITAL_COMMUNITY): Payer: Commercial Managed Care - PPO

## 2019-02-19 ENCOUNTER — Telehealth: Payer: Commercial Managed Care - PPO

## 2019-02-27 ENCOUNTER — Other Ambulatory Visit: Payer: Self-pay

## 2019-02-27 ENCOUNTER — Encounter: Payer: Commercial Managed Care - PPO | Admitting: Family Medicine

## 2019-02-27 ENCOUNTER — Telehealth: Payer: Commercial Managed Care - PPO

## 2019-02-27 VITALS — BP 119/95 | HR 106

## 2019-02-27 DIAGNOSIS — Z0130 Encounter for examination of blood pressure without abnormal findings: Secondary | ICD-10-CM

## 2019-02-27 NOTE — Progress Notes (Unsigned)
Pt states that 1st BP reading was 126/101 2nd BP reading was 119/95. Pt denies headaches, dizziness, and blurred vision. Advised pt to continue to take Vasotec 10 mg 1 tab daily.chiquita l wilson, CMA

## 2019-03-13 ENCOUNTER — Encounter: Payer: Self-pay | Admitting: Family Medicine

## 2019-03-13 ENCOUNTER — Telehealth (INDEPENDENT_AMBULATORY_CARE_PROVIDER_SITE_OTHER): Payer: Commercial Managed Care - PPO | Admitting: Family Medicine

## 2019-03-13 ENCOUNTER — Other Ambulatory Visit: Payer: Self-pay

## 2019-03-13 ENCOUNTER — Ambulatory Visit: Payer: Commercial Managed Care - PPO | Admitting: Family Medicine

## 2019-03-13 DIAGNOSIS — O9270 Unspecified disorders of lactation: Secondary | ICD-10-CM

## 2019-03-13 NOTE — Progress Notes (Signed)
TELEHEALTH VIRTUAL POSTPARTUM VISIT ENCOUNTER NOTE  I connected with Madison Cook  on 03/13/19 at  1:15 PM EDT by telephone at home and verified that I am speaking with the correct person using two identifiers.   I discussed the limitations, risks, security and privacy concerns of performing an evaluation and management service by telephone and the availability of in person appointments. I also discussed with the patient that there may be a patient responsible charge related to this service. The patient expressed understanding and agreed to proceed.  Appointment Date: 03/13/2019  OBGYN Clinic: [facility] [place]  Chief Complaint:  Chief Complaint  Patient presents with  . Postpartum Care    History of Present Illness: Madison Cook is a 23 y.o. Caucasian G2P0010 (Patient's last menstrual period was 05/30/2018.)  Madison Cook is a 23 y.o. [id number] female who presents for a postpartum visit. She is 4 weeks postpartum following a spontaneous vaginal delivery. I have fully reviewed the prenatal and intrapartum course. The delivery was at 37.1 gestational weeks.  Anesthesia: epidural. Postpartum course has been uncomplicated Baby's course has been uncompllicated. Baby is feeding by both breast and bottle - enfamil. Bleeding staining only. Bowel function is normal. Bladder function is normal. Patient is not sexually active. Contraception method is condoms. Postpartum depression screening:neg  Trouble with decreasing milk supply: Currently nursing at breast 2 times per day Giving 4-4.5 oz of formula every 3 hrs Longest time between feeding/draining breast - 3 hours Overnight nursing only every every 2-3hr  Milk came in and was making more than enough She was nursing her exclusively nursing her and then pumping 1-2 hrs after eating and patient was engorged. She would pump 5-6 oz.  1-1.5oz combined amount from both breast  4-6 times   Has tried Lactation cookies and increased fluids.   Pap  smear: needs pap now that 23 yo  Review of Systems:  Her 12 point review of systems is negative or as noted in the History of Present Illness.  Patient Active Problem List   Diagnosis Date Noted  . GBS (group B Streptococcus carrier), +RV culture, currently pregnant 02/11/2019  . Early localized Lyme disease 02/11/2019  . BMI 40.0-44.9, adult (HCC) 12/17/2018  . Obesity in pregnancy 12/17/2018  . Supervision of high risk pregnancy, antepartum 12/17/2018  . Rh negative state in antepartum period 12/17/2018  . History of influenza 12/17/2018  . Gestational hypertension 12/11/2018    Medications Madison Cook had no medications administered during this visit. Current Outpatient Medications  Medication Sig Dispense Refill  . Prenatal Vit-Fe Fumarate-FA (PRENATAL MULTIVITAMIN) TABS tablet Take 1 tablet by mouth daily at 12 noon.    Marland Kitchen. albuterol (VENTOLIN HFA) 108 (90 Base) MCG/ACT inhaler Inhale 2 puffs into the lungs every 6 (six) hours as needed for wheezing or shortness of breath.    . calcium carbonate (TUMS - DOSED IN MG ELEMENTAL CALCIUM) 500 MG chewable tablet Chew 2 tablets by mouth 4 (four) times daily as needed for indigestion or heartburn.    . enalapril (VASOTEC) 10 MG tablet Take 1 tablet (10 mg total) by mouth daily. (Patient not taking: Reported on 03/13/2019) 30 tablet 1  . ibuprofen (ADVIL) 800 MG tablet Take 1 tablet (800 mg total) by mouth every 8 (eight) hours as needed. (Patient not taking: Reported on 02/27/2019) 30 tablet 0   No current facility-administered medications for this visit.     Allergies Patient has no known allergies.  Physical Exam:  General:  Alert, oriented  and cooperative.   Mental Status: Normal mood and affect perceived. Normal judgment and thought content.  Rest of physical exam deferred due to type of encounter  PP Depression Screening:   Edinburgh Postnatal Depression Scale - 03/13/19 1324      Edinburgh Postnatal Depression Scale:  In  the Past 7 Days   I have been able to laugh and see the funny side of things.  0    I have looked forward with enjoyment to things.  0    I have blamed myself unnecessarily when things went wrong.  0    I have been anxious or worried for no good reason.  3    I have felt scared or panicky for no good reason.  0    Things have been getting on top of me.  0    I have been so unhappy that I have had difficulty sleeping.  0    I have felt sad or miserable.  0    I have been so unhappy that I have been crying.  0    The thought of harming myself has occurred to me.  0    Edinburgh Postnatal Depression Scale Total  3       Assessment:Patient is a 23 y.o. G2P0010 who is 4 weeks postpartum from a normal spontaneous vaginal delivery.  She is doing well.   Plan:  1. Postpartum care and examination Normal mood  2. Lactation disorder - See HPI- needs to see lactation. Recommend fengreek. Needs latch assessment and feel that SNS might be beneficial for breast stimulation and reinforce feeding at the breast/transfer milk.  Infant might have tongue tie  - Ambulatory referral to Lactation  Return in about 2 weeks (around 03/27/2019), or review laceration/stiches.  I discussed the assessment and treatment plan with the patient. The patient was provided an opportunity to ask questions and all were answered. The patient agreed with the plan and demonstrated an understanding of the instructions.   The patient was advised to call back or seek an in-person evaluation/go to the ED for any concerning postpartum symptoms.  I provided 15 minutes of non-face-to-face time during this encounter.   Caren Macadam, MD [facility] [employer] [facility]

## 2019-03-13 NOTE — Patient Instructions (Addendum)
Breastfeeding supplement:  Fenugreek 200mg  tablets Take 1 tablet 3 times daily and increase to 3 tablets three times daily       Breastfeeding  Choosing to breastfeed is one of the best decisions you can make for yourself and your baby. A change in hormones during pregnancy causes your breasts to make breast milk in your milk-producing glands. Hormones prevent breast milk from being released before your baby is born. They also prompt milk flow after birth. Once breastfeeding has begun, thoughts of your baby, as well as his or her sucking or crying, can stimulate the release of milk from your milk-producing glands. Benefits of breastfeeding Research shows that breastfeeding offers many health benefits for infants and mothers. It also offers a cost-free and convenient way to feed your baby. For your baby  Your first milk (colostrum) helps your baby's digestive system to function better.  Special cells in your milk (antibodies) help your baby to fight off infections.  Breastfed babies are less likely to develop asthma, allergies, obesity, or type 2 diabetes. They are also at lower risk for sudden infant death syndrome (SIDS).  Nutrients in breast milk are better able to meet your baby's needs compared to infant formula.  Breast milk improves your baby's brain development. For you  Breastfeeding helps to create a very special bond between you and your baby.  Breastfeeding is convenient. Breast milk costs nothing and is always available at the correct temperature.  Breastfeeding helps to burn calories. It helps you to lose the weight that you gained during pregnancy.  Breastfeeding makes your uterus return faster to its size before pregnancy. It also slows bleeding (lochia) after you give birth.  Breastfeeding helps to lower your risk of developing type 2 diabetes, osteoporosis, rheumatoid arthritis, cardiovascular disease, and breast, ovarian, uterine, and endometrial cancer later in  life. Breastfeeding basics Starting breastfeeding  Find a comfortable place to sit or lie down, with your neck and back well-supported.  Place a pillow or a rolled-up blanket under your baby to bring him or her to the level of your breast (if you are seated). Nursing pillows are specially designed to help support your arms and your baby while you breastfeed.  Make sure that your baby's tummy (abdomen) is facing your abdomen.  Gently massage your breast. With your fingertips, massage from the outer edges of your breast inward toward the nipple. This encourages milk flow. If your milk flows slowly, you may need to continue this action during the feeding.  Support your breast with 4 fingers underneath and your thumb above your nipple (make the letter "C" with your hand). Make sure your fingers are well away from your nipple and your baby's mouth.  Stroke your baby's lips gently with your finger or nipple.  When your baby's mouth is open wide enough, quickly bring your baby to your breast, placing your entire nipple and as much of the areola as possible into your baby's mouth. The areola is the colored area around your nipple. ? More areola should be visible above your baby's upper lip than below the lower lip. ? Your baby's lips should be opened and extended outward (flanged) to ensure an adequate, comfortable latch. ? Your baby's tongue should be between his or her lower gum and your breast.  Make sure that your baby's mouth is correctly positioned around your nipple (latched). Your baby's lips should create a seal on your breast and be turned out (everted).  It is common for your baby  to suck about 2-3 minutes in order to start the flow of breast milk. Latching Teaching your baby how to latch onto your breast properly is very important. An improper latch can cause nipple pain, decreased milk supply, and poor weight gain in your baby. Also, if your baby is not latched onto your nipple  properly, he or she may swallow some air during feeding. This can make your baby fussy. Burping your baby when you switch breasts during the feeding can help to get rid of the air. However, teaching your baby to latch on properly is still the best way to prevent fussiness from swallowing air while breastfeeding. Signs that your baby has successfully latched onto your nipple  Silent tugging or silent sucking, without causing you pain. Infant's lips should be extended outward (flanged).  Swallowing heard between every 3-4 sucks once your milk has started to flow (after your let-down milk reflex occurs).  Muscle movement above and in front of his or her ears while sucking. Signs that your baby has not successfully latched onto your nipple  Sucking sounds or smacking sounds from your baby while breastfeeding.  Nipple pain. If you think your baby has not latched on correctly, slip your finger into the corner of your baby's mouth to break the suction and place it between your baby's gums. Attempt to start breastfeeding again. Signs of successful breastfeeding Signs from your baby  Your baby will gradually decrease the number of sucks or will completely stop sucking.  Your baby will fall asleep.  Your baby's body will relax.  Your baby will retain a small amount of milk in his or her mouth.  Your baby will let go of your breast by himself or herself. Signs from you  Breasts that have increased in firmness, weight, and size 1-3 hours after feeding.  Breasts that are softer immediately after breastfeeding.  Increased milk volume, as well as a change in milk consistency and color by the fifth day of breastfeeding.  Nipples that are not sore, cracked, or bleeding. Signs that your baby is getting enough milk  Wetting at least 1-2 diapers during the first 24 hours after birth.  Wetting at least 5-6 diapers every 24 hours for the first week after birth. The urine should be clear or pale  yellow by the age of 5 days.  Wetting 6-8 diapers every 24 hours as your baby continues to grow and develop.  At least 3 stools in a 24-hour period by the age of 5 days. The stool should be soft and yellow.  At least 3 stools in a 24-hour period by the age of 7 days. The stool should be seedy and yellow.  No loss of weight greater than 10% of birth weight during the first 3 days of life.  Average weight gain of 4-7 oz (113-198 g) per week after the age of 4 days.  Consistent daily weight gain by the age of 5 days, without weight loss after the age of 2 weeks. After a feeding, your baby may spit up a small amount of milk. This is normal. Breastfeeding frequency and duration Frequent feeding will help you make more milk and can prevent sore nipples and extremely full breasts (breast engorgement). Breastfeed when you feel the need to reduce the fullness of your breasts or when your baby shows signs of hunger. This is called "breastfeeding on demand." Signs that your baby is hungry include:  Increased alertness, activity, or restlessness.  Movement of the head from  side to side.  Opening of the mouth when the corner of the mouth or cheek is stroked (rooting).  Increased sucking sounds, smacking lips, cooing, sighing, or squeaking.  Hand-to-mouth movements and sucking on fingers or hands.  Fussing or crying. Avoid introducing a pacifier to your baby in the first 4-6 weeks after your baby is born. After this time, you may choose to use a pacifier. Research has shown that pacifier use during the first year of a baby's life decreases the risk of sudden infant death syndrome (SIDS). Allow your baby to feed on each breast as long as he or she wants. When your baby unlatches or falls asleep while feeding from the first breast, offer the second breast. Because newborns are often sleepy in the first few weeks of life, you may need to awaken your baby to get him or her to feed. Breastfeeding times  will vary from baby to baby. However, the following rules can serve as a guide to help you make sure that your baby is properly fed:  Newborns (babies 994 weeks of age or younger) may breastfeed every 1-3 hours.  Newborns should not go without breastfeeding for longer than 3 hours during the day or 5 hours during the night.  You should breastfeed your baby a minimum of 8 times in a 24-hour period. Breast milk pumping     Pumping and storing breast milk allows you to make sure that your baby is exclusively fed your breast milk, even at times when you are unable to breastfeed. This is especially important if you go back to work while you are still breastfeeding, or if you are not able to be present during feedings. Your lactation consultant can help you find a method of pumping that works best for you and give you guidelines about how long it is safe to store breast milk. Caring for your breasts while you breastfeed Nipples can become dry, cracked, and sore while breastfeeding. The following recommendations can help keep your breasts moisturized and healthy:  Avoid using soap on your nipples.  Wear a supportive bra designed especially for nursing. Avoid wearing underwire-style bras or extremely tight bras (sports bras).  Air-dry your nipples for 3-4 minutes after each feeding.  Use only cotton bra pads to absorb leaked breast milk. Leaking of breast milk between feedings is normal.  Use lanolin on your nipples after breastfeeding. Lanolin helps to maintain your skin's normal moisture barrier. Pure lanolin is not harmful (not toxic) to your baby. You may also hand express a few drops of breast milk and gently massage that milk into your nipples and allow the milk to air-dry. In the first few weeks after giving birth, some women experience breast engorgement. Engorgement can make your breasts feel heavy, warm, and tender to the touch. Engorgement peaks within 3-5 days after you give birth. The  following recommendations can help to ease engorgement:  Completely empty your breasts while breastfeeding or pumping. You may want to start by applying warm, moist heat (in the shower or with warm, water-soaked hand towels) just before feeding or pumping. This increases circulation and helps the milk flow. If your baby does not completely empty your breasts while breastfeeding, pump any extra milk after he or she is finished.  Apply ice packs to your breasts immediately after breastfeeding or pumping, unless this is too uncomfortable for you. To do this: ? Put ice in a plastic bag. ? Place a towel between your skin and the bag. ?  Leave the ice on for 20 minutes, 2-3 times a day.  Make sure that your baby is latched on and positioned properly while breastfeeding. If engorgement persists after 48 hours of following these recommendations, contact your health care provider or a Advertising copywriterlactation consultant. Overall health care recommendations while breastfeeding  Eat 3 healthy meals and 3 snacks every day. Well-nourished mothers who are breastfeeding need an additional 450-500 calories a day. You can meet this requirement by increasing the amount of a balanced diet that you eat.  Drink enough water to keep your urine pale yellow or clear.  Rest often, relax, and continue to take your prenatal vitamins to prevent fatigue, stress, and low vitamin and mineral levels in your body (nutrient deficiencies).  Do not use any products that contain nicotine or tobacco, such as cigarettes and e-cigarettes. Your baby may be harmed by chemicals from cigarettes that pass into breast milk and exposure to secondhand smoke. If you need help quitting, ask your health care provider.  Avoid alcohol.  Do not use illegal drugs or marijuana.  Talk with your health care provider before taking any medicines. These include over-the-counter and prescription medicines as well as vitamins and herbal supplements. Some medicines that  may be harmful to your baby can pass through breast milk.  It is possible to become pregnant while breastfeeding. If birth control is desired, ask your health care provider about options that will be safe while breastfeeding your baby. Where to find more information: [employer]: www.llli.org Contact a health care provider if:  You feel like you want to stop breastfeeding or have become frustrated with breastfeeding.  Your nipples are cracked or bleeding.  Your breasts are red, tender, or warm.  You have: ? Painful breasts or nipples. ? A swollen area on either breast. ? A fever or chills. ? Nausea or vomiting. ? Drainage other than breast milk from your nipples.  Your breasts do not become full before feedings by the fifth day after you give birth.  You feel sad and depressed.  Your baby is: ? Too sleepy to eat well. ? Having trouble sleeping. ? More than 661 week old and wetting fewer than 6 diapers in a 24-hour period. ? Not gaining weight by 645 days of age.  Your baby has fewer than 3 stools in a 24-hour period.  Your baby's skin or the white parts of his or her eyes become yellow. Get help right away if:  Your baby is overly tired (lethargic) and does not want to wake up and feed.  Your baby develops an unexplained fever. Summary  Breastfeeding offers many health benefits for infant and mothers.  Try to breastfeed your infant when he or she shows early signs of hunger.  Gently tickle or stroke your baby's lips with your finger or nipple to allow the baby to open his or her mouth. Bring the baby to your breast. Make sure that much of the areola is in your baby's mouth. Offer one side and burp the baby before you offer the other side.  Talk with your health care provider or lactation consultant if you have questions or you face problems as you breastfeed. This information is not intended to replace advice given to you by your health care provider. Make  sure you discuss any questions you have with your health care provider. Document Released: 09/19/2005 Document Revised: 10/21/2016 Document Reviewed: 10/21/2016 Elsevier Interactive Patient Education  2019 [employer]

## 2019-03-18 ENCOUNTER — Ambulatory Visit: Payer: Self-pay

## 2019-03-18 NOTE — Lactation Note (Signed)
This note was copied from a baby's chart. Lactation Consultation Note  Patient Name: Madison Donathbbie May Dareen Cook ZOXWR'UToday's Date: 03/18/2019     03/18/2019  Name: Madison Cook MRN: [id number] Date of Birth: 02/14/2018 Gestational Age: Gestational Age: 1460w1d Birth Weight: 99.1 oz Weight today:    7 pounds 5.5 ounces (3332 grams) with clean preemie diaper  794 week old LPT infant presents today with mom and dad for feeding assessment. Infant 37w 1day GA and now 41w 5d GA.  Infant has gained 653 grams in the last 30 days with an average daily weight gain of 22 grams a day.   Infant self awakens about every 3-4 hours. She will BF about 4 x a day for 10 minutes. She is supplemented with a bottle of  Breast milk or formula taking 2-6 ounces per feeding. Usually 4 ounces per feeding. Infant drinks her milk quickly and is drooling with feeding. It often takes infant 45 minutes to take her bottle due to stopping and pausing.   Mom is pumping about every 2 hours and getting about an ounce per pumping. She is pumping for 5-10 minutes at a time. Discussed supply and demand and importance of pumping to empty the breast. Mom has ordered Fenugreek per MD. Discussed dosage and side effects.    Parents report infant is BF a few times a day and then supplementing with each feeding. Parents report infant is drooling on the bottle and feeds very quickly. Discussed using a slower flow nipple and paced bottle feeding.   Infant latched to the right breast after a few minutes of trying. Mom reports infant needs to use the # 24 NS with most feedings. Parents keep infant upright after feedings for about 20 minutes after feeding. Infant with some spitting, not consistently.   Infant with thick labial frenulum that inserts at the bottom of the gum ridge. Upper lip with some resistance to flanging. Lip flanges ok on the breast. Infant with anterior/posterior lingual frenulum noted. Infant with strong suckle on gloved finger  with good tongue extension noted. Infant with decreased mid tongue elevation.infant with high palate. Mom with no pain with feedings, nipples rounded post feeding.  Parents shown tongue and lip restrictions and how they can effect milk supply and milk transfer. Local provider information and website information given. Discussed we can wait to see how infant does as she grows. Will reassess at next feeding.   Infant to follow up with Ped at 272 months of age. [facility] Parents to call with questions/concerns as needed.     General Information: Mother's reason for visit: Feeding assessment, milk supply assessment Consult: Initial Lactation consultant: Jasmine DecemberSharon Ervie Mccard RN,IBCLC Breastfeeding experience: BF 4 x a day, supplementing with a bottle Maternal medical conditions: Pregnancy induced hypertension, Other(+ breast growth with pregnancy)    Breastfeeding History: Frequency of breast feeding: 3-4 x a day Duration of feeding: 5-10 minutes  Supplementation: Supplement method: bottle(Mam Level 1 nipple) Brand: Enfamil Formula volume: 2-6 ounces Formula frequency: 6+ times a day   Breast milk volume: 1 ounce Breast milk frequency: 3-4 x a day   Pump type: Medela pump in style Pump frequency: every 2 hours for 5-10 minutes Pump volume: 1 ounce  Infant Output Assessment: Voids per 24 hours: 8+ Urine color: Clear yellow Stools per 24 hours: 1+ Stool color: Yellow  Breast Assessment: Breast: Soft, Compressible Nipple: Erect Pain level: 0 Pain interventions: Bra, Breast pump  Feeding Assessment: Infant oral assessment: Variance Infant oral assessment comment: see note Positioning: Cross cradle(right breast, 10 minutes) Latch: 1 - Repeated attempts needed to sustain latch, nipple held in mouth throughout feeding, stimulation needed to elicit sucking reflex. Audible swallowing: 2 - Spontaneous and intermittent Type of nipple: 2 -  Everted at rest and after stimulation Comfort: 2 - Soft/non-tender Hold: 1 - Assistance needed to correctly position infant at breast and maintain latch LATCH score: 8 Latch assessment: Deep Lips flanged: Yes Suck assessment: Displays both Tools: Syringe with 5 Fr feeding tube Pre-feed weight: 3332 grams Post feed weight: 3374 grams Amount transferred: 29 ml Amount supplemented: 13 ml  Additional Feeding Assessment: Infant oral assessment: Variance Infant oral assessment comment: see note Positioning: Cross cradle(left breast, 5 minutes) Latch: 1 - Repeated attempts neede to sustain latch, nipple held in mouth throughout feeding, stimulation needed to elicit sucking reflex. Audible swallowing: 1 - A few with stimulation Type of nipple: 2 - Everted at rest and after stimulation Comfort: 2 - Soft/non-tender Hold: 1 - Assistance needed to correctly position infant at breast and maintain latch LATCH score: 7 Latch assessment: Deep Lips flanged: Yes Suck assessment: Displays both Tools: Syringe with 5 Fr feeding tube Pre-feed weight: 3374 grams Post feed weight: 3386 grams Amount transferred: 8 ml Amount supplemented: 4 ml via 5 french feeding tube  Totals: Total amount transferred: 37 ml Total supplement given: 17 ml via 5 frecn feeding tube, 1.5 ounces formula via bottle Total amount pumped post feed: did not pump   Plan:  1. Offer infant the breast with as many feeding as she and mom are able to latch 2. Keep infant awake at the breast as needed 3. Offer her 1 ounce in the bottle prior to BF if she is frustrated at the breast  4. Use the # 5 french feeding tube at the breast with feeding as you wish, can be filled with formula or breast milk 5. Feed infant a bottle of breast milk or formula after breast feeding if she is still cueing to feed 6. Infant needs about 62-83 ml (2-3 ounces) for 8 feedings a day or 495-660 ml (17-22 ounces) in 24 hours. Infant may take more or less  depending on how often she feeds. Feed her until she is satisfied.  7. Feed infant using the paced bottle feeding method (video on [email or web address]) 8. Try the Dr. Saul Fordyce bottles with the Level 1 nipple to see if she has less drooling with the bottle 9. Burp infant at least every ounce during the feeding 10. Continue to pump about every 3 hours, preferably after breast feeding for 15-20 minutes 11. Keep up the good work 60. Thank you for allowing me to assist you today 13. Please call with any questions/concerns as needed [phone] 14. Follow up with Lactation in 2 weeks   [place], IBCLC                                                     Donn Pierini 03/18/2019, 2:40 PM

## 2019-07-01 ENCOUNTER — Other Ambulatory Visit: Payer: Self-pay | Admitting: *Deleted

## 2019-07-01 MED ORDER — PREPLUS 27-1 MG PO TABS: 1.0000 | ORAL_TABLET | Freq: Every day | ORAL | 13 refills | Status: DC

## 2019-07-01 MED ORDER — PRENATAL MULTIVITAMIN CH: 1.0000 | ORAL_TABLET | Freq: Every day | ORAL | 12 refills | Status: DC

## 2019-08-01 ENCOUNTER — Encounter: Payer: Commercial Managed Care - PPO | Admitting: Obstetrics and Gynecology

## 2019-08-22 ENCOUNTER — Ambulatory Visit (INDEPENDENT_AMBULATORY_CARE_PROVIDER_SITE_OTHER): Payer: Medicaid Other | Admitting: Obstetrics and Gynecology

## 2019-08-22 ENCOUNTER — Other Ambulatory Visit (HOSPITAL_COMMUNITY)
Admission: RE | Admit: 2019-08-22 | Discharge: 2019-08-22 | Disposition: A | Discharge status: Home / Self Care | Payer: 59 | Source: Ambulatory Visit | Attending: Obstetrics and Gynecology | Admitting: Obstetrics and Gynecology

## 2019-08-22 ENCOUNTER — Other Ambulatory Visit: Payer: Self-pay

## 2019-08-22 ENCOUNTER — Encounter: Payer: Self-pay | Admitting: Obstetrics and Gynecology

## 2019-08-22 VITALS — BP 123/80 | HR 91 | Wt 227.0 lb

## 2019-08-22 DIAGNOSIS — O09292 Supervision of pregnancy with other poor reproductive or obstetric history, second trimester: Secondary | ICD-10-CM

## 2019-08-22 DIAGNOSIS — Z23 Encounter for immunization: Secondary | ICD-10-CM | POA: Diagnosis not present

## 2019-08-22 DIAGNOSIS — O0990 Supervision of high risk pregnancy, unspecified, unspecified trimester: Secondary | ICD-10-CM | POA: Diagnosis present

## 2019-08-22 DIAGNOSIS — O09899 Supervision of other high risk pregnancies, unspecified trimester: Secondary | ICD-10-CM | POA: Insufficient documentation

## 2019-08-22 DIAGNOSIS — O99210 Obesity complicating pregnancy, unspecified trimester: Secondary | ICD-10-CM

## 2019-08-22 DIAGNOSIS — O26892 Other specified pregnancy related conditions, second trimester: Secondary | ICD-10-CM

## 2019-08-22 DIAGNOSIS — Z6791 Unspecified blood type, Rh negative: Secondary | ICD-10-CM

## 2019-08-22 DIAGNOSIS — O26899 Other specified pregnancy related conditions, unspecified trimester: Secondary | ICD-10-CM

## 2019-08-22 DIAGNOSIS — O0932 Supervision of pregnancy with insufficient antenatal care, second trimester: Secondary | ICD-10-CM

## 2019-08-22 DIAGNOSIS — Z8759 Personal history of other complications of pregnancy, childbirth and the puerperium: Secondary | ICD-10-CM

## 2019-08-22 DIAGNOSIS — Z6841 Body Mass Index (BMI) 40.0 and over, adult: Secondary | ICD-10-CM

## 2019-08-22 DIAGNOSIS — O99212 Obesity complicating pregnancy, second trimester: Secondary | ICD-10-CM

## 2019-08-22 DIAGNOSIS — Z3A18 18 weeks gestation of pregnancy: Secondary | ICD-10-CM

## 2019-08-22 DIAGNOSIS — O09892 Supervision of other high risk pregnancies, second trimester: Secondary | ICD-10-CM

## 2019-08-22 DIAGNOSIS — O0992 Supervision of high risk pregnancy, unspecified, second trimester: Secondary | ICD-10-CM

## 2019-08-22 DIAGNOSIS — O0930 Supervision of pregnancy with insufficient antenatal care, unspecified trimester: Secondary | ICD-10-CM

## 2019-08-22 MED ORDER — ASPIRIN EC 81 MG PO TBEC: 81.0000 mg | DELAYED_RELEASE_TABLET | Freq: Every day | ORAL | 10 refills | Status: DC

## 2019-08-22 NOTE — Progress Notes (Signed)
New OB Note  08/23/2019   Clinic: [facility]  Chief Complaint: NOB  Transfer of Care Patient: no  History of Present Illness: Madison Cook is a 23 y.o. G3P1011 @ 18/0 weeks (EDC 18wk u/s today, based on Patient's last menstrual period was 05/21/2019 (approximate).).  Preg complicated by has BMI 40.0-44.9, adult (HCC); Obesity in pregnancy; Supervision of high risk pregnancy, antepartum; History of gestational hypertension; Short interval between pregnancies affecting pregnancy, antepartum; and Late prenatal care on their problem list.   Any events prior to today's visit: no She was using no method when she conceived.  She has Negative signs or symptoms of nausea/vomiting of pregnancy. She has Negative signs or symptoms of miscarriage or preterm labor On any medications around the time she conceived/early pregnancy: No   ROS: A 12-point review of systems was performed and negative, except as stated in the above HPI.  OBGYN History: As per HPI. OB History  Gravida Para Term Preterm AB Living  3 1 1   1 1   SAB TAB Ectopic Multiple Live Births  1       1    # Outcome Date GA Lbr Len/2nd Weight Sex Delivery Anes PTL Lv  3 Current           2 Term 02/14/19 [redacted]w[redacted]d  6 lb 3.1 oz (2.809 kg)     LIV     Complications: Gestational hypertension  1 SAB 2017            Any issues with any prior pregnancies: no Prior children are healthy, doing well, and without any problems or issues: yes History of pap smears: Yes. Last pap smear ?2018 per patient   Past Medical History: Past Medical History:  Diagnosis Date  . Asthma   . Pregnancy induced hypertension     Past Surgical History: Past Surgical History:  Procedure Laterality Date  . NO PAST SURGERIES    . WISDOM TOOTH EXTRACTION      Family History:  No family history on file.  Social History:  Social History   Socioeconomic History  . Marital status: Single    Spouse name: Not on file  .  Number of children: Not on file  . Years of education: Not on file  . Highest education level: Not on file  Occupational History  . Not on file  Social Needs  . Financial resource strain: Not hard at all  . Food insecurity    Worry: Never true    Inability: Never true  . Transportation needs    Medical: No    Non-medical: Not on file  Tobacco Use  . Smoking status: Never Smoker  . Smokeless tobacco: Never Used  Substance and Sexual Activity  . Alcohol use: No  . Drug use: No  . Sexual activity: Yes    Birth control/protection: None    Comment: last sex Aug 28  Lifestyle  . Physical activity    Days per week: Not on file    Minutes per session: Not on file  . Stress: Not at all  Relationships  . Social Aug 30 on phone: Not on file    Gets together: Not on file    Attends religious service: Not on file    Active member of club or organization: Not on file    Attends meetings of clubs or organizations: Not on file    Relationship status: Not on file  . Intimate partner violence  Fear of current or ex partner: No    Emotionally abused: No    Physically abused: No    Forced sexual activity: No  Other Topics Concern  . Not on file  Social History Narrative  . Not on file    Allergy: No Known Allergies  Current Outpatient Medications: PNV  Physical Exam:   BP 123/80   Pulse 91   Wt 227 lb (103 kg)   LMP 05/21/2019 (Approximate)   BMI 40.21 kg/m  Body mass index is 40.21 kg/m. Contractions: Not present Vag. Bleeding: None. Fundal height: 18 FHTs: 160s  General appearance: Well nourished, well developed female in no acute distress.  Neck:  Supple, normal appearance, and no thyromegaly  Cardiovascular: S1, S2 normal, no murmur, rub or gallop, regular rate and rhythm Respiratory:  Clear to auscultation bilateral. Normal respiratory effort Abdomen: positive bowel sounds and no masses, hernias; diffusely non tender to palpation, non  distended Breasts: patient denies any breast s/s Neuro/Psych:  Normal mood and affect.  Skin:  Warm and dry.  Lymphatic:  No inguinal lymphadenopathy.   Pelvic exam: is not limited by body habitus EGBUS: within normal limits, Vagina: within normal limits and with no blood in the vault, Cervix: normal appearing cervix without discharge or lesions, closed/long/high, Uterus:  enlarged, c/w 18 week size, and Adnexa:  normal adnexa and no mass, fullness, tenderness  Laboratory: none  Imaging:  Bedside u/s with sliup, FHR 160s, subj normal AF, BPD and FL c/w 18/0  Assessment: pt doing well  Plan: 1. History of gestational hypertension Pt amenable to low dose asa - Protein / creatinine ratio, urine - CMP  2. Obesity in pregnancy - TSH - Hemoglobin A1c  3. Short interval between pregnancies affecting pregnancy, antepartum  4. Supervision of high risk pregnancy, antepartum Declined genetics - Obstetric Panel (and HIV) - Culture, OB Urine - Protein / creatinine ratio, urine - CMP - TSH - Hemoglobin A1c - [place] MFM OB DETAIL +14 WK; Future - Cytology - PAP( [facility])  5. Rh neg Rhogam prn  Problem list reviewed and updated.  Follow up in 4 weeks.  >50% of 25 min visit spent on counseling and coordination of care.     Durene Romans MD Attending [facility])

## 2019-08-23 LAB — COMPREHENSIVE METABOLIC PANEL
ALT: 9 IU/L (ref 0–32)
AST: 13 IU/L (ref 0–40)
Albumin/Globulin Ratio: 1.6 (ref 1.2–2.2)
Albumin: 4.1 g/dL (ref 3.9–5.0)
Alkaline Phosphatase: 60 IU/L (ref 39–117)
BUN/Creatinine Ratio: 8 — ABNORMAL LOW (ref 9–23)
BUN: 5 mg/dL — ABNORMAL LOW (ref 6–20)
Bilirubin Total: 0.2 mg/dL (ref 0.0–1.2)
CO2: 20 mmol/L (ref 20–29)
Calcium: 9.8 mg/dL (ref 8.7–10.2)
Chloride: 103 mmol/L (ref 96–106)
Creatinine, Ser: 0.61 mg/dL (ref 0.57–1.00)
GFR calc Af Amer: 149 mL/min/{1.73_m2} (ref >59)
GFR calc non Af Amer: 129 mL/min/{1.73_m2} (ref >59)
Globulin, Total: 2.6 g/dL (ref 1.5–4.5)
Glucose: 89 mg/dL (ref 65–99)
Potassium: 4.4 mmol/L (ref 3.5–5.2)
Sodium: 141 mmol/L (ref 134–144)
Total Protein: 6.7 g/dL (ref 6.0–8.5)

## 2019-08-23 LAB — OBSTETRIC PANEL, INCLUDING HIV
Antibody Screen: NEGATIVE
Basophils Absolute: 0 10*3/uL (ref 0.0–0.2)
Basos: 0 %
EOS (ABSOLUTE): 0.1 10*3/uL (ref 0.0–0.4)
Eos: 0 %
HIV Screen 4th Generation wRfx: NONREACTIVE
Hematocrit: 39.3 % (ref 34.0–46.6)
Hemoglobin: 13 g/dL (ref 11.1–15.9)
Hepatitis B Surface Ag: NEGATIVE
Immature Grans (Abs): 0 10*3/uL (ref 0.0–0.1)
Immature Granulocytes: 0 %
Lymphocytes Absolute: 2.4 10*3/uL (ref 0.7–3.1)
Lymphs: 20 %
MCH: 28.8 pg (ref 26.6–33.0)
MCHC: 33.1 g/dL (ref 31.5–35.7)
MCV: 87 fL (ref 79–97)
Monocytes Absolute: 0.5 10*3/uL (ref 0.1–0.9)
Monocytes: 4 %
Neutrophils Absolute: 9.5 10*3/uL — ABNORMAL HIGH (ref 1.4–7.0)
Neutrophils: 76 %
Platelets: 287 10*3/uL (ref 150–450)
RBC: 4.51 x10E6/uL (ref 3.77–5.28)
RDW: 13.2 % (ref 11.7–15.4)
RPR Ser Ql: NONREACTIVE
Rh Factor: NEGATIVE
Rubella Antibodies, IGG: 3.2 index (ref >0.99)
WBC: 12.5 10*3/uL — ABNORMAL HIGH (ref 3.4–10.8)

## 2019-08-23 LAB — HEMOGLOBIN A1C
Est. average glucose Bld gHb Est-mCnc: 100 mg/dL
Hgb A1c MFr Bld: 5.1 % (ref 4.8–5.6)

## 2019-08-23 LAB — TSH: TSH: 0.632 u[IU]/mL (ref 0.450–4.500)

## 2019-08-23 LAB — PROTEIN / CREATININE RATIO, URINE
Creatinine, Urine: 117.1 mg/dL
Protein, Ur: 12.8 mg/dL
Protein/Creat Ratio: 109 mg/g creat (ref 0–200)

## 2019-08-25 LAB — URINE CULTURE, OB REFLEX: Organism ID, Bacteria: NO GROWTH

## 2019-08-25 LAB — CULTURE, OB URINE

## 2019-08-27 ENCOUNTER — Encounter: Payer: Self-pay | Admitting: Obstetrics and Gynecology

## 2019-08-27 LAB — CYTOLOGY - PAP
Chlamydia: NEGATIVE
Comment: NEGATIVE
Comment: NEGATIVE
Comment: NORMAL
Diagnosis: NEGATIVE
Neisseria Gonorrhea: NEGATIVE
Trichomonas: NEGATIVE

## 2019-08-30 ENCOUNTER — Encounter (HOSPITAL_COMMUNITY): Payer: Self-pay

## 2019-09-04 ENCOUNTER — Ambulatory Visit (HOSPITAL_BASED_OUTPATIENT_CLINIC_OR_DEPARTMENT_OTHER): Payer: 59 | Admitting: Maternal & Fetal Medicine

## 2019-09-04 ENCOUNTER — Other Ambulatory Visit: Payer: Self-pay

## 2019-09-04 ENCOUNTER — Encounter (HOSPITAL_COMMUNITY): Payer: Self-pay

## 2019-09-04 ENCOUNTER — Ambulatory Visit (HOSPITAL_COMMUNITY)
Admission: RE | Admit: 2019-09-04 | Discharge: 2019-09-04 | Disposition: A | Discharge status: Home / Self Care | Payer: 59 | Source: Ambulatory Visit | Attending: Obstetrics and Gynecology | Admitting: Obstetrics and Gynecology

## 2019-09-04 ENCOUNTER — Other Ambulatory Visit (HOSPITAL_COMMUNITY): Payer: Self-pay | Admitting: *Deleted

## 2019-09-04 ENCOUNTER — Ambulatory Visit (HOSPITAL_COMMUNITY): Payer: 59 | Admitting: *Deleted

## 2019-09-04 VITALS — BP 128/87 | HR 113 | Temp 97.5°F

## 2019-09-04 DIAGNOSIS — O09892 Supervision of other high risk pregnancies, second trimester: Secondary | ICD-10-CM | POA: Diagnosis not present

## 2019-09-04 DIAGNOSIS — O09899 Supervision of other high risk pregnancies, unspecified trimester: Secondary | ICD-10-CM | POA: Insufficient documentation

## 2019-09-04 DIAGNOSIS — Z3A20 20 weeks gestation of pregnancy: Secondary | ICD-10-CM | POA: Diagnosis not present

## 2019-09-04 DIAGNOSIS — O09292 Supervision of pregnancy with other poor reproductive or obstetric history, second trimester: Secondary | ICD-10-CM | POA: Diagnosis not present

## 2019-09-04 DIAGNOSIS — O0990 Supervision of high risk pregnancy, unspecified, unspecified trimester: Secondary | ICD-10-CM | POA: Insufficient documentation

## 2019-09-04 DIAGNOSIS — Z3A18 18 weeks gestation of pregnancy: Secondary | ICD-10-CM

## 2019-09-04 DIAGNOSIS — Z362 Encounter for other antenatal screening follow-up: Secondary | ICD-10-CM

## 2019-09-04 DIAGNOSIS — O4402 Placenta previa specified as without hemorrhage, second trimester: Secondary | ICD-10-CM | POA: Diagnosis not present

## 2019-09-04 DIAGNOSIS — O99212 Obesity complicating pregnancy, second trimester: Secondary | ICD-10-CM | POA: Diagnosis not present

## 2019-09-04 DIAGNOSIS — O10919 Unspecified pre-existing hypertension complicating pregnancy, unspecified trimester: Secondary | ICD-10-CM | POA: Diagnosis present

## 2019-09-04 IMAGING — US US MFM OB DETAIL+14 WK
1 series · 13 of 28 positions shown · non-contrast
Comparison: none

[Series 1: us mfm ob detail+14 wk · 13 of 107 slices shown]
[im 4/107]
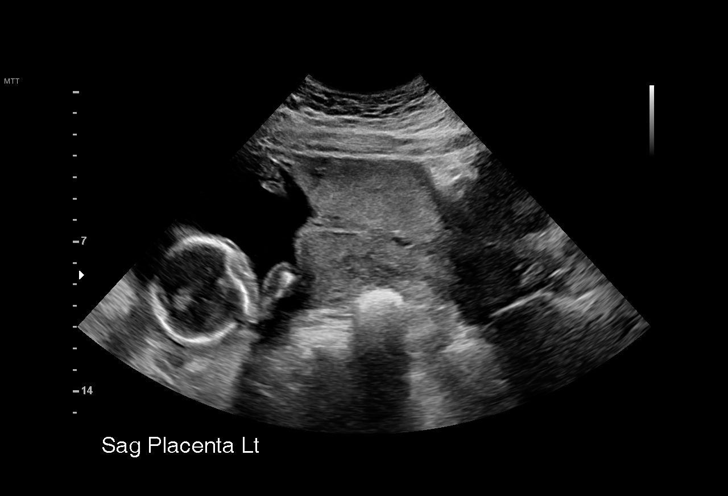
[im 12/107]
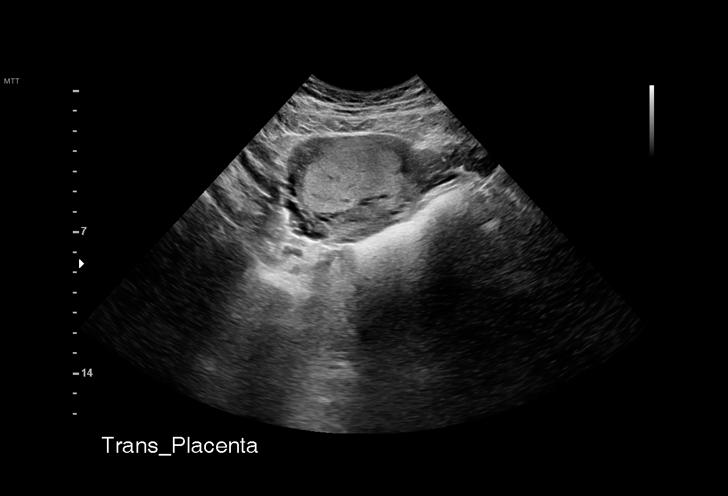
[im 20/107]
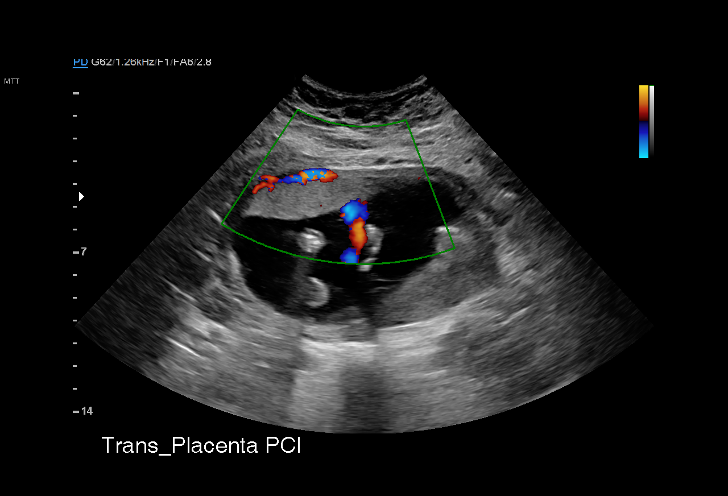
[im 28/107]
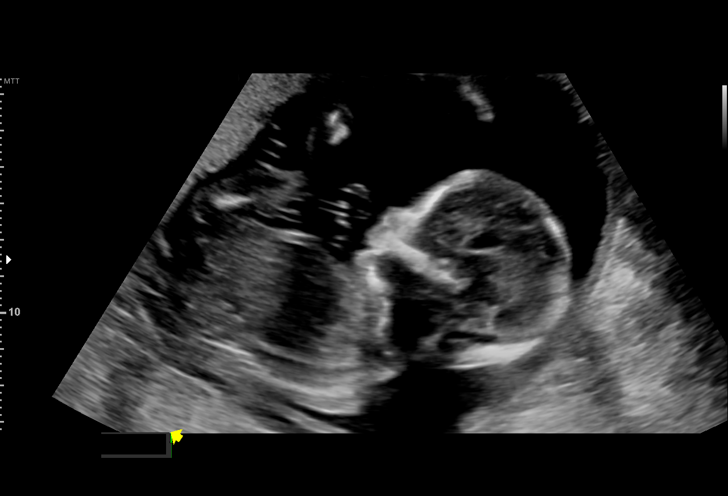
[im 36/107]
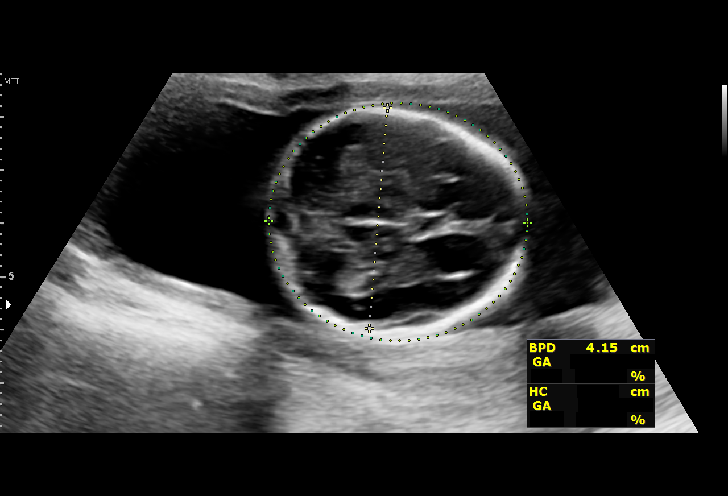
[im 44/107]
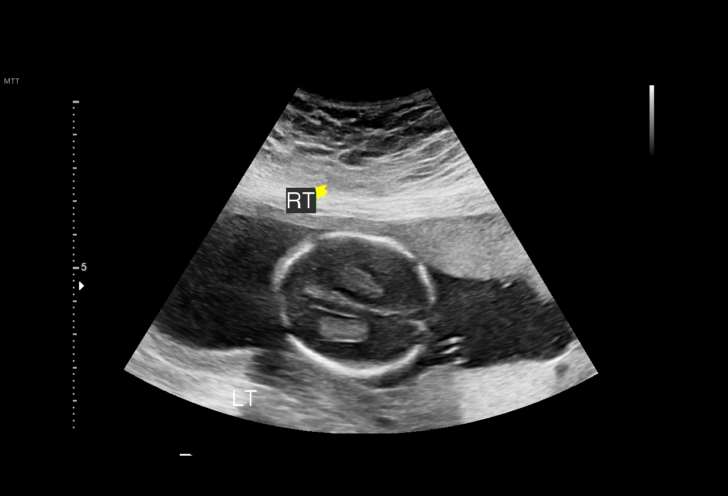
[im 55/107]
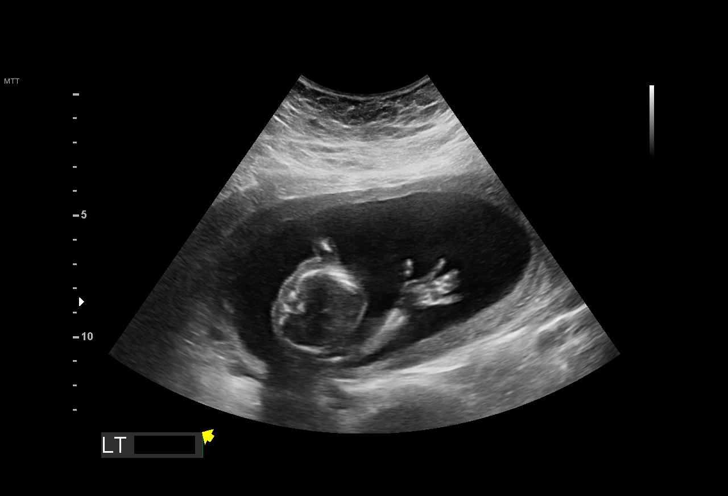
[im 63/107]
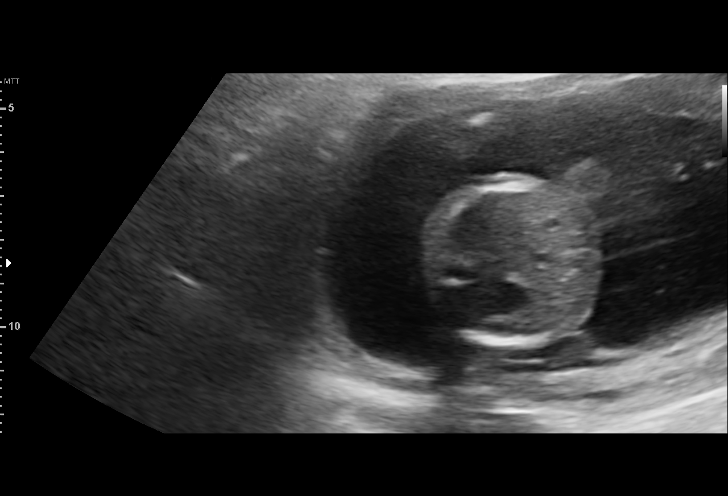
[im 71/107]
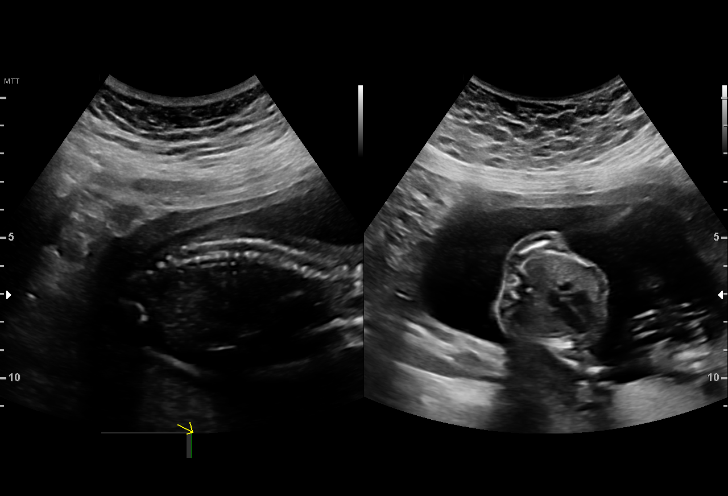
[im 79/107]
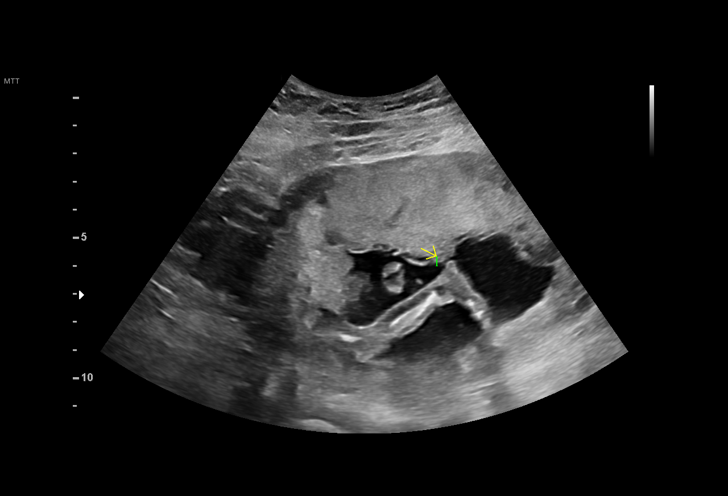
[im 87/107]
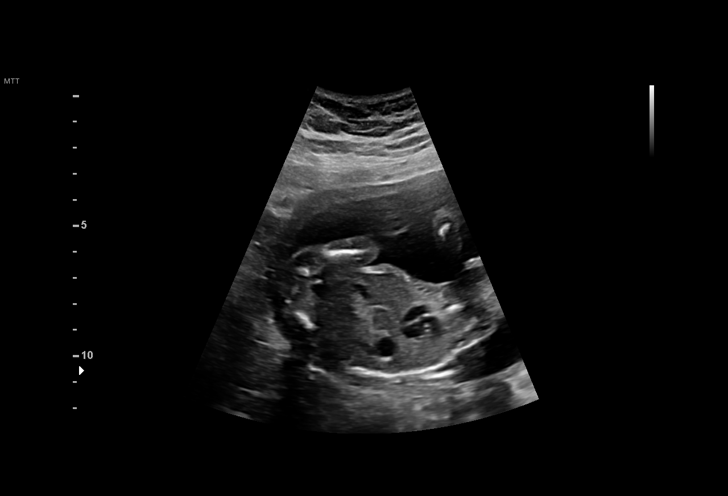
[im 95/107]
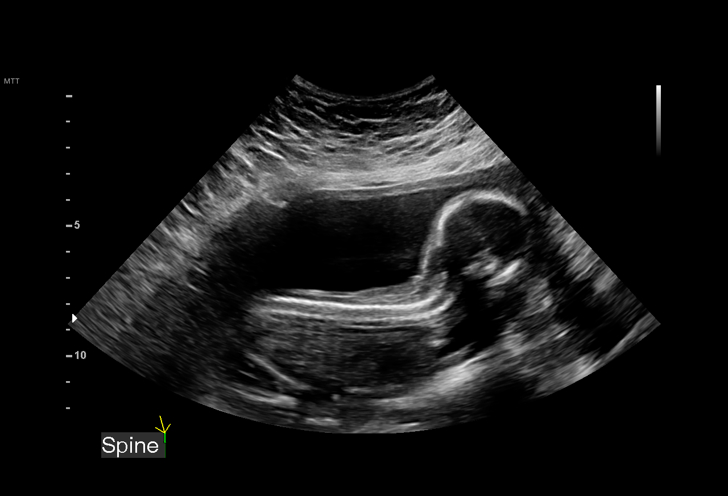
[im 103/107]
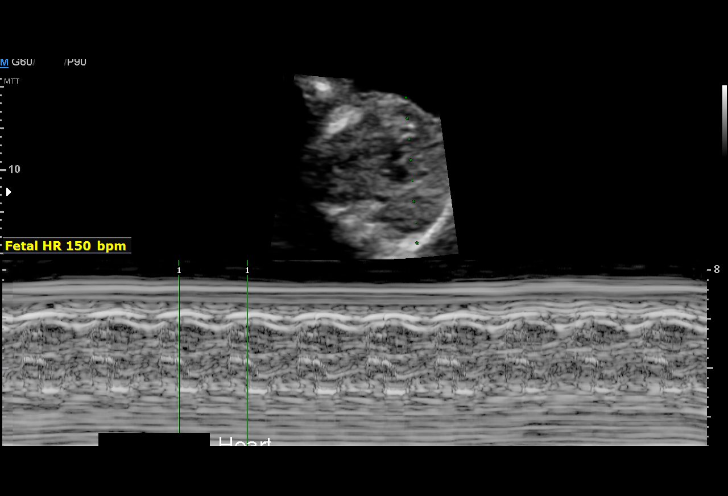

[13 of 28 positions shown; findings below may reference images not displayed]

----------------------------------------------------------------------

 ----------------------------------------------------------------------
Indications

  18 weeks gestation of pregnancy
  Encounter for antenatal screening for          [TC]
  malformations
  Obesity complicating pregnancy, second         [TC]
  trimester
  Short interval between pregancies, 2nd         [TC]
  trimester
  Poor obstetric history: Previous               [TC]
  preeclampsia / eclampsia/gestational HTN
 ----------------------------------------------------------------------
Fetal Evaluation

 Num Of Fetuses:         1
 Fetal Heart Rate(bpm):  150
 Cardiac Activity:       Observed
 Presentation:           Variable
 Placenta:               Central previa
 P. Cord Insertion:      Marginal insertion

 Amniotic Fluid
 AFI FV:      Within normal limits

                             Largest Pocket(cm)

Biometry

 BPD:      42.2  mm     G. Age:  18w 5d         60  %    CI:        80.07   %    70 - 86
                                                         FL/HC:      18.6   %    16.1 -
 HC:       149   mm     G. Age:  18w 0d         17  %    HC/AC:      1.12        1.09 -
 AC:       133   mm     G. Age:  18w 6d         54  %    FL/BPD:     65.6   %
 FL:       27.7  mm     G. Age:  18w 3d         40  %    FL/AC:      20.8   %    20 - 24
 HUM:      27.2  mm     G. Age:  18w 5d         55  %
 CER:      19.3  mm     G. Age:  18w 5d         53  %
 NFT:       2.6  mm
 LV:          8  mm
 CM:        2.9  mm

 Est. FW:     247  gm      0 lb 9 oz     46  %
OB History

 Gravidity:    3         Term:   1        Prem:   0        SAB:   1
 TOP:          0       Ectopic:  0        Living: 1
Gestational Age

 LMP:           15w 1d        Date:  [DATE]                 EDD:   [DATE]
 U/S Today:     18w 4d                                        EDD:   [DATE]
 Best:          18w 4d     Det. By:  U/S ([DATE])           EDD:   [DATE]
Anatomy

 Cranium:               Appears normal         LVOT:                   Not well visualized
 Cavum:                 Appears normal         Aortic Arch:            Not well visualized
 Ventricles:            Appears normal         Ductal Arch:            Not well visualized
 Choroid Plexus:        Appears normal         Diaphragm:              Appears normal
 Cerebellum:            Appears normal         Stomach:                Appears normal, left
                                                                       sided
 Posterior Fossa:       Appears normal         Abdomen:                Appears normal
 Nuchal Fold:           Appears normal         Abdominal Wall:         Appears nml (cord
                                                                       insert, abd wall)
 Face:                  Appears normal         Cord Vessels:           Appears normal (3
                        (orbits and profile)                           vessel cord)
 Lips:                  Appears normal         Kidneys:                Appear normal
 Palate:                Not well visualized    Bladder:                Appears normal
 Thoracic:              Appears normal         Spine:                  Appears normal
 Heart:                 Not well visualized    Upper Extremities:      Appears normal
 RVOT:                  Not well visualized    Lower Extremities:      Appears normal

 Other:  Parents do not wish to know sex of fetus at this time- Female gender.
         Nasal bone visualized. Heels and 5th digit visualized. Technically
         difficult due to maternal habitus and fetal position.
Cervix Uterus Adnexa

 Cervix
 Length:           3.66  cm.
 Normal appearance by transabdominal scan.

 Uterus
 No abnormality visualized.

 Left Ovary
 Within normal limits. No adnexal mass visualized.

 Right Ovary
 Not visualized. No adnexal mass visualized.

 Cul De Sac
 No free fluid seen.

 Adnexa
 No abnormality visualized.
Impression

 Normal interval growth.  No ultrasonic evidence of structural
 fetal anomalies.
 Suboptimal views of the fetal anatomy was obtained
 secondary to fetal position.
 Marginal cord insertion

 Central placental previa obtained. I discussed the finding of a
 placenta previa we discussed reviewed the diagnosis,
 evaluation and management. We discussed that in many
 cases these do not resolve however, we will assess in the
 third trimester. We reviewed the complication of bleeding and
 possible labor. We discussed the increased risk of inpatient
 stay, preterm delivery and emergent cesarean delivery.
 Lastly, I conveyed that if the placenta previa does not resolve
 we recommend delivery between 36-37 weeks. There is no
 evidence of placenta accreta.  Bleeding precaution were
 reviewed.
Recommendations

 Follow up anatomy in 4 weeks
 Repeat growth at 28-32 weeks
 Bleeding precautions provided.
 If placenta previa persist recommend delivery by 36-37 weeks

## 2019-09-04 NOTE — Progress Notes (Signed)
MFM Consult  I met with Ms. Madison Cook today to review her ultrasound a the request of [place].  Normal interval growth.  No ultrasonic evidence of structural fetal anomalies.  Suboptimal views of the fetal anatomy was obtained secondary to fetal position. Marginal cord insertion  Central placental previa obtained. I discussed the finding of a placenta previa we discussed reviewed the diagnosis, evaluation and management. We discussed that in many cases these do not resolve however, we will assess in the third trimester. We reviewed the complication of bleeding and possible labor. We discussed the increased risk of inpatient stay, preterm delivery and emergent cesarean delivery. Lastly, I conveyed that if the placenta previa does not resolve we recommend delivery between 36-37 weeks. There is no evidence of placenta accreta.  Bleeding precaution were reviewed.  Recommendations Follow up anatomy in 4 weeks Repeat growth at 28-32 weeks If placenta previa persist delivery recommended between 36-37 weeks.  Bleeding precautions provided.  I spent 30 minute with > 50% in face to face consultation.  Vikki Ports, MD

## 2019-09-09 ENCOUNTER — Encounter: Payer: Self-pay | Admitting: Obstetrics and Gynecology

## 2019-09-09 DIAGNOSIS — O43199 Other malformation of placenta, unspecified trimester: Secondary | ICD-10-CM | POA: Insufficient documentation

## 2019-09-09 DIAGNOSIS — O4400 Placenta previa specified as without hemorrhage, unspecified trimester: Secondary | ICD-10-CM | POA: Insufficient documentation

## 2019-09-18 ENCOUNTER — Encounter: Payer: Medicaid Other | Admitting: Family Medicine

## 2019-09-25 ENCOUNTER — Other Ambulatory Visit: Payer: Self-pay

## 2019-09-25 ENCOUNTER — Encounter: Payer: Self-pay | Admitting: Certified Nurse Midwife

## 2019-09-25 ENCOUNTER — Telehealth (INDEPENDENT_AMBULATORY_CARE_PROVIDER_SITE_OTHER): Payer: 59 | Admitting: Certified Nurse Midwife

## 2019-09-25 ENCOUNTER — Telehealth: Payer: Medicaid Other | Admitting: Certified Nurse Midwife

## 2019-09-25 DIAGNOSIS — Z8759 Personal history of other complications of pregnancy, childbirth and the puerperium: Secondary | ICD-10-CM

## 2019-09-25 DIAGNOSIS — O4400 Placenta previa specified as without hemorrhage, unspecified trimester: Secondary | ICD-10-CM

## 2019-09-25 DIAGNOSIS — O360920 Maternal care for other rhesus isoimmunization, second trimester, not applicable or unspecified: Secondary | ICD-10-CM

## 2019-09-25 DIAGNOSIS — O0992 Supervision of high risk pregnancy, unspecified, second trimester: Secondary | ICD-10-CM

## 2019-09-25 DIAGNOSIS — O26892 Other specified pregnancy related conditions, second trimester: Secondary | ICD-10-CM

## 2019-09-25 DIAGNOSIS — U071 COVID-19: Secondary | ICD-10-CM | POA: Insufficient documentation

## 2019-09-25 DIAGNOSIS — O98512 Other viral diseases complicating pregnancy, second trimester: Secondary | ICD-10-CM

## 2019-09-25 DIAGNOSIS — O4402 Placenta previa specified as without hemorrhage, second trimester: Secondary | ICD-10-CM

## 2019-09-25 DIAGNOSIS — Z6791 Unspecified blood type, Rh negative: Secondary | ICD-10-CM

## 2019-09-25 DIAGNOSIS — O0990 Supervision of high risk pregnancy, unspecified, unspecified trimester: Secondary | ICD-10-CM

## 2019-09-25 DIAGNOSIS — O26899 Other specified pregnancy related conditions, unspecified trimester: Secondary | ICD-10-CM

## 2019-09-25 MED ORDER — BENZONATATE 100 MG PO CAPS: 200.0000 mg | ORAL_CAPSULE | Freq: Three times a day (TID) | ORAL | 0 refills | Status: DC | PRN

## 2019-09-25 NOTE — Progress Notes (Signed)
TELEHEALTH OBSTETRICS PRENATAL VIRTUAL VIDEO VISIT ENCOUNTER NOTE  Provider location: [facility]   I connected with Madison Cook on 09/25/19 at  9:04 AM EST by MyChart Video Encounter at home and verified that I am speaking with the correct person using two identifiers.   I discussed the limitations, risks, security and privacy concerns of performing an evaluation and management service virtually and the availability of in person appointments. I also discussed with the patient that there may be a patient responsible charge related to this service. The patient expressed understanding and agreed to proceed. Subjective:  Madison Cook is a 23 y.o. G3P1011 at [redacted]w[redacted]d being seen today for ongoing prenatal care.  She is currently monitored for the following issues for this high-risk pregnancy and has BMI 40.0-44.9, adult (HCC); Obesity in pregnancy; Supervision of high risk pregnancy, antepartum; Rh negative state in antepartum period; History of gestational hypertension; Short interval between pregnancies affecting pregnancy, antepartum; Late prenatal care; Placenta previa; Marginal insertion of umbilical cord affecting management of mother; and COVID-19 affecting pregnancy in second trimester on their problem list.  Patient reports sore throat, cough, HA.  Contractions: Not present. Vag. Bleeding: None.  Movement: Present. Denies any leaking of fluid.   The following portions of the patient's history were reviewed and updated as appropriate: allergies, current medications, past family history, past medical history, past social history, past surgical history and problem list.   Objective:  There were no vitals filed for this visit.  Fetal Status:     Movement: Present     General:  Alert, oriented and cooperative. Patient is in no acute distress.  Respiratory: Normal respiratory effort, no problems with respiration noted  Mental Status: Normal mood and  affect. Normal behavior. Normal judgment and thought content.  Rest of physical exam deferred due to type of encounter  Imaging: [place] MFM OB DETAIL +14 WK  Result Date: 09/04/2019 ----------------------------------------------------------------------  OBSTETRICS REPORT                       (Signed Final 09/04/2019 03:22 pm) ---------------------------------------------------------------------- Patient Info  ID #:       [id number]                          D.O.B.:  June 25, 1996 (23 yrs)  Name:       Madison Cook                Visit Date: 09/04/2019 01:50 pm ---------------------------------------------------------------------- Performed By  Performed By:     Tommie Raymond BS,       Ref. Address:     [address]                    [address]                                                             [address]  Attending:        Lin Landsman      [facility]  Referred By:      [facility] Mila Merry ---------------------------------------------------------------------- Orders   #  Description                          Code         Ordered By   1  [place] MFM OB DETAIL +14 WK              [id number]     [place] Bing  ----------------------------------------------------------------------   #  Order #                    Accession #                 Episode #   1  [id number]                  [id number]                  [id number]  ---------------------------------------------------------------------- Indications   [redacted] weeks gestation of pregnancy                Z3A.18   Encounter for antenatal screening for          Z36.3   malformations   Obesity complicating pregnancy, second         O99.212   trimester   Short interval between pregancies, 2nd         O09.892   trimester   Poor obstetric history: Previous               O09.299   preeclampsia / eclampsia/gestational HTN   ---------------------------------------------------------------------- Fetal Evaluation  Num Of Fetuses:         1  Fetal Heart Rate(bpm):  150  Cardiac Activity:       Observed  Presentation:           Variable  Placenta:               Central previa  P. Cord Insertion:      Marginal insertion  Amniotic Fluid  AFI FV:      Within normal limits                              Largest Pocket(cm)                              6.5 ---------------------------------------------------------------------- Biometry  BPD:      42.2  mm     G. Age:  18w 5d         60  %    CI:        80.07   %    70 - 86                                                          FL/HC:      18.6   %    16.1 - 18.3  HC:       149   mm     G. Age:  18w 0d         17  %    HC/AC:  1.12        1.09 - 1.39  AC:       133   mm     G. Age:  18w 6d         54  %    FL/BPD:     65.6   %  FL:       27.7  mm     G. Age:  18w 3d         40  %    FL/AC:      20.8   %    20 - 24  HUM:      27.2  mm     G. Age:  18w 5d         55  %  CER:      19.3  mm     G. Age:  18w 5d         53  %  NFT:       2.6  mm  LV:          8  mm  CM:        2.9  mm  Est. FW:     247  gm      0 lb 9 oz     46  % ---------------------------------------------------------------------- OB History  Gravidity:    3         Term:   1        Prem:   0        SAB:   1  TOP:          0       Ectopic:  0        Living: 1 ---------------------------------------------------------------------- Gestational Age  LMP:           15w 1d        Date:  05/21/19                 EDD:   02/25/20  U/S Today:     18w 4d                                        EDD:   02/01/20  Best:          18w 4d     Det. By:  U/S (09/04/19)           EDD:   02/01/20 ---------------------------------------------------------------------- Anatomy  Cranium:               Appears normal         LVOT:                   Not well visualized  Cavum:                 Appears normal         Aortic Arch:            Not well visualized   Ventricles:            Appears normal         Ductal Arch:            Not well visualized  Choroid Plexus:        Appears normal         Diaphragm:  Appears normal  Cerebellum:            Appears normal         Stomach:                Appears normal, left                                                                        sided  Posterior Fossa:       Appears normal         Abdomen:                Appears normal  Nuchal Fold:           Appears normal         Abdominal Wall:         Appears nml (cord                                                                        insert, abd wall)  Face:                  Appears normal         Cord Vessels:           Appears normal (3                         (orbits and profile)                           vessel cord)  Lips:                  Appears normal         Kidneys:                Appear normal  Palate:                Not well visualized    Bladder:                Appears normal  Thoracic:              Appears normal         Spine:                  Appears normal  Heart:                 Not well visualized    Upper Extremities:      Appears normal  RVOT:                  Not well visualized    Lower Extremities:      Appears normal  Other:  Parents do not wish to know sex of fetus at this time- Female gender.          Nasal bone visualized. Heels and 5th digit visualized. Technically  difficult due to maternal habitus and fetal position. ---------------------------------------------------------------------- Cervix Uterus Adnexa  Cervix  Length:           3.66  cm.  Normal appearance by transabdominal scan.  Uterus  No abnormality visualized.  Left Ovary  Within normal limits. No adnexal mass visualized.  Right Ovary  Not visualized. No adnexal mass visualized.  Cul De Sac  No free fluid seen.  Adnexa  No abnormality visualized. ---------------------------------------------------------------------- Impression  Normal interval growth.  No ultrasonic  evidence of structural  fetal anomalies.  Suboptimal views of the fetal anatomy was obtained  secondary to fetal position.  Marginal cord insertion  Central placental previa obtained. I discussed the finding of a  placenta previa we discussed reviewed the diagnosis,  evaluation and management. We discussed that in many  cases these do not resolve however, we will assess in the  third trimester. We reviewed the complication of bleeding and  possible labor. We discussed the increased risk of inpatient  stay, preterm delivery and emergent cesarean delivery.  Lastly, I conveyed that if the placenta previa does not resolve  we recommend delivery between 36-37 weeks. There is no  evidence of placenta accreta.  Bleeding precaution were  reviewed. ---------------------------------------------------------------------- Recommendations  Follow up anatomy in 4 weeks  Repeat growth at 28-32 weeks  Bleeding precautions provided.  If placenta previa persist recommend delivery by 36-37 weeks ----------------------------------------------------------------------               Sander Nephew, MD Electronically Signed Final Report   09/04/2019 03:22 pm ----------------------------------------------------------------------   Assessment and Plan:  Pregnancy: [id number] at [redacted]w[redacted]d 1. Supervision of high risk pregnancy, antepartum - Patient doing well with pregnancy, no OB complaints - patient recently diagnosed with COVID - Anticipatory guidance on upcoming appointments with GTT at next appointment  - Reviewed with patient that [place] scheduled for next week needs to be pushed back d/t COVID, patient verbalizes understanding.   2. Rh negative state in antepartum period - Needs rhogam around 28 weeks   3. History of gestational hypertension - Educated and discussed use of [employer] app to check BP.  - Patient reports never receiving email from [employer] - order placed and instructed patient to call office if she has not  received email by Thursday.   4. Placenta previa antepartum - Patient diagnosed with previa at anatomy US appointment  - Has follow up US scheduled for 12/30 - will be changed and pushed back d/t +COVID diagnoses- patient verbalizes understanding.   5. COVID-19 affecting pregnancy in second trimester - Patient reports testing positive on 12/22 by rapid test at Urgent care  - Reports waking up yesterday morning with HA, sore throat and not feeling well  - Has been taking Tylenol for HA with relief  - Discussed safe medications during pregnancy  - Encouraged to stay away from family in house as much as possible and to self quarantine - patient verbalizes understanding  - Rx for tessalon perles sent to pharmacy of choice, instructed for family that does not live in same household to go get medication and drop off at front door  - benzonatate (TESSALON PERLES) 100 MG capsule; Take 2 capsules (200 mg total) by mouth 3 (three) times daily as needed for cough.  Dispense: 30 capsule; Refill: 0   Preterm labor symptoms and general obstetric precautions including but not limited to vaginal bleeding, contractions, leaking of fluid and fetal movement were reviewed in detail with the patient. I discussed the  assessment and treatment plan with the patient. The patient was provided an opportunity to ask questions and all were answered. The patient agreed with the plan and demonstrated an understanding of the instructions. The patient was advised to call back or seek an in-person office evaluation/go to MAU at [facility] for any urgent or concerning symptoms. Please refer to After Visit Summary for other counseling recommendations.   I provided 10 minutes of face-to-face time during this encounter.  Return in about 4 weeks (around 10/23/2019) for ROB/GTT.  Future Appointments  Date Time Provider Department Center  10/02/2019  1:45 PM WH-MFC [place] 2 WH-MFCUS MFC-US  10/02/2019  1:50 PM WH-MFC  NURSE WH-MFC MFC-US  10/23/2019  1:45 PM Calvert Cantor, CNM [facility]    Sharyon Cable, CNM [facility] [employer], [facility]

## 2019-09-25 NOTE — Progress Notes (Signed)
BP taken at Urgent care 137/73 I connected with  Danella Deis on 09/25/19 at  8:45 AM EST by telephone and verified that I am speaking with the correct person using two identifiers.   I discussed the limitations, risks, security and privacy concerns of performing an evaluation and management service by telephone and the availability of in person appointments. I also discussed with the patient that there may be a patient responsible charge related to this service. The patient expressed understanding and agreed to proceed.  Crosby Oyster, RN 09/25/2019  9:03 AM

## 2019-09-25 NOTE — Patient Instructions (Signed)

## 2019-10-02 ENCOUNTER — Ambulatory Visit (HOSPITAL_COMMUNITY): Payer: 59

## 2019-10-11 ENCOUNTER — Ambulatory Visit (HOSPITAL_COMMUNITY): Admission: RE | Admit: 2019-10-11 | Payer: 59 | Source: Ambulatory Visit

## 2019-10-11 ENCOUNTER — Ambulatory Visit (HOSPITAL_COMMUNITY): Payer: Medicaid Other

## 2019-10-13 ENCOUNTER — Other Ambulatory Visit: Payer: Self-pay

## 2019-10-13 ENCOUNTER — Inpatient Hospital Stay (HOSPITAL_COMMUNITY)
Admission: AD | Admit: 2019-10-13 | Discharge: 2019-10-21 | DRG: 833 | Disposition: A | Discharge status: Home / Self Care | Payer: Commercial Managed Care - PPO | Attending: Obstetrics and Gynecology | Admitting: Obstetrics and Gynecology

## 2019-10-13 ENCOUNTER — Encounter (HOSPITAL_COMMUNITY): Payer: Self-pay | Admitting: Obstetrics and Gynecology

## 2019-10-13 DIAGNOSIS — Z3A24 24 weeks gestation of pregnancy: Secondary | ICD-10-CM | POA: Diagnosis not present

## 2019-10-13 DIAGNOSIS — Z3A26 26 weeks gestation of pregnancy: Secondary | ICD-10-CM | POA: Diagnosis not present

## 2019-10-13 DIAGNOSIS — E669 Obesity, unspecified: Secondary | ICD-10-CM | POA: Diagnosis present

## 2019-10-13 DIAGNOSIS — Z362 Encounter for other antenatal screening follow-up: Secondary | ICD-10-CM | POA: Diagnosis not present

## 2019-10-13 DIAGNOSIS — O132 Gestational [pregnancy-induced] hypertension without significant proteinuria, second trimester: Secondary | ICD-10-CM | POA: Diagnosis present

## 2019-10-13 DIAGNOSIS — O99212 Obesity complicating pregnancy, second trimester: Secondary | ICD-10-CM | POA: Diagnosis present

## 2019-10-13 DIAGNOSIS — O26893 Other specified pregnancy related conditions, third trimester: Secondary | ICD-10-CM | POA: Diagnosis present

## 2019-10-13 DIAGNOSIS — Z6791 Unspecified blood type, Rh negative: Secondary | ICD-10-CM | POA: Diagnosis not present

## 2019-10-13 DIAGNOSIS — Z3A25 25 weeks gestation of pregnancy: Secondary | ICD-10-CM

## 2019-10-13 DIAGNOSIS — O43122 Velamentous insertion of umbilical cord, second trimester: Secondary | ICD-10-CM | POA: Diagnosis present

## 2019-10-13 DIAGNOSIS — O360120 Maternal care for anti-D [Rh] antibodies, second trimester, not applicable or unspecified: Secondary | ICD-10-CM | POA: Diagnosis not present

## 2019-10-13 DIAGNOSIS — Z8616 Personal history of COVID-19: Secondary | ICD-10-CM | POA: Diagnosis present

## 2019-10-13 DIAGNOSIS — O4692 Antepartum hemorrhage, unspecified, second trimester: Secondary | ICD-10-CM | POA: Diagnosis not present

## 2019-10-13 DIAGNOSIS — O4412 Placenta previa with hemorrhage, second trimester: Secondary | ICD-10-CM | POA: Diagnosis not present

## 2019-10-13 DIAGNOSIS — O4400 Placenta previa specified as without hemorrhage, unspecified trimester: Secondary | ICD-10-CM

## 2019-10-13 DIAGNOSIS — O09892 Supervision of other high risk pregnancies, second trimester: Secondary | ICD-10-CM | POA: Diagnosis not present

## 2019-10-13 DIAGNOSIS — O139 Gestational [pregnancy-induced] hypertension without significant proteinuria, unspecified trimester: Secondary | ICD-10-CM | POA: Diagnosis present

## 2019-10-13 DIAGNOSIS — O4402 Placenta previa specified as without hemorrhage, second trimester: Secondary | ICD-10-CM

## 2019-10-13 LAB — CBC
HCT: 36.6 % (ref 36.0–46.0)
Hemoglobin: 11.8 g/dL — ABNORMAL LOW (ref 12.0–15.0)
MCH: 29.5 pg (ref 26.0–34.0)
MCHC: 32.2 g/dL (ref 30.0–36.0)
MCV: 91.5 fL (ref 80.0–100.0)
Platelets: 242 10*3/uL (ref 150–400)
RBC: 4 MIL/uL (ref 3.87–5.11)
RDW: 13.8 % (ref 11.5–15.5)
WBC: 17.8 10*3/uL — ABNORMAL HIGH (ref 4.0–10.5)
nRBC: 0 % (ref 0.0–0.2)

## 2019-10-13 LAB — COMPREHENSIVE METABOLIC PANEL
ALT: 11 U/L (ref 0–44)
AST: 12 U/L — ABNORMAL LOW (ref 15–41)
Albumin: 2.7 g/dL — ABNORMAL LOW (ref 3.5–5.0)
Alkaline Phosphatase: 48 U/L (ref 38–126)
Anion gap: 9 (ref 5–15)
BUN: 8 mg/dL (ref 6–20)
CO2: 22 mmol/L (ref 22–32)
Calcium: 8.8 mg/dL — ABNORMAL LOW (ref 8.9–10.3)
Chloride: 105 mmol/L (ref 98–111)
Creatinine, Ser: 0.53 mg/dL (ref 0.44–1.00)
GFR calc Af Amer: >60 mL/min (ref >60)
GFR calc non Af Amer: >60 mL/min (ref >60)
Glucose, Bld: 93 mg/dL (ref 70–99)
Potassium: 3.7 mmol/L (ref 3.5–5.1)
Sodium: 136 mmol/L (ref 135–145)
Total Bilirubin: 0.5 mg/dL (ref 0.3–1.2)
Total Protein: 6.2 g/dL — ABNORMAL LOW (ref 6.5–8.1)

## 2019-10-13 MED ORDER — LACTATED RINGERS IV SOLN: INTRAVENOUS | Status: DC

## 2019-10-13 MED ORDER — DOCUSATE SODIUM 100 MG PO CAPS
100.0000 mg | ORAL_CAPSULE | Freq: Every day | ORAL | Status: DC
  Administered 2019-10-13: 17:00:00 100 mg via ORAL
  Filled 2019-10-13: qty 1

## 2019-10-13 MED ORDER — ACETAMINOPHEN 325 MG PO TABS
650.0000 mg | ORAL_TABLET | ORAL | Status: DC | PRN
  Administered 2019-10-14 – 2019-10-20 (×4): 650 mg via ORAL
  Filled 2019-10-13 (×4): qty 2

## 2019-10-13 MED ORDER — PRENATAL MULTIVITAMIN CH
1.0000 | ORAL_TABLET | Freq: Every day | ORAL | Status: DC
  Administered 2019-10-13 – 2019-10-21 (×9): 1 via ORAL
  Filled 2019-10-13 (×9): qty 1

## 2019-10-13 MED ORDER — CALCIUM CARBONATE ANTACID 500 MG PO CHEW
2.0000 | CHEWABLE_TABLET | ORAL | Status: DC | PRN
  Filled 2019-10-13: qty 2

## 2019-10-13 MED ORDER — BETAMETHASONE SOD PHOS & ACET 6 (3-3) MG/ML IJ SUSP
12.0000 mg | INTRAMUSCULAR | Status: AC
  Administered 2019-10-13 – 2019-10-14 (×2): 12 mg via INTRAMUSCULAR
  Filled 2019-10-13 (×2): qty 5

## 2019-10-13 MED ORDER — ZOLPIDEM TARTRATE 5 MG PO TABS
5.0000 mg | ORAL_TABLET | Freq: Every evening | ORAL | Status: DC | PRN
  Administered 2019-10-14 – 2019-10-16 (×3): 5 mg via ORAL
  Filled 2019-10-13 (×3): qty 1

## 2019-10-13 NOTE — H&P (Addendum)
OBSTETRIC ADMISSION HISTORY AND PHYSICAL  Madison Cook is a 24 y.o. female G3P1011 with IUP at [redacted]w[redacted]d by LMP presenting for bright red vaginal bleeding. She has a known central previa and had intercourse this morning. She denies any pain.   She reports +FMs, No LOF, no VB, no blurry vision, headaches or peripheral edema, and RUQ pain.     Dating: By LMP --->  Estimated Date of Delivery: 01/23/20   Prenatal History/Complications:  Past Medical History: Past Medical History:  Diagnosis Date   Asthma    Pregnancy induced hypertension     Past Surgical History: Past Surgical History:  Procedure Laterality Date   WISDOM TOOTH EXTRACTION      Obstetrical History: OB History     Gravida  3   Para  1   Term  1   Preterm      AB  1   Living  1      SAB  1   TAB      Ectopic      Multiple      Live Births  1           Social History Social History   Socioeconomic History   Marital status: Single    Spouse name: Not on file   Number of children: Not on file   Years of education: Not on file   Highest education level: Not on file  Occupational History   Not on file  Tobacco Use   Smoking status: Never Smoker   Smokeless tobacco: Never Used  Substance and Sexual Activity   Alcohol use: No   Drug use: No   Sexual activity: Yes    Birth control/protection: None    Comment: last sex Aug 28  Other Topics Concern   Not on file  Social History Narrative   Not on file   Social Determinants of Health   Financial Resource Strain: Low Risk    Difficulty of Paying Living Expenses: Not hard at all  Food Insecurity: No Food Insecurity   Worried About Programme researcher, broadcasting/film/video in the Last Year: Never true   Ran Out of Food in the Last Year: Never true  Transportation Needs: Unknown   Lack of Transportation (Medical): No   Lack of Transportation (Non-Medical): Not on file  Physical Activity:    Days of Exercise per Week: Not on file   Minutes of Exercise  per Session: Not on file  Stress: No Stress Concern Present   Feeling of Stress : Not at all  Social Connections:    Frequency of Communication with Friends and Family: Not on file   Frequency of Social Gatherings with Friends and Family: Not on file   Attends Religious Services: Not on file   Active Member of Clubs or Organizations: Not on file   Attends Banker Meetings: Not on file   Marital Status: Not on file    Family History: History reviewed. No pertinent family history.  Allergies: No Known Allergies  Medications Prior to Admission  Medication Sig Dispense Refill Last Dose   albuterol (VENTOLIN HFA) 108 (90 Base) MCG/ACT inhaler Inhale 2 puffs into the lungs every 6 (six) hours as needed for wheezing or shortness of breath.      aspirin EC 81 MG tablet Take 1 tablet (81 mg total) by mouth daily. (Patient not taking: Reported on 09/04/2019) 30 tablet 10    benzonatate (TESSALON PERLES) 100 MG capsule Take 2 capsules (200 mg  total) by mouth 3 (three) times daily as needed for cough. 30 capsule 0    Prenatal Vit-Fe Fumarate-FA (PREPLUS) 27-1 MG TABS Take 1 tablet by mouth daily. 30 tablet 13      Review of Systems   All systems reviewed and negative except as stated in HPI  Blood pressure 123/83, pulse (!) 117, temperature 98.4 F (36.9 C), temperature source Oral, resp. rate 18, last menstrual period 05/21/2019, SpO2 97 %, unknown if currently breastfeeding. General appearance: alert, cooperative and mild distress Lungs: clear to auscultation bilaterally Heart: regular rate and rhythm Abdomen: soft, non-tender; bowel sounds normal Pelvic: Small amount of bright red bleeding in vaginal vault, small amount of bright red bleeding from os, visually closed. Extremities: Homans sign is negative, no sign of DVT DTR's +2 Fetal monitoringBaseline: 150 bpm, Variability: Good {> 6 bpm), Accelerations: Non-reactive but appropriate for gestational age and Decelerations:  Absent Uterine activityNone     Prenatal labs: ABO, Rh: A/Negative/-- (11/19 1511) Antibody: Negative (11/19 1511) Rubella: 3.20 (11/19 1511) RPR: Non Reactive (11/19 1511)  HBsAg: Negative (11/19 1511)  HIV: Non Reactive (11/19 1511)  GBS: --Tessie Fass (05/07 1158)  No results found for this or any previous visit (from the past 24 hour(s)).  Patient Active Problem List   Diagnosis Date Noted   COVID-19 affecting pregnancy in second trimester 09/25/2019   Placenta previa 09/09/2019   Marginal insertion of umbilical cord affecting management of mother 09/09/2019   History of gestational hypertension 08/22/2019   Short interval between pregnancies affecting pregnancy, antepartum 08/22/2019   Late prenatal care 08/22/2019   BMI 40.0-44.9, adult ([place]) 12/17/2018   Obesity in pregnancy 12/17/2018   Supervision of high risk pregnancy, antepartum 12/17/2018   Rh negative state in antepartum period 12/17/2018    Assessment/Plan:  Madison Cook is a 24 y.o. G3P1011 at [redacted]w[redacted]d here for first previa bleed.  -Dr. Rip Harbour notified of patient arrival and complaint. -MD to place orders and assume care of patient.   Wende Mott, CNM  10/13/2019, 3:54 PM  OB Attending Pt seen and examined Known placenta previa. First episode of bleeding after IC. Admit for 5-7 days. BMZ for fetal lung maturity. POC reviewed with pt. Pt agreed  Arlina Robes, MD

## 2019-10-13 NOTE — MAU Note (Signed)
Pt reports to mau with c/o bright red bleeding for the past hour.  Pt reports sex this morning before bleeding started.  Pt reports good fetal movement denies LOF.  Complains of mild abd cramping that she rates 2/10.

## 2019-10-14 ENCOUNTER — Inpatient Hospital Stay (HOSPITAL_COMMUNITY): Payer: Commercial Managed Care - PPO

## 2019-10-14 DIAGNOSIS — O360120 Maternal care for anti-D [Rh] antibodies, second trimester, not applicable or unspecified: Secondary | ICD-10-CM

## 2019-10-14 DIAGNOSIS — O09892 Supervision of other high risk pregnancies, second trimester: Secondary | ICD-10-CM

## 2019-10-14 DIAGNOSIS — Z3A24 24 weeks gestation of pregnancy: Secondary | ICD-10-CM

## 2019-10-14 DIAGNOSIS — Z3A25 25 weeks gestation of pregnancy: Secondary | ICD-10-CM

## 2019-10-14 DIAGNOSIS — O4402 Placenta previa specified as without hemorrhage, second trimester: Secondary | ICD-10-CM

## 2019-10-14 DIAGNOSIS — O139 Gestational [pregnancy-induced] hypertension without significant proteinuria, unspecified trimester: Secondary | ICD-10-CM | POA: Diagnosis present

## 2019-10-14 DIAGNOSIS — Z362 Encounter for other antenatal screening follow-up: Secondary | ICD-10-CM

## 2019-10-14 DIAGNOSIS — O99212 Obesity complicating pregnancy, second trimester: Secondary | ICD-10-CM

## 2019-10-14 HISTORY — DX: Gestational (pregnancy-induced) hypertension without significant proteinuria, unspecified trimester: O13.9

## 2019-10-14 LAB — PROTEIN / CREATININE RATIO, URINE
Creatinine, Urine: 32.03 mg/dL
Protein Creatinine Ratio: 0.28 mg/mg{Cre} — ABNORMAL HIGH (ref 0.00–0.15)
Total Protein, Urine: 9 mg/dL

## 2019-10-14 LAB — URINALYSIS, ROUTINE W REFLEX MICROSCOPIC
Bacteria, UA: NONE SEEN
Bilirubin Urine: NEGATIVE
Glucose, UA: NEGATIVE mg/dL
Ketones, ur: NEGATIVE mg/dL
Leukocytes,Ua: NEGATIVE
Nitrite: NEGATIVE
Protein, ur: NEGATIVE mg/dL
RBC / HPF: >50 RBC/hpf — ABNORMAL HIGH (ref 0–5)
Specific Gravity, Urine: 1.005 (ref 1.005–1.030)
pH: 8 (ref 5.0–8.0)

## 2019-10-14 LAB — KLEIHAUER-BETKE STAIN
# Vials RhIg: 1
Fetal Cells %: 0 %
Quantitation Fetal Hemoglobin: 0 mL

## 2019-10-14 LAB — TYPE AND SCREEN
ABO/RH(D): A NEG
Antibody Screen: NEGATIVE

## 2019-10-14 IMAGING — US US MFM OB FOLLOW-UP
1 series · 13 of 28 positions shown · non-contrast
Comparison: none

[Series 1: us mfm ob follow-up · 67 acquisitions, 13 frames shown]
[im 3/67]
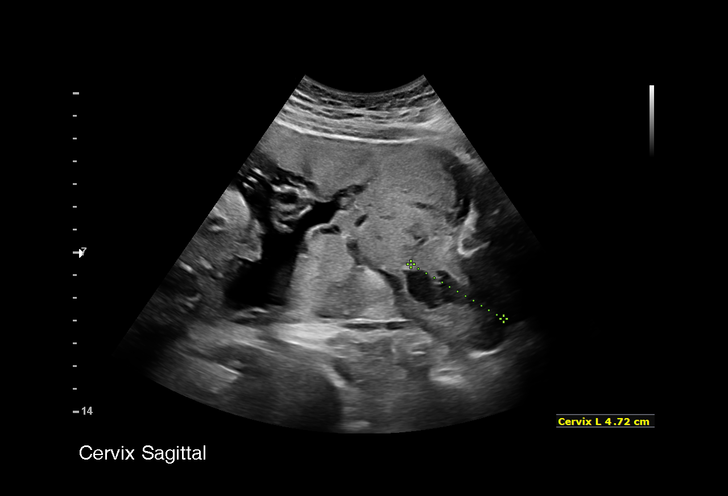
[im 8/67]
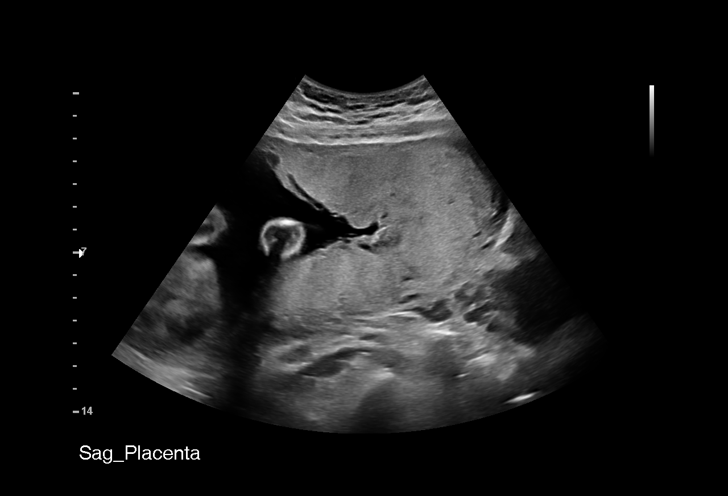
[im 13/67]
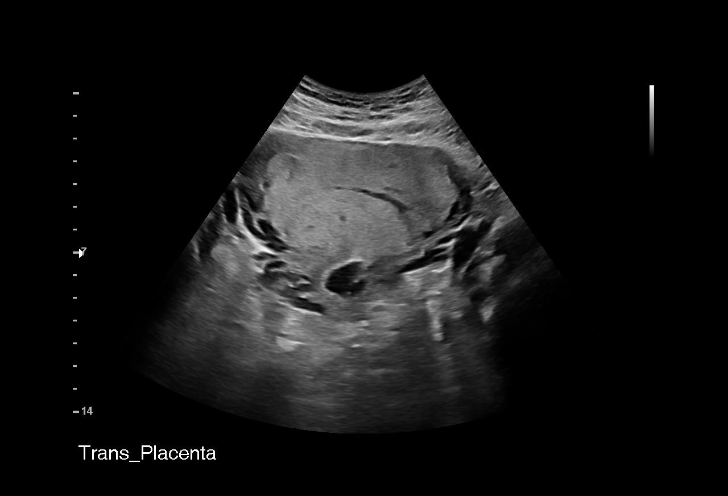
[im 18/67]
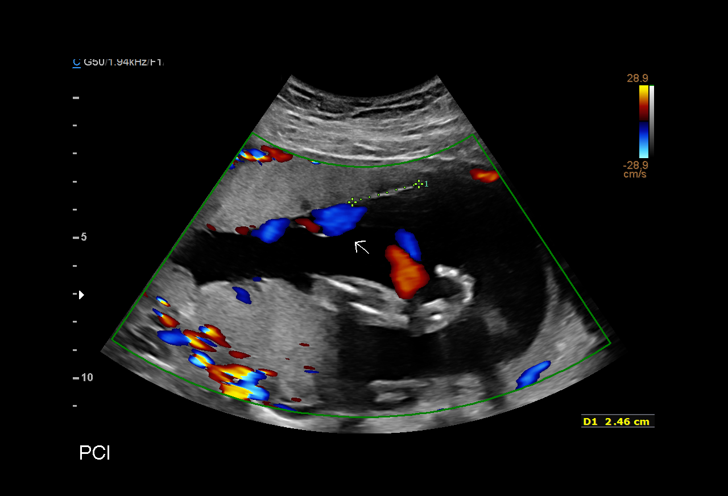
[im 23/67]
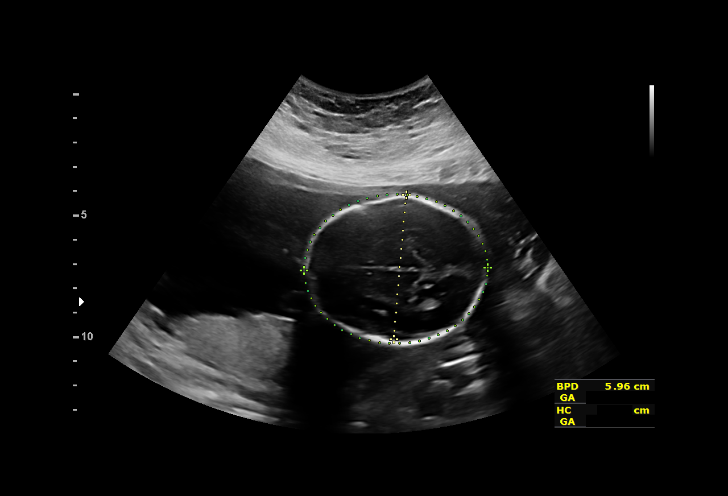
[im 27/67]
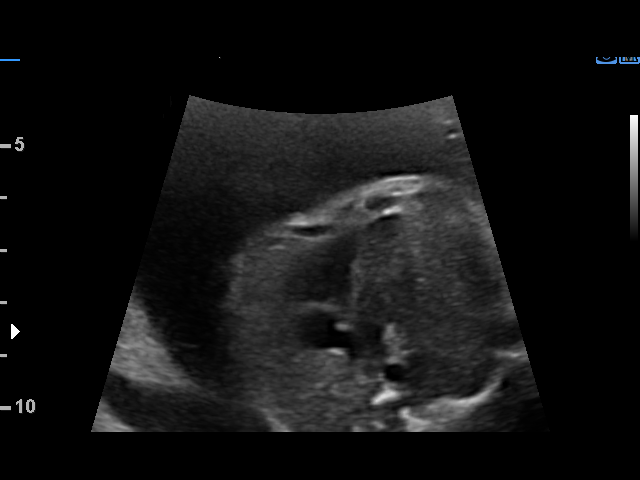
[im 35/67]
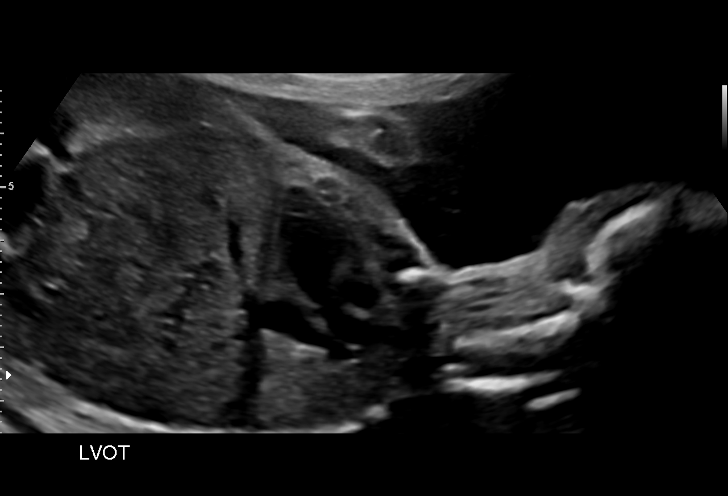
[im 40/67]
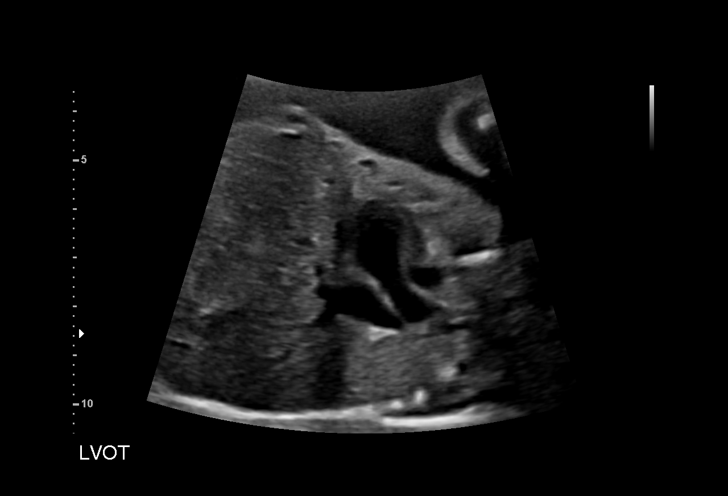
[im 45/67]
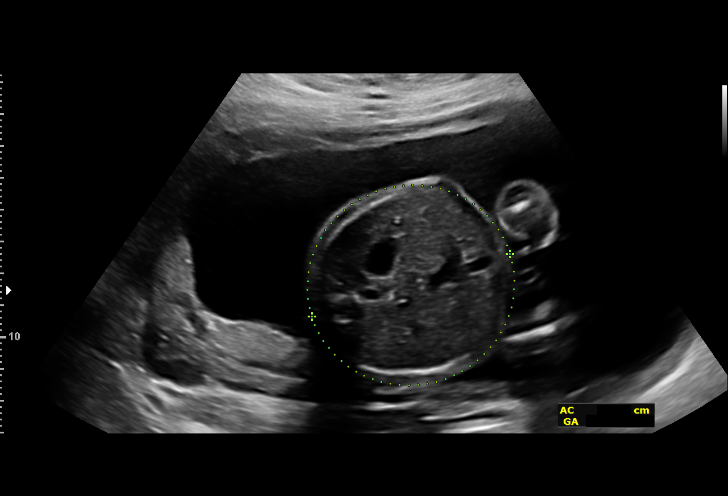
[im 49/67]
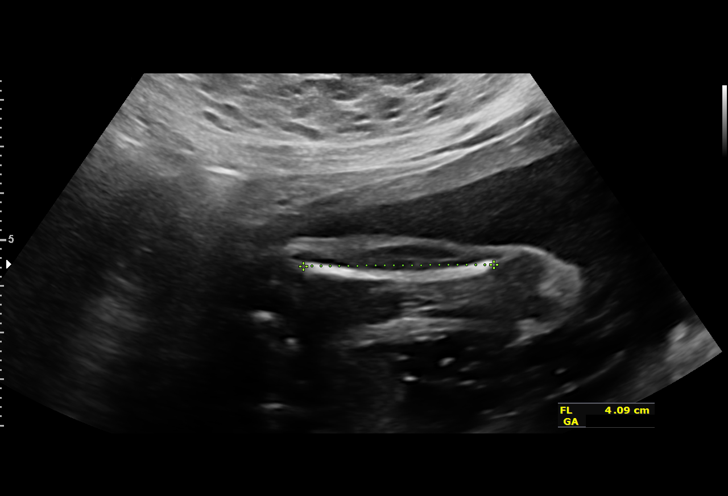
[im 54/67]
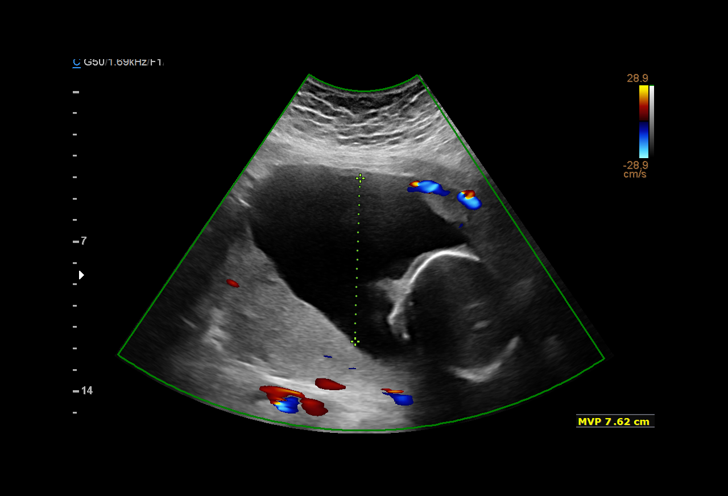
[im 59/67]
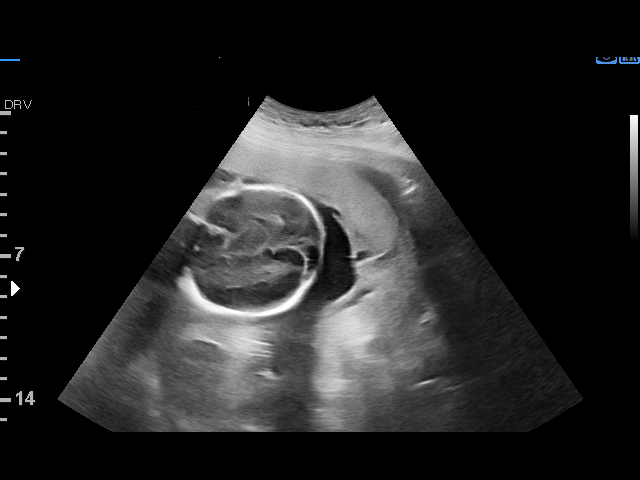
[im 64/67]
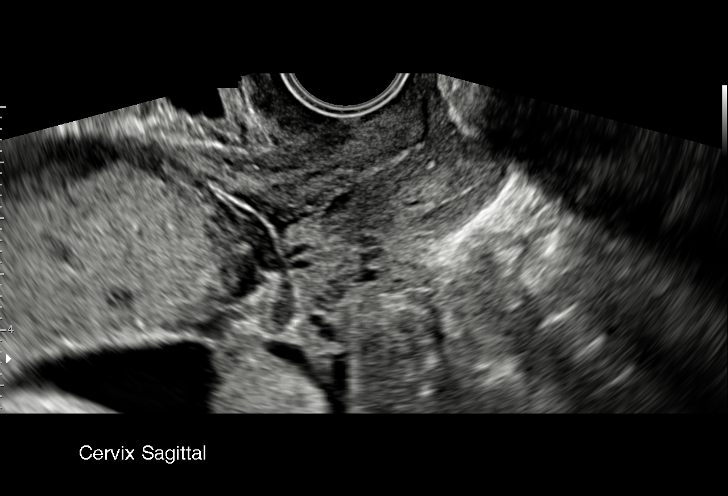

[13 of 28 positions shown; findings below may reference images not displayed]

2  US MFM OB TRANSVAGINAL               76817.2      MIESHA
 ----------------------------------------------------------------------

 ----------------------------------------------------------------------
Indications

  Placenta previa specified as without           [ZY]
  hemorrhage, second trimester
  24 weeks gestation of pregnancy
  Obesity complicating pregnancy, second         [ZY]
  trimester
  Short interval between pregancies, 2nd         [ZY]
  trimester
  Encounter for other antenatal screening        [ZY]
  follow-up
 ----------------------------------------------------------------------
Fetal Evaluation

 Num Of Fetuses:          1
 Fetal Heart Rate(bpm):   137
 Cardiac Activity:        Observed
 Presentation:            Transverse, head to maternal left
 Placenta:                Central previa
 P. Cord Insertion:       Marginal insertion prev seen

 Amniotic Fluid
 AFI FV:      Within normal limits

                             Largest Pocket(cm)

Biometry

 BPD:      59.6  mm     G. Age:  24w 2d         44  %    CI:        77.12   %    70 - 86
                                                         FL/HC:       19.3  %    18.7 -
 HC:      214.9  mm     G. Age:  23w 4d         10  %    HC/AC:       1.08       1.05 -
 AC:      199.5  mm     G. Age:  24w 4d         51  %    FL/BPD:      69.5  %    71 - 87
 FL:       41.4  mm     G. Age:  23w 3d         15  %    FL/AC:       20.8  %    20 - 24
 HUM:      38.3  mm     G. Age:  23w 4d         24  %
 LV:        3.9  mm

 Est. FW:     656   gm     1 lb 7 oz     31  %
OB History

 Gravidity:    3         Term:   1        Prem:   0        SAB:   1
 TOP:          0       Ectopic:  0        Living: 1
Gestational Age

 LMP:           20w 6d        Date:  [DATE]                 EDD:   [DATE]
 U/S Today:     24w 0d                                        EDD:   [DATE]
 Best:          24w 2d     Det. By:  U/S  ([DATE])          EDD:   [DATE]
Anatomy

 Cranium:               Appears normal         LVOT:                   Appears normal
 Cavum:                 Previously seen        Aortic Arch:            Not well visualized
 Ventricles:            Appears normal         Ductal Arch:            Not well visualized
 Choroid Plexus:        Previously seen        Diaphragm:              Previously seen
 Cerebellum:            Previously seen        Stomach:                Appears normal, left
                                                                       sided
 Posterior Fossa:       Previously seen        Abdomen:                Appears normal
 Nuchal Fold:           Previously seen        Abdominal Wall:         Previously seen
 Face:                  Orbits and profile     Cord Vessels:           Previously seen
                        previously seen
 Lips:                  Previously seen        Kidneys:                Appear normal
 Palate:                Not well visualized    Bladder:                Appears normal
 Thoracic:              Appears normal         Spine:                  Previously seen
 Heart:                 Appears normal         Upper Extremities:      Previously seen
                        (4CH, axis, and
                        situs)
 RVOT:                  Appears normal         Lower Extremities:      Previously seen

 Other:  Female gender prev seen.  Nasal bone visualized. Heels and 5th digit
         prev visualized. Technically difficult due to maternal habitus and fetal
         position.
Cervix Uterus Adnexa

 Cervix
 Length:           4.03  cm.
 Normal appearance by transvaginal scan
Impression

 Known placenta previa
 Normal interval growth
 Good fetal movement and amniotic fluid
 Marginal cord insertion not seen today.
Recommendations

 Repeat growth in 4 weeks
 Consider delivery by 35-36 weeks

## 2019-10-14 MED ORDER — DOCUSATE SODIUM 100 MG PO CAPS
100.0000 mg | ORAL_CAPSULE | Freq: Two times a day (BID) | ORAL | Status: DC | PRN
  Administered 2019-10-15 – 2019-10-20 (×6): 100 mg via ORAL
  Filled 2019-10-14 (×6): qty 1

## 2019-10-14 MED ORDER — RHO D IMMUNE GLOBULIN 1500 UNIT/2ML IJ SOSY
300.0000 ug | PREFILLED_SYRINGE | Freq: Once | INTRAMUSCULAR | Status: AC
  Administered 2019-10-14: 17:00:00 300 ug via INTRAVENOUS
  Filled 2019-10-14: qty 2

## 2019-10-14 NOTE — Plan of Care (Signed)
  Problem: Education: Goal: Knowledge of General Education information will improve Description: Including pain rating scale, medication(s)/side effects and non-pharmacologic comfort measures Outcome: Completed/Met   Problem: Activity: Goal: Risk for activity intolerance will decrease Outcome: Completed/Met   Problem: Nutrition: Goal: Adequate nutrition will be maintained Outcome: Completed/Met   Problem: Coping: Goal: Level of anxiety will decrease Outcome: Completed/Met   

## 2019-10-14 NOTE — Progress Notes (Signed)
Daily Antepartum Note  Admission Date: 10/13/2019 Current Date: 10/14/2019 9:49 AM  Madison Cook is a 24 y.o. G3P1011 @ [redacted]w[redacted]d, HD#2, admitted for VB #1 in setting of known previa.  Pregnancy complicated by: Patient Active Problem List   Diagnosis Date Noted  . Transient hypertension of pregnancy 10/14/2019  . COVID-19 affecting pregnancy in second trimester 09/25/2019  . Placenta previa 09/09/2019  . Marginal insertion of umbilical cord affecting management of mother 09/09/2019  . History of gestational hypertension 08/22/2019  . Short interval between pregnancies affecting pregnancy, antepartum 08/22/2019  . Late prenatal care 08/22/2019  . BMI 40.0-44.9, adult ([facility]) 12/17/2018  . Obesity in pregnancy 12/17/2018  . Supervision of high risk pregnancy, antepartum 12/17/2018  . Rh negative state in antepartum period 12/17/2018    Overnight/24hr events:  none  Subjective:  Wore same pad o/n, approx quarter sized area with old blood, slight cramping after voiding this morning. No s/s of pre-eclampsia  Objective:    Current Vital Signs 24h Vital Sign Ranges  T 98.5 F (36.9 C) Temp  Avg: 98.4 F (36.9 C)  Min: 97.6 F (36.4 C)  Max: 98.9 F (37.2 C)  BP (!) 100/52(RN notified) BP  Min: 100/52  Max: 151/82  HR 91 Pulse  Avg: 94.3  Min: 83  Max: 117  RR 18 Resp  Avg: 18.7  Min: 18  Max: 20  SaO2 97 %   SpO2  Avg: 98.8 %  Min: 97 %  Max: 100 %       24 Hour I/O Current Shift I/O  Time Ins Outs 01/10 0701 - 01/11 0700 In: 60 [P.O.:60] Out: -  01/11 0701 - 01/11 1900 In: 0  Out: 350 [Urine:350]   Patient Vitals for the past 24 hrs:  BP Temp Temp src Pulse Resp SpO2 Height Weight  10/14/19 0810 (!) 100/52 98.5 F (36.9 C) Oral 91 18 97 % -- --  10/14/19 0509 139/78 98.5 F (36.9 C) Oral 85 20 100 % -- --  10/14/19 0237 (!) 141/83 -- -- 92 -- -- -- --  10/14/19 0028 (!) 151/82 98.9 F (37.2 C) Oral 95 20 100 % -- --  10/13/19 1956 (!) 151/86 98.6 F (37 C) Oral 97  18 100 % -- --  10/13/19 1814 -- -- -- -- -- -- 5\' 3"  (1.6 m) 108.9 kg  10/13/19 1650 122/71 97.6 F (36.4 C) Oral 83 18 99 % -- --  10/13/19 1540 123/83 -- -- (!) 117 -- 97 % -- --  10/13/19 1532 (!) 145/87 98.4 F (36.9 C) Oral -- 18 -- -- --   FHT: 145 baseline, +accels, no decel, mod variability Toco: quiet x 63m  Physical exam: General: Well nourished, well developed female in no acute distress. Abdomen: gravid nttp Cardiovascular: S1, S2 normal, no murmur, rub or gallop, regular rate and rhythm Respiratory: CTAB Extremities: no clubbing, cyanosis or edema Skin: Warm and dry.  Neuro: 2+brachial  Medications: Current Facility-Administered Medications  Medication Dose Route Frequency Provider Last Rate Last Admin  . acetaminophen (TYLENOL) tablet 650 mg  650 mg Oral Q4H PRN 31m, MD   650 mg at 10/14/19 0022  . betamethasone acetate-betamethasone sodium phosphate (CELESTONE) injection 12 mg  12 mg Intramuscular Q24H 12/12/19, MD   12 mg at 10/13/19 1726  . calcium carbonate (TUMS - dosed in mg elemental calcium) chewable tablet 400 mg of elemental calcium  2 tablet Oral Q4H PRN 12/11/19, MD      .  docusate sodium (COLACE) capsule 100 mg  100 mg Oral BID PRN Aletha Halim, MD      . prenatal multivitamin tablet 1 tablet  1 tablet Oral Q1200 Chancy Milroy, MD   1 tablet at 10/13/19 1727  . zolpidem (AMBIEN) tablet 5 mg  5 mg Oral QHS PRN Chancy Milroy, MD        Labs:  Recent Labs  Lab 10/13/19 1620  WBC 17.8*  HGB 11.8*  HCT 36.6  PLT 242    Recent Labs  Lab 10/13/19 1620  NA 136  K 3.7  CL 105  CO2 22  BUN 8  CREATININE 0.53  CALCIUM 8.8*  PROT 6.2*  BILITOT 0.5  ALKPHOS 48  ALT 11  AST 12*  GLUCOSE 93   A NEG   Radiology: no new imaging  Assessment & Plan:  Pt doing well *Pregnancy: daily NSTs *Previa: rpt imaging today *Rh neg: rhogam ordered *Preterm: BMZ #2 @ 0086 today. Consult NICU prn *CV: no s/s of  pre-eclampsia. She does have a h/o gHTN. Labile BPs here. Will get 24 hour urine. Cbc, cmp normal *PPx: scds *FEN/GI: regular diet, sliv *Dispo: likely inpatient 7-10d  Durene Romans MD Attending Center for [facility])

## 2019-10-15 DIAGNOSIS — O360120 Maternal care for anti-D [Rh] antibodies, second trimester, not applicable or unspecified: Secondary | ICD-10-CM

## 2019-10-15 DIAGNOSIS — O4402 Placenta previa specified as without hemorrhage, second trimester: Secondary | ICD-10-CM

## 2019-10-15 DIAGNOSIS — Z3A25 25 weeks gestation of pregnancy: Secondary | ICD-10-CM

## 2019-10-15 LAB — RH IG WORKUP (INCLUDES ABO/RH)
ABO/RH(D): A NEG
Gestational Age(Wks): 25
Unit division: 0

## 2019-10-15 NOTE — Progress Notes (Signed)
Daily Antepartum Note  Admission Date: 10/13/2019 Current Date: 10/15/2019 9:07 AM  Madison Cook is a 24 y.o. G3P1011 @ [redacted]w[redacted]d, HD#3, admitted for VB #1 in setting of known previa.  Pregnancy complicated by: Patient Active Problem List   Diagnosis Date Noted  . Transient hypertension of pregnancy 10/14/2019  . COVID-19 affecting pregnancy in second trimester 09/25/2019  . Placenta previa 09/09/2019  . Marginal insertion of umbilical cord affecting management of mother 09/09/2019  . History of gestational hypertension 08/22/2019  . Short interval between pregnancies affecting pregnancy, antepartum 08/22/2019  . Late prenatal care 08/22/2019  . BMI 40.0-44.9, adult ([place]) 12/17/2018  . Obesity in pregnancy 12/17/2018  . Supervision of high risk pregnancy, antepartum 12/17/2018  . Rh negative state in antepartum period 12/17/2018    Overnight/24hr events:  none  Subjective:  Just spotting with wiping. No preterm labor s/s.   Objective:    Current Vital Signs 24h Vital Sign Ranges  T 97.7 F (36.5 C) Temp  Avg: 98.1 F (36.7 C)  Min: 97.7 F (36.5 C)  Max: 98.4 F (36.9 C)  BP 107/63 BP  Min: 107/63  Max: 137/84  HR 87 Pulse  Avg: 96  Min: 87  Max: 111  RR 18 Resp  Avg: 18.6  Min: 17  Max: 20  SaO2 96 % Room Air SpO2  Avg: 98 %  Min: 96 %  Max: 99 %       24 Hour I/O Current Shift I/O  Time Ins Outs 01/11 0701 - 01/12 0700 In: 2690 [P.O.:2690] Out: 750 [Urine:750] No intake/output data recorded.   Patient Vitals for the past 24 hrs:  BP Temp Temp src Pulse Resp SpO2  10/15/19 0741 107/63 97.7 F (36.5 C) Oral 87 18 96 %  10/14/19 2253 123/72 98.3 F (36.8 C) Oral 89 20 99 %  10/14/19 1914 137/84 98.4 F (36.9 C) Oral 99 20 98 %  10/14/19 1510 124/65 98.1 F (36.7 C) Oral 94 17 99 %  10/14/19 1130 (!) 112/57 98.1 F (36.7 C) Oral (!) 111 18 98 %   FHT: 140 baseline, +accels, no decel, mod variability Toco: quiet x 43m  Physical exam: General: Well  nourished, well developed female in no acute distress. Abdomen: gravid nttp Cardiovascular: S1, S2 normal, no murmur, rub or gallop, regular rate and rhythm Respiratory: CTAB Extremities: no clubbing, cyanosis or edema Skin: Warm and dry.  Neuro: 2+brachial  Medications: Current Facility-Administered Medications  Medication Dose Route Frequency Provider Last Rate Last Admin  . acetaminophen (TYLENOL) tablet 650 mg  650 mg Oral Q4H PRN Chancy Milroy, MD   650 mg at 10/14/19 0022  . calcium carbonate (TUMS - dosed in mg elemental calcium) chewable tablet 400 mg of elemental calcium  2 tablet Oral Q4H PRN Chancy Milroy, MD      . docusate sodium (COLACE) capsule 100 mg  100 mg Oral BID PRN Aletha Halim, MD      . prenatal multivitamin tablet 1 tablet  1 tablet Oral Q1200 Chancy Milroy, MD   1 tablet at 10/14/19 1413  . zolpidem (AMBIEN) tablet 5 mg  5 mg Oral QHS PRN Chancy Milroy, MD   5 mg at 10/14/19 2258    Labs:  Recent Labs  Lab 10/13/19 1620  WBC 17.8*  HGB 11.8*  HCT 36.6  PLT 242    Recent Labs  Lab 10/13/19 1620  NA 136  K 3.7  CL 105  CO2 22  BUN 8  CREATININE 0.53  CALCIUM 8.8*  PROT 6.2*  BILITOT 0.5  ALKPHOS 48  ALT 11  AST 12*  GLUCOSE 93   A NEG   Radiology: no new imaging  Assessment & Plan:  Pt doing well *Pregnancy: daily NSTs *Previa: inpatient 7 days *Rh neg: s/p rhogam 1/11 *Preterm: BMZ course 1/10-11. Consult NICU prn *CV: no s/s of pre-eclampsia. She does have a h/o gHTN. Labile BPs here. Start 24 hour urine today Cbc, cmp normal *PPx: scds *FEN/GI: regular diet, sliv *Dispo: likely inpatient 7-10d  Cornelia Copa MD Attending Center for [facility])

## 2019-10-16 DIAGNOSIS — Z3A25 25 weeks gestation of pregnancy: Secondary | ICD-10-CM

## 2019-10-16 DIAGNOSIS — O360120 Maternal care for anti-D [Rh] antibodies, second trimester, not applicable or unspecified: Secondary | ICD-10-CM

## 2019-10-16 DIAGNOSIS — O4402 Placenta previa specified as without hemorrhage, second trimester: Secondary | ICD-10-CM

## 2019-10-16 LAB — CREATININE, URINE, 24 HOUR
Collection Interval-UCRE24: 24 hours
Creatinine, 24H Ur: 1228 mg/d (ref 600–1800)
Creatinine, Urine: 49.11 mg/dL
Urine Total Volume-UCRE24: 2500 mL

## 2019-10-16 LAB — PROTEIN, URINE, 24 HOUR
Collection Interval-UPROT: 24 hours
Protein, 24H Urine: 175 mg/d — ABNORMAL HIGH (ref 50–100)
Protein, Urine: 7 mg/dL
Urine Total Volume-UPROT: 2500 mL

## 2019-10-16 NOTE — Progress Notes (Signed)
Daily Antepartum Note  Admission Date: 10/13/2019 Current Date: 10/16/2019 11:58 AM  Madison Cook is a 24 y.o. G3P1011 @ [redacted]w[redacted]d, HD#4, admitted for VB #1 in setting of known previa.  Pregnancy complicated by: Patient Active Problem List   Diagnosis Date Noted  . Transient hypertension of pregnancy 10/14/2019  . COVID-19 affecting pregnancy in second trimester 09/25/2019  . Placenta previa 09/09/2019  . Marginal insertion of umbilical cord affecting management of mother 09/09/2019  . History of gestational hypertension 08/22/2019  . Short interval between pregnancies affecting pregnancy, antepartum 08/22/2019  . Late prenatal care 08/22/2019  . BMI 40.0-44.9, adult ([place]) 12/17/2018  . Obesity in pregnancy 12/17/2018  . Supervision of high risk pregnancy, antepartum 12/17/2018  . Rh negative state in antepartum period 12/17/2018    Overnight/24hr events:  none  Subjective:  No bleeding or spotting. No complaints  Objective:    Current Vital Signs 24h Vital Sign Ranges  T 98.1 F (36.7 C) Temp  Avg: 98.3 F (36.8 C)  Min: 98.1 F (36.7 C)  Max: 98.4 F (36.9 C)  BP 110/67 BP  Min: 110/67  Max: 139/69  HR 81 Pulse  Avg: 89.5  Min: 81  Max: 98  RR 18 Resp  Avg: 17.7  Min: 15  Max: 20  SaO2 99 % Room Air SpO2  Avg: 98 %  Min: 96 %  Max: 100 %       24 Hour I/O Current Shift I/O  Time Ins Outs 01/12 0701 - 01/13 0700 In: 2490 [P.O.:2490] Out: 3000 [Urine:3000] 01/13 0701 - 01/13 1900 In: -  Out: 500 [Urine:500]   Patient Vitals for the past 24 hrs:  BP Temp Temp src Pulse Resp SpO2  10/16/19 0813 110/67 98.1 F (36.7 C) Oral 81 18 99 %  10/16/19 0259 131/75 98.4 F (36.9 C) Oral 98 20 100 %  10/15/19 2242 130/72 98.3 F (36.8 C) Oral 88 18 98 %  10/15/19 1951 131/77 98.4 F (36.9 C) Oral 95 18 99 %  10/15/19 1529 139/69 98.2 F (36.8 C) Oral 88 15 96 %  10/15/19 1230 117/70 98.2 F (36.8 C) Oral 87 17 96 %   FHT: 145 baseline, +accels, no decel, mod  variability Toco: quiet x 22m  Physical exam: General: Well nourished, well developed female in no acute distress. Abdomen: gravid nttp Cardiovascular: S1, S2 normal, no murmur, rub or gallop, regular rate and rhythm Respiratory: CTAB Extremities: no clubbing, cyanosis or edema Skin: Warm and dry.  Neuro: 2+brachial  Medications: Current Facility-Administered Medications  Medication Dose Route Frequency Provider Last Rate Last Admin  . acetaminophen (TYLENOL) tablet 650 mg  650 mg Oral Q4H PRN Chancy Milroy, MD   650 mg at 10/14/19 0022  . calcium carbonate (TUMS - dosed in mg elemental calcium) chewable tablet 400 mg of elemental calcium  2 tablet Oral Q4H PRN Chancy Milroy, MD      . docusate sodium (COLACE) capsule 100 mg  100 mg Oral BID PRN Aletha Halim, MD   100 mg at 10/15/19 1656  . prenatal multivitamin tablet 1 tablet  1 tablet Oral Q1200 Chancy Milroy, MD   1 tablet at 10/16/19 1132  . zolpidem (AMBIEN) tablet 5 mg  5 mg Oral QHS PRN Chancy Milroy, MD   5 mg at 10/15/19 2344    Labs:  Recent Labs  Lab 10/13/19 1620  WBC 17.8*  HGB 11.8*  HCT 36.6  PLT 242  Recent Labs  Lab 10/13/19 1620  NA 136  K 3.7  CL 105  CO2 22  BUN 8  CREATININE 0.53  CALCIUM 8.8*  PROT 6.2*  BILITOT 0.5  ALKPHOS 48  ALT 11  AST 12*  GLUCOSE 93   A NEG  24h urine protein: 175  Radiology: no new imaging  Assessment & Plan:  Pt doing well *Pregnancy: daily NSTs. Reactive NST *Previa: inpatient 7 days *Rh neg: s/p rhogam 1/11 *Preterm: BMZ course 1/10-11. Consult NICU prn *CV: no s/s of pre-eclampsia. She does have a h/o gHTN. Labile BPs here. Negative labs, 24h urine *PPx: scds *FEN/GI: regular diet, sliv *Dispo: likely inpatient 7-10d  Cornelia Copa MD Attending Center for [facility])

## 2019-10-17 NOTE — Progress Notes (Signed)
Patient ID: Madison Cook, female   DOB: May 30, 1996, 24 y.o.   MRN: [id number] FACULTY PRACTICE ANTEPARTUM(COMPREHENSIVE) NOTE  Madison Cook is a 24 y.o. G3P1011 with Estimated Date of Delivery: 01/23/20   By   [redacted]w[redacted]d  who is admitted for bleeding from a placenta previa.    Fetal presentation is unsure. Length of Stay:  4  Days  Date of admission:10/13/2019  Subjective: No bleeding at all Patient reports the fetal movement as active. Patient reports uterine contraction  activity as none. Patient reports  vaginal bleeding as none. Patient describes fluid per vagina as None.  Vitals:  Blood pressure 121/71, pulse 95, temperature 98.4 F (36.9 C), temperature source Oral, resp. rate 18, height 5\' 3"  (1.6 m), weight 108.9 kg, last menstrual period 05/21/2019, SpO2 98 %, unknown if currently breastfeeding. Vitals:   10/16/19 1548 10/16/19 1941 10/16/19 2347 10/17/19 0610  BP: (!) 141/89 121/76 128/69 121/71  Pulse: 96 (!) 111 (!) 102 95  Resp: 18  18 18   Temp: 98.4 F (36.9 C) 98.2 F (36.8 C) 98.3 F (36.8 C) 98.4 F (36.9 C)  TempSrc: Oral Oral Oral Oral  SpO2: 98% 98% 99% 98%  Weight:      Height:       Physical Examination:  General appearance - alert, well appearing, and in no distress Abdomen - soft, nontender, nondistended, no masses or organomegaly Fundal Height:  size equals dates Pelvic Exam:  examination not indicated Cervical Exam: Not evaluated.Extremities: extremities normal, atraumatic, no cyanosis or edema with DTRs 2+ bilaterally Membranes:intact  Fetal Monitoring:  Baseline: 150 bpm, Variability: Good {> 6 bpm), Accelerations: Reactive and Decelerations: Absent   reactive  Labs:  Results for orders placed or performed during the hospital encounter of 10/13/19 (from the past 24 hour(s))  Type and screen [facility]   Collection Time: 10/16/19  3:59 PM  Result Value Ref Range   ABO/RH(D) A NEG    Antibody Screen POS    Sample Expiration  10/19/2019,2359    Antibody Identification PASSIVELY ACQUIRED ANTI-D    Unit Number 10/18/19    Blood Component Type RED CELLS,LR    Unit division 00    Status of Unit ALLOCATED    Transfusion Status OK TO TRANSFUSE    Crossmatch Result COMPATIBLE    Unit Number 10/21/2019    Blood Component Type RED CELLS,LR    Unit division 00    Status of Unit ALLOCATED    Transfusion Status OK TO TRANSFUSE    Crossmatch Result COMPATIBLE   BPAM RBC   Collection Time: 10/16/19  3:59 PM  Result Value Ref Range   Blood Product Unit Number [id number]    PRODUCT CODE 10/18/19    Unit Type and Rh 9500    Blood Product Expiration Date [id number]    Blood Product Unit Number [id number]    PRODUCT CODE [id number]    Unit Type and Rh 9500    Blood Product Expiration Date [id number]     Imaging Studies:    [id number] MFM OB Transvaginal  Result Date: 10/14/2019 ----------------------------------------------------------------------  OBSTETRICS REPORT                       (Signed Final 10/14/2019 08:56 pm) ---------------------------------------------------------------------- Patient Info  ID #:       12/12/2019                          D.O.B.:  1996-09-08 (  24 yrs)  Name:       Madison Cook                Visit Date: 10/14/2019 12:49 pm ---------------------------------------------------------------------- Performed By  Performed By:     Tomma Lightning             Ref. Address:      [address]                    [address]                                                              [address]  Attending:        Lin Landsman      [facility]  Referred By:      [facility] [place] ---------------------------------------------------------------------- Orders   #  Description                          Code         Ordered By   1  [place] MFM OB FOLLOW UP                  [phone]     [place] Bing   2  [place] MFM OB  TRANSVAGINAL               [id number]      CHARLIE PICKENS  ----------------------------------------------------------------------   #  Order #                    Accession #                 Episode #   1  [id number]                  [id number]   2  [id number]                  [id number]                  [id number]  ---------------------------------------------------------------------- Indications   Placenta previa specified as without           O44.02   hemorrhage, second trimester   [redacted] weeks gestation of pregnancy                Z3A.24   Obesity complicating pregnancy, second         O99.212   trimester   Short interval between pregancies, 2nd         O09.892   trimester   Encounter for other antenatal screening        Z36.2   follow-up  ---------------------------------------------------------------------- Fetal Evaluation  Num Of Fetuses:  1  Fetal Heart Rate(bpm):   137  Cardiac Activity:        Observed  Presentation:            Transverse, head to maternal left  Placenta:                Central previa  P. Cord Insertion:       Marginal insertion prev seen  Amniotic Fluid  AFI FV:      Within normal limits                              Largest Pocket(cm)                              7.62 ---------------------------------------------------------------------- Biometry  BPD:      59.6  mm     G. Age:  24w 2d         44  %    CI:        77.12   %    70 - 86                                                          FL/HC:       19.3  %    18.7 - 20.9  HC:      214.9  mm     G. Age:  23w 4d         10  %    HC/AC:       1.08       1.05 - 1.21  AC:      199.5  mm     G. Age:  24w 4d         51  %    FL/BPD:      69.5  %    71 - 87  FL:       41.4  mm     G. Age:  23w 3d         15  %    FL/AC:       20.8  %    20 - 24  HUM:      38.3  mm     G. Age:  23w 4d         24  %  LV:        3.9  mm  Est. FW:     656   gm     1 lb 7 oz     31  %  ---------------------------------------------------------------------- OB History  Gravidity:    3         Term:   1        Prem:   0        SAB:   1  TOP:          0       Ectopic:  0        Living: 1 ---------------------------------------------------------------------- Gestational Age  LMP:           20w 6d        Date:  05/21/19  EDD:   02/25/20  U/S Today:     24w 0d                                        EDD:   02/03/20  Best:          24w 2d     Det. By:  U/S  (09/04/19)          EDD:   02/01/20 ---------------------------------------------------------------------- Anatomy  Cranium:               Appears normal         LVOT:                   Appears normal  Cavum:                 Previously seen        Aortic Arch:            Not well visualized  Ventricles:            Appears normal         Ductal Arch:            Not well visualized  Choroid Plexus:        Previously seen        Diaphragm:              Previously seen  Cerebellum:            Previously seen        Stomach:                Appears normal, left                                                                        sided  Posterior Fossa:       Previously seen        Abdomen:                Appears normal  Nuchal Fold:           Previously seen        Abdominal Wall:         Previously seen  Face:                  Orbits and profile     Cord Vessels:           Previously seen                         previously seen  Lips:                  Previously seen        Kidneys:                Appear normal  Palate:                Not well visualized    Bladder:                Appears normal  Thoracic:  Appears normal         Spine:                  Previously seen  Heart:                 Appears normal         Upper Extremities:      Previously seen                         (4CH, axis, and                         situs)  RVOT:                  Appears normal         Lower Extremities:      Previously seen  Other:  Female  gender prev seen.  Nasal bone visualized. Heels and 5th digit          prev visualized. Technically difficult due to maternal habitus and fetal          position. ---------------------------------------------------------------------- Cervix Uterus Adnexa  Cervix  Length:           4.03  cm.  Normal appearance by transvaginal scan ---------------------------------------------------------------------- Impression  Known placenta previa  Normal interval growth  Good fetal movement and amniotic fluid  Marginal cord insertion not seen today. ---------------------------------------------------------------------- Recommendations  Repeat growth in 4 weeks  Consider delivery by 35-36 weeks ----------------------------------------------------------------------               Lin Landsman, MD Electronically Signed Final Report   10/14/2019 08:56 pm ----------------------------------------------------------------------  [place] MFM OB FOLLOW UP  Result Date: 10/14/2019 ----------------------------------------------------------------------  OBSTETRICS REPORT                       (Signed Final 10/14/2019 08:56 pm) ---------------------------------------------------------------------- Patient Info  ID #:       [id number]                          D.O.B.:  Sep 15, 1996 (23 yrs)  Name:       Madison Cook                Visit Date: 10/14/2019 12:49 pm ---------------------------------------------------------------------- Performed By  Performed By:     Tomma Lightning             Ref. Address:      [address]                    [address]                                                              [address]  Attending:        Lin Landsman      [facility]  [facility]      [facility] [place] ---------------------------------------------------------------------- Orders   #  Description                          Code         Ordered By   1   US MFM OB FOLLOW UP                  [id number]     [place] BingCHARLIE PICKENS   2  US MFM OB TRANSVAGINAL               [id number]      CHARLIE PICKENS  ----------------------------------------------------------------------   #  Order #                    Accession #                 Episode #   1  [id number]                  [phone]                  [id number]   2  [id number]                  [id number]  ---------------------------------------------------------------------- Indications   Placenta previa specified as without           O44.02   hemorrhage, second trimester   [redacted] weeks gestation of pregnancy                Z3A.24   Obesity complicating pregnancy, second         O99.212   trimester   Short interval between pregancies, 2nd         O09.892   trimester   Encounter for other antenatal screening        Z36.2   follow-up  ---------------------------------------------------------------------- Fetal Evaluation  Num Of Fetuses:          1  Fetal Heart Rate(bpm):   137  Cardiac Activity:        Observed  Presentation:            Transverse, head to maternal left  Placenta:                Central previa  P. Cord Insertion:       Marginal insertion prev seen  Amniotic Fluid  AFI FV:      Within normal limits                              Largest Pocket(cm)                              7.62 ---------------------------------------------------------------------- Biometry  BPD:      59.6  mm     G. Age:  24w 2d         44  %    CI:        77.12   %    70 - 86  FL/HC:       19.3  %    18.7 - 20.9  HC:      214.9  mm     G. Age:  23w 4d         10  %    HC/AC:       1.08       1.05 - 1.21  AC:      199.5  mm     G. Age:  24w 4d         51  %    FL/BPD:      69.5  %    71 - 87  FL:       41.4  mm     G. Age:  23w 3d         15  %    FL/AC:       20.8  %    20 - 24  HUM:      38.3  mm     G. Age:  23w 4d         24  %  LV:        3.9  mm  Est. FW:     656    gm     1 lb 7 oz     31  % ---------------------------------------------------------------------- OB History  Gravidity:    3         Term:   1        Prem:   0        SAB:   1  TOP:          0       Ectopic:  0        Living: 1 ---------------------------------------------------------------------- Gestational Age  LMP:           20w 6d        Date:  05/21/19                 EDD:   02/25/20  U/S Today:     24w 0d                                        EDD:   02/03/20  Best:          24w 2d     Det. By:  U/S  (09/04/19)          EDD:   02/01/20 ---------------------------------------------------------------------- Anatomy  Cranium:               Appears normal         LVOT:                   Appears normal  Cavum:                 Previously seen        Aortic Arch:            Not well visualized  Ventricles:            Appears normal         Ductal Arch:            Not well visualized  Choroid Plexus:        Previously seen        Diaphragm:  Previously seen  Cerebellum:            Previously seen        Stomach:                Appears normal, left                                                                        sided  Posterior Fossa:       Previously seen        Abdomen:                Appears normal  Nuchal Fold:           Previously seen        Abdominal Wall:         Previously seen  Face:                  Orbits and profile     Cord Vessels:           Previously seen                         previously seen  Lips:                  Previously seen        Kidneys:                Appear normal  Palate:                Not well visualized    Bladder:                Appears normal  Thoracic:              Appears normal         Spine:                  Previously seen  Heart:                 Appears normal         Upper Extremities:      Previously seen                         (4CH, axis, and                         situs)  RVOT:                  Appears normal         Lower Extremities:       Previously seen  Other:  Female gender prev seen.  Nasal bone visualized. Heels and 5th digit          prev visualized. Technically difficult due to maternal habitus and fetal          position. ---------------------------------------------------------------------- Cervix Uterus Adnexa  Cervix  Length:           4.03  cm.  Normal appearance by transvaginal scan ---------------------------------------------------------------------- Impression  Known placenta previa  Normal interval growth  Good fetal movement and amniotic fluid  Marginal cord insertion not seen today. ---------------------------------------------------------------------- Recommendations  Repeat growth in 4 weeks  Consider delivery by 35-36 weeks ----------------------------------------------------------------------               Lin Landsman, MD Electronically Signed Final Report   10/14/2019 08:56 pm ----------------------------------------------------------------------    Medications:  Scheduled . prenatal multivitamin  1 tablet Oral Q1200   I have reviewed the patient's current medications.  ASSESSMENT: G3P1011 [redacted]w[redacted]d Estimated Date of Delivery: 01/23/20  Central placenta previa,complete, with bleeding 10/14/19 Patient Active Problem List   Diagnosis Date Noted  . Transient hypertension of pregnancy 10/14/2019  . COVID-19 affecting pregnancy in second trimester 09/25/2019  . Placenta previa 09/09/2019  . History of gestational hypertension 08/22/2019  . Short interval between pregnancies affecting pregnancy, antepartum 08/22/2019  . Late prenatal care 08/22/2019  . BMI 40.0-44.9, adult (HCC) 12/17/2018  . Obesity in pregnancy 12/17/2018  . Supervision of high risk pregnancy, antepartum 12/17/2018  . Rh negative state in antepartum period 12/17/2018    PLAN: >Continue in house observation until 10/21/19 or based on further bleeding episodes >S/P BMZ course 1/10, 1/11 >S/P RhoGam  >continue daily EFM   Omelia Blackwater H  Kermitt Harjo 10/17/2019,7:38 AM

## 2019-10-18 ENCOUNTER — Encounter (HOSPITAL_COMMUNITY): Payer: Self-pay

## 2019-10-18 ENCOUNTER — Ambulatory Visit (HOSPITAL_COMMUNITY): Payer: Commercial Managed Care - PPO

## 2019-10-18 DIAGNOSIS — O360120 Maternal care for anti-D [Rh] antibodies, second trimester, not applicable or unspecified: Secondary | ICD-10-CM

## 2019-10-18 DIAGNOSIS — O4402 Placenta previa specified as without hemorrhage, second trimester: Secondary | ICD-10-CM

## 2019-10-18 DIAGNOSIS — Z3A26 26 weeks gestation of pregnancy: Secondary | ICD-10-CM

## 2019-10-18 NOTE — Progress Notes (Signed)
Daily Antepartum Note  Admission Date: 10/13/2019 Current Date: 10/18/2019 11:56 AM  Madison Cook is a 24 y.o. G3P1011 @ [redacted]w[redacted]d, HD#6, admitted for VB #1 in setting of known previa.  Last bleed: 1/11 old blood  Pregnancy complicated by: Patient Active Problem List   Diagnosis Date Noted  . Transient hypertension of pregnancy 10/14/2019  . COVID-19 affecting pregnancy in second trimester 09/25/2019  . Placenta previa 09/09/2019  . History of gestational hypertension 08/22/2019  . Short interval between pregnancies affecting pregnancy, antepartum 08/22/2019  . Late prenatal care 08/22/2019  . BMI 40.0-44.9, adult ([place]) 12/17/2018  . Obesity in pregnancy 12/17/2018  . Supervision of high risk pregnancy, antepartum 12/17/2018  . Rh negative state in antepartum period 12/17/2018    Overnight/24hr events:  none  Subjective:  No bleeding or spotting. No complaints  Objective:    Current Vital Signs 24h Vital Sign Ranges  T 98.2 F (36.8 C) Temp  Avg: 98.2 F (36.8 C)  Min: 97.9 F (36.6 C)  Max: 98.3 F (36.8 C)  BP 128/76 BP  Min: 121/85  Max: 138/90  HR 96 Pulse  Avg: 102.2  Min: 96  Max: 105  RR 18 Resp  Avg: 18  Min: 18  Max: 18  SaO2 98 % Room Air SpO2  Avg: 98.2 %  Min: 97 %  Max: 99 %       24 Hour I/O Current Shift I/O  Time Ins Outs 01/14 0701 - 01/15 0700 In: -  Out: 2500 [Urine:2500] No intake/output data recorded.   Patient Vitals for the past 24 hrs:  BP Temp Temp src Pulse Resp SpO2  10/18/19 0815 128/76 98.2 F (36.8 C) Oral 96 18 98 %  10/18/19 0548 125/80 98 F (36.7 C) Oral (!) 104 18 98 %  10/17/19 2336 138/90 97.9 F (36.6 C) Oral (!) 103 18 97 %  10/17/19 1945 134/80 98.3 F (36.8 C) Oral (!) 104 18 98 %  10/17/19 1545 121/85 98.2 F (36.8 C) Oral (!) 105 18 99 %  10/17/19 1158 137/78 98.3 F (36.8 C) Oral (!) 101 18 99 %   FHT: 140 baseline, +accels, no decel, mod variability Toco: quiet x 83m  Physical exam: General: Well  nourished, well developed female in no acute distress. Abdomen: gravid nttp Cardiovascular: S1, S2 normal, no murmur, rub or gallop, regular rate and rhythm Respiratory: CTAB Extremities: no clubbing, cyanosis or edema Skin: Warm and dry.  Neuro: 2+brachial  Medications: Current Facility-Administered Medications  Medication Dose Route Frequency Provider Last Rate Last Admin  . acetaminophen (TYLENOL) tablet 650 mg  650 mg Oral Q4H PRN Chancy Milroy, MD   650 mg at 10/17/19 0630  . calcium carbonate (TUMS - dosed in mg elemental calcium) chewable tablet 400 mg of elemental calcium  2 tablet Oral Q4H PRN Chancy Milroy, MD      . docusate sodium (COLACE) capsule 100 mg  100 mg Oral BID PRN Aletha Halim, MD   100 mg at 10/17/19 2305  . prenatal multivitamin tablet 1 tablet  1 tablet Oral Q1200 Chancy Milroy, MD   1 tablet at 10/17/19 1301  . zolpidem (AMBIEN) tablet 5 mg  5 mg Oral QHS PRN Chancy Milroy, MD   5 mg at 10/16/19 2340    Labs:  Recent Labs  Lab 10/13/19 1620  WBC 17.8*  HGB 11.8*  HCT 36.6  PLT 242    Recent Labs  Lab 10/13/19 1620  NA  136  K 3.7  CL 105  CO2 22  BUN 8  CREATININE 0.53  CALCIUM 8.8*  PROT 6.2*  BILITOT 0.5  ALKPHOS 48  ALT 11  AST 12*  GLUCOSE 93   A NEG  24h urine protein: 175  Radiology: no new imaging  Assessment & Plan:  Pt doing well *Pregnancy: daily NSTs. Reactive NST *Previa: inpatient 7 days *Rh neg: s/p rhogam 1/11 *Preterm: BMZ course 1/10-11. Consult NICU prn *CV: no s/s of pre-eclampsia. She does have a h/o gHTN. Normal BPs currently. Negative labs, 24h urine *PPx: scds *FEN/GI: regular diet, sliv *Dispo: likely home monday  Cornelia Copa. MD Attending Center for [facility])

## 2019-10-19 DIAGNOSIS — O4402 Placenta previa specified as without hemorrhage, second trimester: Secondary | ICD-10-CM

## 2019-10-19 DIAGNOSIS — Z3A26 26 weeks gestation of pregnancy: Secondary | ICD-10-CM

## 2019-10-19 DIAGNOSIS — O360120 Maternal care for anti-D [Rh] antibodies, second trimester, not applicable or unspecified: Secondary | ICD-10-CM

## 2019-10-19 NOTE — Progress Notes (Signed)
FACULTY PRACTICE ANTEPARTUM(COMPREHENSIVE) NOTE  Madison Cook is a 24 y.o. G3P1011 at [redacted]w[redacted]d  who is admitted for placenta previa with  Postcoital bleeding  She is Rh NEG and received Rhogam 1/11 Last bleeding 1/11 Fetal presentation is unsure. Length of Stay:  6  Days  Subjective: No complaints no bleeding Patient reports the fetal movement as active. Patient reports uterine contraction  activity as none. Patient reports  vaginal bleeding as none. Patient describes fluid per vagina as None.  Vitals:  Blood pressure 119/72, pulse (!) 102, temperature 97.9 F (36.6 C), temperature source Oral, resp. rate 18, height 5\' 3"  (1.6 m), weight 108.9 kg, last menstrual period 05/21/2019, SpO2 99 %, unknown if currently breastfeeding. Physical Examination:  General appearance - alert, well appearing, and in no distress Heart - normal rate and regular rhythm Abdomen - soft, nontender, nondistended Fundal Height:  size equals dates Cervical Exam:Extremities: extremities normal, atraumatic, no cyanosis or edema and Homans sign is negative, no sign of DVT with DTRs 2+ bilaterally Membranes:intact  Fetal Monitoring:  Baseline: 140 bpm, Variability: Good {> 6 bpm), Accelerations: Reactive and Decelerations: Absent  Labs:  No results found for this or any previous visit (from the past 24 hour(s)).  Imaging Studies:     Medications:  Scheduled . prenatal multivitamin  1 tablet Oral Q1200   I have reviewed the patient's current medications.  ASSESSMENT: Patient Active Problem List   Diagnosis Date Noted  . Transient hypertension of pregnancy 10/14/2019  . COVID-19 affecting pregnancy in second trimester 09/25/2019  . Placenta previa 09/09/2019  . History of gestational hypertension 08/22/2019  . Short interval between pregnancies affecting pregnancy, antepartum 08/22/2019  . Late prenatal care 08/22/2019  . BMI 40.0-44.9, adult ([facility]CC) 12/17/2018  . Obesity in pregnancy 12/17/2018  .  Supervision of high risk pregnancy, antepartum 12/17/2018  . Rh negative state in antepartum period 12/17/2018    PLAN: Pt doing well *Pregnancy: daily NSTs. Reactive NST *Previa: inpatient 7 days *Rh neg: s/p rhogam 1/11 *Preterm: BMZ course 1/10-11. Consult NICU prn *CV: no s/s of pre-eclampsia. She does have a h/o gHTN. Normal BPs currently. Negative labs, 24h urine *PPx: scds *FEN/GI: regular diet, sliv *Dispo: likely home monday  01-04-1988 10/19/2019,7:38 AM    Patient ID: Madison Cook, female   DOB: 1996/06/20, 24 y.o.   MRN: [id number]

## 2019-10-20 LAB — BPAM RBC
Blood Product Expiration Date: 202102152359
Blood Product Expiration Date: 202102212359
Unit Type and Rh: 9500
Unit Type and Rh: 9500

## 2019-10-20 LAB — TYPE AND SCREEN
ABO/RH(D): A NEG
Antibody Screen: POSITIVE
Unit division: 0
Unit division: 0

## 2019-10-20 NOTE — Progress Notes (Signed)
Patient ID: Madison Cook, female   DOB: November 01, 1995, 24 y.o.   MRN: [id number] [place]) NOTE  Madison Cook is a 24 y.o. G3P1011 at [redacted]w[redacted]d  who is admitted for placenta previa with postcoital bleeding  She is Rh NEG and received Rhogam 1/11 Last bleeding 1/11 Fetal presentation is unsure. Length of Stay:  7  Days  Subjective: Patient is doing well and denies any vaginal bleeding Patient reports the fetal movement as active. Patient reports uterine contraction  activity as none. Patient reports  vaginal bleeding as none. Patient describes fluid per vagina as None.  Vitals:  Blood pressure (!) 115/58, pulse 96, temperature 97.6 F (36.4 C), temperature source Oral, resp. rate 18, height 5\' 3"  (1.6 m), weight 108.9 kg, last menstrual period 05/21/2019, SpO2 100 %, unknown if currently breastfeeding. Physical Examination:  General appearance - alert, well appearing, and in no distress Heart - normal rate and regular rhythm Abdomen - soft, nontender, nondistended Fundal Height:  size equals dates Extremities: extremities normal, atraumatic, no cyanosis or edema and Homans sign is negative, no sign of DVT with DTRs 2+ bilaterally Membranes:intact  Fetal Monitoring:  Baseline: 140 bpm, Variability: Good {> 6 bpm), Accelerations: Reactive and Decelerations: Absent  Labs:  Results for orders placed or performed during the hospital encounter of 10/13/19 (from the past 24 hour(s))  Type and screen [place]   Collection Time: 10/19/19 11:41 PM  Result Value Ref Range   ABO/RH(D) A NEG    Antibody Screen POS    Sample Expiration 10/22/2019,2359    Antibody Identification      PASSIVELY ACQUIRED ANTI-D Performed at [facility], [address], [place], [place] [address]    Unit Number [id number]    Blood Component Type RED CELLS,LR    Unit division 00    Status of Unit ALLOCATED    Transfusion Status OK TO TRANSFUSE    Crossmatch Result COMPATIBLE    Unit Number [id number]    Blood Component Type RED CELLS,LR    Unit division 00    Status of Unit ALLOCATED    Transfusion Status OK TO TRANSFUSE    Crossmatch Result COMPATIBLE   BPAM RBC   Collection Time: 10/19/19 11:41 PM  Result Value Ref Range   Blood Product Unit Number [id number]    Unit Type and Rh 0600    Blood Product Expiration Date 937902409735    Blood Product Unit Number [id number]    Unit Type and Rh 0600    Blood Product Expiration Date [id number]     Imaging Studies:     Medications:  Scheduled . prenatal multivitamin  1 tablet Oral Q1200   I have reviewed the patient's current medications.  ASSESSMENT: Patient Active Problem List   Diagnosis Date Noted  . Transient hypertension of pregnancy 10/14/2019  . COVID-19 affecting pregnancy in second trimester 09/25/2019  . Placenta previa 09/09/2019  . History of gestational hypertension 08/22/2019  . Short interval between pregnancies affecting pregnancy, antepartum 08/22/2019  . Late prenatal care 08/22/2019  . BMI 40.0-44.9, adult ([place]) 12/17/2018  . Obesity in pregnancy 12/17/2018  . Supervision of high risk pregnancy, antepartum 12/17/2018  . Rh negative state in antepartum period 12/17/2018    PLAN: Patient with second trimester vaginal bleeding due to placenta previa currently stable s/p BMZ - Continue daily NST - Inpatient observation for 7 days with plans for discharge home on Monday  - Rh neg: s/p rhogam 1/11  Jacobi Ryant 10/20/2019,7:21 AM

## 2019-10-21 ENCOUNTER — Other Ambulatory Visit: Payer: Self-pay | Admitting: *Deleted

## 2019-10-21 MED ORDER — POLYETHYLENE GLYCOL 3350 17 G PO PACK: 17.0000 g | PACK | Freq: Every day | ORAL | 0 refills | Status: DC

## 2019-10-21 MED ORDER — DOCUSATE SODIUM 100 MG PO CAPS: 100.0000 mg | ORAL_CAPSULE | Freq: Two times a day (BID) | ORAL | 0 refills | Status: DC | PRN

## 2019-10-21 NOTE — Discharge Instructions (Signed)
Placenta Previa Placenta previa is a condition in which the placenta implants in the lower part of the uterus in pregnant women. The placenta either partially or completely covers the opening to the cervix. This is a problem because the baby must pass through the cervix during delivery. There are three types of placenta previa:  Marginal placenta previa. The placenta reaches within an inch (2.5 cm) of the cervical opening but does not cover it.  Partial placenta previa. The placenta covers part of the cervical opening.  Complete placenta previa. The placenta covers the entire cervical opening. If the previa is marginal or partial and it is diagnosed in the first half of pregnancy, the placenta may move into a normal position as the pregnancy progresses and may no longer cover the cervix. It is important to keep all prenatal visits with your health care provider so you can be more closely monitored. What are the causes? The cause of this condition is not known. What increases the risk? This condition is more likely to develop in women who:  Are carrying more than one baby (multiples).  Have an abnormally shaped uterus.  Have scars on the lining of the uterus.  Have had surgeries involving the uterus, such as a cesarean delivery.  Have delivered a baby before.  Have a history of placenta previa.  Have smoked or used cocaine during pregnancy.  Are age 35 or older during pregnancy. What are the signs or symptoms? The main symptom of this condition is sudden, painless vaginal bleeding during the second half of pregnancy. The amount of bleeding can be very light at first, and it usually stops on its own. Heavier bleeding episodes may also happen. Some women with placenta previa may have no bleeding at all. How is this diagnosed?  This condition is diagnosed: ? From an ultrasound. This test uses sound waves to find where the placenta is located before you have any bleeding  episodes. ? During a checkup after vaginal bleeding is noticed.  If you are diagnosed with a partial or complete previa, digital exams with fingers will generally be avoided. Your health care provider will still perform a speculum exam.  If you did not have an ultrasound during your pregnancy, placenta previa may not be diagnosed until bleeding occurs during labor. How is this treated? Treatment for this condition may include:  Decreased activity.  Bed rest at home or in the hospital.  Pelvic rest. Nothing is placed inside the vagina during pelvic rest. This means not having sex and not using tampons or douches.  A blood transfusion to replace blood that you have lost (maternal blood loss).  A cesarean delivery. This may be performed if: ? The bleeding is heavy and cannot be controlled. ? The placenta completely covers the cervix.  Medicines to stop premature labor or to help the baby's lungs to mature. This treatment may be used if you need delivery before your pregnancy is full-term. Your treatment will be decided based on:  How much you are bleeding, or whether the bleeding has stopped.  How far along you are in your pregnancy.  The condition of your baby.  The type of placenta previa that you have.  Follow these instructions at home:  Get plenty of rest and lessen activity as told by your health care provider.  Stay on bed rest for as long as told by your health care provider.  Do not have sex, use tampons, use a douche, or place anything inside of   your vagina if your health care provider recommended pelvic rest.  Take over-the-counter and prescription medicines as told by your health care provider.  Keep all follow-up visits as told by your health care provider. This is important. Get help right away if:  You have vaginal bleeding, even if in small amounts and even if you have no pain.  You have cramping or regular contractions.  You have pain in your abdomen or  your lower back.  You have a feeling of increased pressure in your pelvis.  You have increased watery or bloody mucus from the vagina. This information is not intended to replace advice given to you by your health care provider. Make sure you discuss any questions you have with your health care provider. Document Revised: 09/01/2017 Document Reviewed: 04/02/2016 Elsevier Patient Education  2020 [employer]  

## 2019-10-21 NOTE — Discharge Summary (Signed)
Antenatal Physician Discharge Summary  Patient ID: Madison Cook MRN: [id number] DOB/AGE: 23/23/1997 23 y.o.  Admit date: 10/13/2019 Discharge date: 10/21/2019  Admission Diagnoses: IUP 29 4/7 weeks, placenta previa, vaginal bleeding  Discharge Diagnoses: IUP 30 4/7 weeks, placenta previa, undeliveried  Prenatal Procedures: NST  Consults: Neonatology, Maternal Fetal Medicine  Significant Diagnostic Studies:  Results for orders placed or performed during the hospital encounter of 10/13/19 (from the past 168 hour(s))  Rh IG workup (includes ABO/Rh)   Collection Time: 10/14/19  9:51 AM  Result Value Ref Range   Gestational Age(Wks) 25    ABO/RH(D) A NEG    Unit Number [id number]    Blood Component Type RHIG    Unit division 00    Status of Unit ISSUED,FINAL    Transfusion Status      OK TO TRANSFUSE Performed at [facility], [address] [place], [place] [address]   Kleihauer-Betke stain   Collection Time: 10/14/19 10:41 AM  Result Value Ref Range   Fetal Cells % 0 %   Quantitation Fetal Hemoglobin 0 mL   # Vials RhIg 1   Type and screen [facility]   Collection Time: 10/16/19  3:59 PM  Result Value Ref Range   ABO/RH(D) A NEG    Antibody Screen POS    Sample Expiration 10/19/2019,2359    Antibody Identification PASSIVELY ACQUIRED ANTI-D    Unit Number [id number]    Blood Component Type RED CELLS,LR    Unit division 00    Status of Unit REL FROM [facility]    Transfusion Status OK TO TRANSFUSE    Crossmatch Result COMPATIBLE    Unit Number [id number]    Blood Component Type RED CELLS,LR    Unit division 00    Status of Unit REL FROM [facility]    Transfusion Status OK TO TRANSFUSE    Crossmatch Result COMPATIBLE   BPAM RBC   Collection Time: 10/16/19  3:59 PM  Result Value Ref Range   Blood Product Unit Number [id number]    PRODUCT CODE T2458K99    Unit Type and Rh 9500    Blood Product Expiration Date 833825053976    Blood  Product Unit Number [id number]    PRODUCT CODE B3532D92    Unit Type and Rh 9500    Blood Product Expiration Date [id number]   Type and screen [facility]   Collection Time: 10/19/19 11:41 PM  Result Value Ref Range   ABO/RH(D) A NEG    Antibody Screen POS    Sample Expiration 10/22/2019,2359    Antibody Identification      PASSIVELY ACQUIRED ANTI-D Performed at [facility], [address], [place], [place] [address]    Unit Number [id number]    Blood Component Type RED CELLS,LR    Unit division 00    Status of Unit ALLOCATED    Transfusion Status OK TO TRANSFUSE    Crossmatch Result COMPATIBLE    Unit Number [id number]    Blood Component Type RED CELLS,LR    Unit division 00    Status of Unit ALLOCATED    Transfusion Status OK TO TRANSFUSE    Crossmatch Result COMPATIBLE   BPAM RBC   Collection Time: 10/19/19 11:41 PM  Result Value Ref Range   Blood Product Unit Number [id number]    Unit Type and Rh 0600    Blood Product Expiration Date [id number]    Blood Product Unit Number [id number]    Unit  Type and Rh 0600    Blood Product Expiration Date 979892119417     Treatments: IV hydration and steroids: BMZ 1/10 & 11  Hospital Course:  This is a 24 y.o. G3P1011 with IUP at [redacted]w[redacted]d admitted for vaginal bleeding following intercourse in a setting of known placenta previa. Marland Kitchen She was admitted seven days of observation. She  received betamethasone x 2 doses.  She was observed, fetal heart rate monitoring remained reassuring, and she had no signs/symptoms of vaginal bleeding or other maternal-fetal concerns.  Her cervical exam was unchanged from admission.  She was deemed stable for discharge to home with outpatient follow up.  Discharge Exam: BP 137/84 (BP Location: Left Arm)   Pulse (!) 102   Temp 97.7 F (36.5 C) (Oral)   Resp 18   Ht 5\' 3"  (1.6 m)   Wt 108.9 kg   LMP 05/21/2019 (Approximate)   SpO2 99%   BMI 42.51 kg/m  Lungs  clear Heart RRR Abd soft + BS gravid non tender Ext non tender  Discharge Condition: Stable  Disposition: Discharge disposition: 01-Home or Self Care       Discharge Instructions    Discharge diet:  No restrictions   Complete by: As directed    Do not have sex or do anything that might make you have an orgasm   Complete by: As directed    Fetal Kick Count:  Lie on our left side for one hour after a meal, and count the number of times your baby kicks.  If it is less than 5 times, get up, move around and drink some juice.  Repeat the test 30 minutes later.  If it is still less than 5 kicks in an hour, notify your doctor.   Complete by: As directed    Notify physician for a general feeling that "something is not right"   Complete by: As directed    Notify physician for increase or change in vaginal discharge   Complete by: As directed    Notify physician for intestinal cramps, with or without diarrhea, sometimes described as "gas pain"   Complete by: As directed    Notify physician for leaking of fluid   Complete by: As directed    Notify physician for low, dull backache, unrelieved by heat or Tylenol   Complete by: As directed    Notify physician for menstrual like cramps   Complete by: As directed    Notify physician for pelvic pressure   Complete by: As directed    Notify physician for uterine contractions.  These may be painless and feel like the uterus is tightening or the baby is  "balling up"   Complete by: As directed    Notify physician for vaginal bleeding   Complete by: As directed    PRETERM LABOR:  Includes any of the follwing symptoms that occur between 20 - [redacted] weeks gestation.  If these symptoms are not stopped, preterm labor can result in preterm delivery, placing your baby at risk   Complete by: As directed      Allergies as of 10/21/2019   No Known Allergies     Medication List    STOP taking these medications   benzonatate 100 MG capsule Commonly known  as: Tessalon Perles     TAKE these medications   acetaminophen 325 MG tablet Commonly known as: TYLENOL Take 650 mg by mouth every 6 (six) hours as needed for mild pain or headache.   albuterol 108 (90  Base) MCG/ACT inhaler Commonly known as: VENTOLIN HFA Inhale 2 puffs into the lungs every 6 (six) hours as needed for wheezing or shortness of breath.   aspirin EC 81 MG tablet Take 1 tablet (81 mg total) by mouth daily.   docusate sodium 100 MG capsule Commonly known as: COLACE Take 1 capsule (100 mg total) by mouth 2 (two) times daily as needed for mild constipation.   PrePLUS 27-1 MG Tabs Take 1 tablet by mouth daily.      Follow-up Information    [facility] Follow up.   Specialty: Obstetrics and Gynecology Why: Pt already has appt this Wednesday Contact information: [address] [place] [address] [phone]          Signed: Hermina Staggers M.D. 10/21/2019, 9:43 AM

## 2019-10-22 LAB — BPAM RBC
Blood Product Expiration Date: 202102222359
Blood Product Expiration Date: 202102222359
Unit Type and Rh: 600
Unit Type and Rh: 600

## 2019-10-22 LAB — TYPE AND SCREEN
ABO/RH(D): A NEG
Antibody Screen: POSITIVE
Unit division: 0
Unit division: 0

## 2019-10-23 ENCOUNTER — Telehealth (INDEPENDENT_AMBULATORY_CARE_PROVIDER_SITE_OTHER): Payer: Commercial Managed Care - PPO | Admitting: Advanced Practice Midwife

## 2019-10-23 ENCOUNTER — Other Ambulatory Visit: Payer: Self-pay

## 2019-10-23 VITALS — BP 138/98 | HR 72

## 2019-10-23 DIAGNOSIS — Z8759 Personal history of other complications of pregnancy, childbirth and the puerperium: Secondary | ICD-10-CM

## 2019-10-23 DIAGNOSIS — U071 COVID-19: Secondary | ICD-10-CM

## 2019-10-23 DIAGNOSIS — O98512 Other viral diseases complicating pregnancy, second trimester: Secondary | ICD-10-CM

## 2019-10-23 DIAGNOSIS — O26899 Other specified pregnancy related conditions, unspecified trimester: Secondary | ICD-10-CM

## 2019-10-23 DIAGNOSIS — Z3A26 26 weeks gestation of pregnancy: Secondary | ICD-10-CM

## 2019-10-23 DIAGNOSIS — Z6791 Unspecified blood type, Rh negative: Secondary | ICD-10-CM

## 2019-10-23 DIAGNOSIS — O0992 Supervision of high risk pregnancy, unspecified, second trimester: Secondary | ICD-10-CM

## 2019-10-23 DIAGNOSIS — O0990 Supervision of high risk pregnancy, unspecified, unspecified trimester: Secondary | ICD-10-CM

## 2019-10-23 DIAGNOSIS — O360120 Maternal care for anti-D [Rh] antibodies, second trimester, not applicable or unspecified: Secondary | ICD-10-CM

## 2019-10-23 DIAGNOSIS — O4402 Placenta previa specified as without hemorrhage, second trimester: Secondary | ICD-10-CM

## 2019-10-23 NOTE — Progress Notes (Signed)
TELEHEALTH OBSTETRICS PRENATAL VIRTUAL VIDEO VISIT ENCOUNTER NOTE  Provider location: [facility]   I connected with Madison Cook on 10/23/19 at  1:45 PM EST by MyChart Video Encounter at home and verified that I am speaking with the correct person using two identifiers.   I discussed the limitations, risks, security and privacy concerns of performing an evaluation and management service virtually and the availability of in person appointments. I also discussed with the patient that there may be a patient responsible charge related to this service. The patient expressed understanding and agreed to proceed. Subjective:  Madison Cook is a 24 y.o. G3P1011 at [redacted]w[redacted]d being seen today for ongoing prenatal care.  She is currently monitored for the following issues for this high-risk pregnancy and has BMI 40.0-44.9, adult (HCC); Obesity in pregnancy; Supervision of high risk pregnancy, antepartum; Rh negative state in antepartum period; History of gestational hypertension; Short interval between pregnancies affecting pregnancy, antepartum; Late prenatal care; Placenta previa; COVID-19 affecting pregnancy in second trimester; and Transient hypertension of pregnancy on their problem list.  Patient reports abdominal cramping when she rides in the car. Cramps are mild and resolve after a few short minutes of rest.  Contractions: Not present.  .  Movement: Present. Denies any leaking of fluid.   The following portions of the patient's history were reviewed and updated as appropriate: allergies, current medications, past family history, past medical history, past social history, past surgical history and problem list.   Objective:   Vitals:   10/23/19 1341  BP: (!) 138/98  Pulse: 72    Fetal Status:     Movement: Present     General:  Alert, oriented and cooperative. Patient is in no acute distress.  Respiratory: Normal respiratory effort, no problems with  respiration noted  Mental Status: Normal mood and affect. Normal behavior. Normal judgment and thought content.  Rest of physical exam deferred due to type of encounter  Imaging: [place] MFM OB Transvaginal  Result Date: 10/14/2019 ----------------------------------------------------------------------  OBSTETRICS REPORT                       (Signed Final 10/14/2019 08:56 pm) ---------------------------------------------------------------------- Patient Info  ID #:       [id number]                          D.O.B.:  01-May-1996 (24 yrs)  Name:       Madison Cook                Visit Date: 10/14/2019 12:49 pm ---------------------------------------------------------------------- Performed By  Performed By:     Tomma Lightning             Ref. Address:      [address]                    [address]                                                              [address]  Attending:        Lin Landsman      [facility]  Referred By:      [facility] [place] [place] ---------------------------------------------------------------------- Orders   #  Description                          Code         Ordered By   1  US MFM OB FOLLOW UP                  [id number]     [place] BingCHARLIE PICKENS   2  US MFM OB TRANSVAGINAL               [id number]      CHARLIE PICKENS  ----------------------------------------------------------------------   #  Order #                    Accession #                 Episode #   1  [id number]                  [id number]   2  [id number]                  [id number]                  [id number]  ---------------------------------------------------------------------- Indications   Placenta previa specified as without           O44.02   hemorrhage, second trimester   [redacted] weeks gestation of pregnancy                Z3A.24   Obesity complicating pregnancy, second         O99.212   trimester   Short interval between  pregancies, 2nd         O09.892   trimester   Encounter for other antenatal screening        Z36.2   follow-up  ---------------------------------------------------------------------- Fetal Evaluation  Num Of Fetuses:          1  Fetal Heart Rate(bpm):   137  Cardiac Activity:        Observed  Presentation:            Transverse, head to maternal left  Placenta:                Central previa  P. Cord Insertion:       Marginal insertion prev seen  Amniotic Fluid  AFI FV:      Within normal limits                              Largest Pocket(cm)                              7.62 ---------------------------------------------------------------------- Biometry  BPD:      59.6  mm     G. Age:  24w 2d         44  %    CI:        77.12   %  70 - 86                                                          FL/HC:       19.3  %    18.7 - 20.9  HC:      214.9  mm     G. Age:  23w 4d         10  %    HC/AC:       1.08       1.05 - 1.21  AC:      199.5  mm     G. Age:  24w 4d         51  %    FL/BPD:      69.5  %    71 - 87  FL:       41.4  mm     G. Age:  23w 3d         15  %    FL/AC:       20.8  %    20 - 24  HUM:      38.3  mm     G. Age:  23w 4d         24  %  LV:        3.9  mm  Est. FW:     656   gm     1 lb 7 oz     31  % ---------------------------------------------------------------------- OB History  Gravidity:    3         Term:   1        Prem:   0        SAB:   1  TOP:          0       Ectopic:  0        Living: 1 ---------------------------------------------------------------------- Gestational Age  LMP:           20w 6d        Date:  05/21/19                 EDD:   02/25/20  U/S Today:     24w 0d                                        EDD:   02/03/20  Best:          24w 2d     Det. By:  U/S  (09/04/19)          EDD:   02/01/20 ---------------------------------------------------------------------- Anatomy  Cranium:               Appears normal         LVOT:                   Appears normal  Cavum:                  Previously seen        Aortic Arch:            Not well visualized  Ventricles:  Appears normal         Ductal Arch:            Not well visualized  Choroid Plexus:        Previously seen        Diaphragm:              Previously seen  Cerebellum:            Previously seen        Stomach:                Appears normal, left                                                                        sided  Posterior Fossa:       Previously seen        Abdomen:                Appears normal  Nuchal Fold:           Previously seen        Abdominal Wall:         Previously seen  Face:                  Orbits and profile     Cord Vessels:           Previously seen                         previously seen  Lips:                  Previously seen        Kidneys:                Appear normal  Palate:                Not well visualized    Bladder:                Appears normal  Thoracic:              Appears normal         Spine:                  Previously seen  Heart:                 Appears normal         Upper Extremities:      Previously seen                         (4CH, axis, and                         situs)  RVOT:                  Appears normal         Lower Extremities:      Previously seen  Other:  Female gender prev seen.  Nasal bone visualized. Heels and 5th digit          prev visualized. Technically difficult due to maternal  habitus and fetal          position. ---------------------------------------------------------------------- Cervix Uterus Adnexa  Cervix  Length:           4.03  cm.  Normal appearance by transvaginal scan ---------------------------------------------------------------------- Impression  Known placenta previa  Normal interval growth  Good fetal movement and amniotic fluid  Marginal cord insertion not seen today. ---------------------------------------------------------------------- Recommendations  Repeat growth in 4 weeks  Consider delivery by 35-36 weeks  ----------------------------------------------------------------------               Lin Landsmanorenthian Booker, MD Electronically Signed Final Report   10/14/2019 08:56 pm ----------------------------------------------------------------------  US MFM OB FOLLOW UP  Result Date: 10/14/2019 ----------------------------------------------------------------------  OBSTETRICS REPORT                       (Signed Final 10/14/2019 08:56 pm) ---------------------------------------------------------------------- Patient Info  ID #:       [id number]                          D.O.B.:  07/17/1996 (23 yrs)  Name:       Madison RedheadYNTHIA Erway                Visit Date: 10/14/2019 12:49 pm ---------------------------------------------------------------------- Performed By  Performed By:     Tomma Lightningevin Vics             Ref. Address[address]      [address]                    [address]                                                              [address]  Attending:        Lin Landsmanorenthian Booker      [facility]  [facility] By:      [facility] [place] ---------------------------------------------------------------------- Orders   #  Description                          Code         Ordered By   1  US MFM OB FOLLOW UP                  [id number]     [place] BingCHARLIE PICKENS   2  US MFM OB TRANSVAGINAL               [id number]      CHARLIE PICKENS  ----------------------------------------------------------------------   #  Order #                    Accession #                 Episode #   1  [id number]  [id number]                  [id number]   2  [id number]                  [id number]                  [id number]  ---------------------------------------------------------------------- Indications   Placenta previa specified as without           O44.02   hemorrhage, second trimester   [redacted] weeks gestation of pregnancy                Z3A.24   Obesity complicating pregnancy, second          O99.212   trimester   Short interval between pregancies, 2nd         O09.892   trimester   Encounter for other antenatal screening        Z36.2   follow-up  ---------------------------------------------------------------------- Fetal Evaluation  Num Of Fetuses:          1  Fetal Heart Rate(bpm):   137  Cardiac Activity:        Observed  Presentation:            Transverse, head to maternal left  Placenta:                Central previa  P. Cord Insertion:       Marginal insertion prev seen  Amniotic Fluid  AFI FV:      Within normal limits                              Largest Pocket(cm)                              7.62 ---------------------------------------------------------------------- Biometry  BPD:      59.6  mm     G. Age:  24w 2d         44  %    CI:        77.12   %    70 - 86                                                          FL/HC:       19.3  %    18.7 - 20.9  HC:      214.9  mm     G. Age:  23w 4d         10  %    HC/AC:       1.08       1.05 - 1.21  AC:      199.5  mm     G. Age:  24w 4d         51  %    FL/BPD:      69.5  %    71 - 87  FL:       41.4  mm     G. Age:  23w 3d         15  %    FL/AC:       20.8  %    20 -  24  HUM:      38.3  mm     G. Age:  23w 4d         24  %  LV:        3.9  mm  Est. FW:     656   gm     1 lb 7 oz     31  % ---------------------------------------------------------------------- OB History  Gravidity:    3         Term:   1        Prem:   0        SAB:   1  TOP:          0       Ectopic:  0        Living: 1 ---------------------------------------------------------------------- Gestational Age  LMP:           20w 6d        Date:  05/21/19                 EDD:   02/25/20  U/S Today:     24w 0d                                        EDD:   02/03/20  Best:          24w 2d     Det. By:  U/S  (09/04/19)          EDD:   02/01/20 ---------------------------------------------------------------------- Anatomy  Cranium:               Appears normal         LVOT:                    Appears normal  Cavum:                 Previously seen        Aortic Arch:            Not well visualized  Ventricles:            Appears normal         Ductal Arch:            Not well visualized  Choroid Plexus:        Previously seen        Diaphragm:              Previously seen  Cerebellum:            Previously seen        Stomach:                Appears normal, left                                                                        sided  Posterior Fossa:       Previously seen        Abdomen:                Appears normal  Nuchal Fold:  Previously seen        Abdominal Wall:         Previously seen  Face:                  Orbits and profile     Cord Vessels:           Previously seen                         previously seen  Lips:                  Previously seen        Kidneys:                Appear normal  Palate:                Not well visualized    Bladder:                Appears normal  Thoracic:              Appears normal         Spine:                  Previously seen  Heart:                 Appears normal         Upper Extremities:      Previously seen                         (4CH, axis, and                         situs)  RVOT:                  Appears normal         Lower Extremities:      Previously seen  Other:  Female gender prev seen.  Nasal bone visualized. Heels and 5th digit          prev visualized. Technically difficult due to maternal habitus and fetal          position. ---------------------------------------------------------------------- Cervix Uterus Adnexa  Cervix  Length:           4.03  cm.  Normal appearance by transvaginal scan ---------------------------------------------------------------------- Impression  Known placenta previa  Normal interval growth  Good fetal movement and amniotic fluid  Marginal cord insertion not seen today. ---------------------------------------------------------------------- Recommendations  Repeat growth in 4 weeks  Consider  delivery by 35-36 weeks ----------------------------------------------------------------------               Lin Landsman, MD Electronically Signed Final Report   10/14/2019 08:56 pm ----------------------------------------------------------------------   Assessment and Plan:  Pregnancy: G3P1011 at [redacted]w[redacted]d 1. Supervision of high risk pregnancy, antepartum - Stable, continue surveillance as previously coordinated  2. Placenta Previa - S/p OBSC admission for bleeding event following coitus - No bleeding   3. COVID-19 affecting pregnancy in second trimester  4. Transient gestational hypertension --BP on note is from patient's home cuff last night. She is currently running errands and away from cuff. Agrees to recheck when she gets home and is at rest. Send via [employer] as [employer] app has never worked for her. Come to office for BP check if elevated - P:Cr 0.28 on 10/14/2019  5. Rh negative state in antepartum period --  S/p Rhogam 10/14/2019  Preterm labor symptoms and general obstetric precautions including but not limited to vaginal bleeding, contractions, leaking of fluid and fetal movement were reviewed in detail with the patient. I discussed the assessment and treatment plan with the patient. The patient was provided an opportunity to ask questions and all were answered. The patient agreed with the plan and demonstrated an understanding of the instructions. The patient was advised to call back or seek an in-person office evaluation/go to [facility] at [facility] for any urgent or concerning symptoms. Please refer to After Visit Summary for other counseling recommendations.   I provided twelve minutes of face-to-face time during this encounter.  Future Appointments  Date Time Provider Department Center  11/05/2019  8:15 AM Reva Bores, MD [facility]    Calvert Cantor, CNM [facility] [employer], [facility]

## 2019-10-23 NOTE — Progress Notes (Signed)
I connected with  Tonna Palazzi on 10/23/19 at  1:45 PM EST by telephone and verified that I am speaking with the correct person using two identifiers.   I discussed the limitations, risks, security and privacy concerns of performing an evaluation and management service by telephone and the availability of in person appointments. I also discussed with the patient that there may be a patient responsible charge related to this service. The patient expressed understanding and agreed to proceed.  Keshonna Valvo Emeline Darling, CMA 10/23/2019  1:42 PM

## 2019-11-05 ENCOUNTER — Other Ambulatory Visit: Payer: Self-pay

## 2019-11-05 ENCOUNTER — Ambulatory Visit (INDEPENDENT_AMBULATORY_CARE_PROVIDER_SITE_OTHER): Payer: Commercial Managed Care - PPO | Admitting: Family Medicine

## 2019-11-05 ENCOUNTER — Encounter: Payer: Self-pay | Admitting: Family Medicine

## 2019-11-05 VITALS — BP 119/83 | HR 91 | Wt 238.0 lb

## 2019-11-05 DIAGNOSIS — O26899 Other specified pregnancy related conditions, unspecified trimester: Secondary | ICD-10-CM

## 2019-11-05 DIAGNOSIS — Z23 Encounter for immunization: Secondary | ICD-10-CM

## 2019-11-05 DIAGNOSIS — Z6791 Unspecified blood type, Rh negative: Secondary | ICD-10-CM

## 2019-11-05 DIAGNOSIS — O0990 Supervision of high risk pregnancy, unspecified, unspecified trimester: Secondary | ICD-10-CM

## 2019-11-05 DIAGNOSIS — O0993 Supervision of high risk pregnancy, unspecified, third trimester: Secondary | ICD-10-CM

## 2019-11-05 DIAGNOSIS — O26893 Other specified pregnancy related conditions, third trimester: Secondary | ICD-10-CM

## 2019-11-05 DIAGNOSIS — D699 Hemorrhagic condition, unspecified: Secondary | ICD-10-CM

## 2019-11-05 DIAGNOSIS — Z3A28 28 weeks gestation of pregnancy: Secondary | ICD-10-CM

## 2019-11-05 DIAGNOSIS — O4403 Placenta previa specified as without hemorrhage, third trimester: Secondary | ICD-10-CM

## 2019-11-05 NOTE — Patient Instructions (Signed)

## 2019-11-05 NOTE — Progress Notes (Signed)
Pt declined tdap

## 2019-11-05 NOTE — Progress Notes (Signed)
   PRENATAL VISIT NOTE  Subjective:  Madison Cook is a 24 y.o. G3P1011 at [redacted]w[redacted]d being seen today for ongoing prenatal care.  She is currently monitored for the following issues for this high-risk pregnancy and has BMI 40.0-44.9, adult (HCC); Obesity in pregnancy; Supervision of high risk pregnancy, antepartum; Rh negative state in antepartum period; History of gestational hypertension; Short interval between pregnancies affecting pregnancy, antepartum; Late prenatal care; Placenta previa; COVID-19 affecting pregnancy in second trimester; and Transient hypertension of pregnancy on their problem list.  Patient reports no complaints.  Contractions: Not present. Vag. Bleeding: None.  Movement: Present. Denies leaking of fluid.   The following portions of the patient's history were reviewed and updated as appropriate: allergies, current medications, past family history, past medical history, past social history, past surgical history and problem list.   Objective:   Vitals:   11/05/19 0824  BP: 119/83  Pulse: 91  Weight: 238 lb (108 kg)    Fetal Status: Fetal Heart Rate (bpm): 141 Fundal Height: 30 cm Movement: Present     General:  Alert, oriented and cooperative. Patient is in no acute distress.  Skin: Skin is warm and dry. No rash noted.   Cardiovascular: Normal heart rate noted  Respiratory: Normal respiratory effort, no problems with respiration noted  Abdomen: Soft, gravid, appropriate for gestational age.  Pain/Pressure: Present     Pelvic: Cervical exam deferred        Extremities: Normal range of motion.  Edema: None  Mental Status: Normal mood and affect. Normal behavior. Normal judgment and thought content.   Assessment and Plan:  Pregnancy: G3P1011 at [redacted]w[redacted]d 1. Supervision of high risk pregnancy, antepartum 28 wk labs today and TDaP - Glucose Tolerance, 2 Hours w/1 Hour - RPR - CBC - HIV Antibody (routine testing w rflx)  2. Rh negative state in antepartum period S/p  Rhogam 10/14/19  3. Placenta previa in third trimester S/p hospitalization x 1 for 1st bleed. Advised of continued hospitalization if bleeds again. Risks of previa discussed. Booked for primary C-section at 36 wks, if makes it that far.  4. 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 Does not have a diagnosis for him, but will get it. - Protime-INR - PTT - Von Willebrand panel  Preterm labor symptoms and general obstetric precautions including but not limited to vaginal bleeding, contractions, leaking of fluid and fetal movement were reviewed in detail with the patient. Please refer to After Visit Summary for other counseling recommendations.   Return in 2 weeks (on 11/19/2019) for virtual.  Future Appointments  Date Time Provider Department Center  11/06/2019  3:30 PM [facility] NURSE [facility]  11/06/2019  3:30 PM [facility] [place] 1 [facility]-US  11/19/2019  3:30 PM Anyanwu, Jethro Bastos, MD [facility]    Reva Bores, MD

## 2019-11-06 ENCOUNTER — Ambulatory Visit (HOSPITAL_COMMUNITY): Payer: Commercial Managed Care - PPO | Admitting: *Deleted

## 2019-11-06 ENCOUNTER — Ambulatory Visit (HOSPITAL_COMMUNITY)
Admission: RE | Admit: 2019-11-06 | Discharge: 2019-11-06 | Disposition: A | Discharge status: Home / Self Care | Payer: Commercial Managed Care - PPO | Source: Ambulatory Visit | Attending: Maternal & Fetal Medicine | Admitting: Maternal & Fetal Medicine

## 2019-11-06 ENCOUNTER — Other Ambulatory Visit: Payer: Self-pay

## 2019-11-06 ENCOUNTER — Encounter (HOSPITAL_COMMUNITY): Payer: Self-pay

## 2019-11-06 DIAGNOSIS — O98512 Other viral diseases complicating pregnancy, second trimester: Secondary | ICD-10-CM | POA: Diagnosis present

## 2019-11-06 DIAGNOSIS — Z3A27 27 weeks gestation of pregnancy: Secondary | ICD-10-CM

## 2019-11-06 DIAGNOSIS — O99212 Obesity complicating pregnancy, second trimester: Secondary | ICD-10-CM | POA: Diagnosis not present

## 2019-11-06 DIAGNOSIS — U071 COVID-19: Secondary | ICD-10-CM | POA: Insufficient documentation

## 2019-11-06 DIAGNOSIS — Z362 Encounter for other antenatal screening follow-up: Secondary | ICD-10-CM | POA: Diagnosis present

## 2019-11-06 DIAGNOSIS — O09892 Supervision of other high risk pregnancies, second trimester: Secondary | ICD-10-CM | POA: Diagnosis not present

## 2019-11-06 DIAGNOSIS — O4402 Placenta previa specified as without hemorrhage, second trimester: Secondary | ICD-10-CM

## 2019-11-06 LAB — RPR: RPR Ser Ql: NONREACTIVE

## 2019-11-06 LAB — CBC
Hematocrit: 35.4 % (ref 34.0–46.6)
Hemoglobin: 11.4 g/dL (ref 11.1–15.9)
MCH: 29.5 pg (ref 26.6–33.0)
MCHC: 32.2 g/dL (ref 31.5–35.7)
MCV: 92 fL (ref 79–97)
Platelets: 247 10*3/uL (ref 150–450)
RBC: 3.87 x10E6/uL (ref 3.77–5.28)
RDW: 13.5 % (ref 11.7–15.4)
WBC: 14.3 10*3/uL — ABNORMAL HIGH (ref 3.4–10.8)

## 2019-11-06 LAB — GLUCOSE TOLERANCE, 2 HOURS W/ 1HR
Glucose, 1 hour: 143 mg/dL (ref 65–179)
Glucose, 2 hour: 97 mg/dL (ref 65–152)
Glucose, Fasting: 80 mg/dL (ref 65–91)

## 2019-11-06 LAB — HIV ANTIBODY (ROUTINE TESTING W REFLEX): HIV Screen 4th Generation wRfx: NONREACTIVE

## 2019-11-06 IMAGING — US US MFM OB FOLLOW-UP
2 series · 13 of 28 positions shown · non-contrast
Comparison: none

[Series 1: us mfm ob follow-up · 30 acquisitions, 12 frames shown (1 of 2)]
[im 2/30]
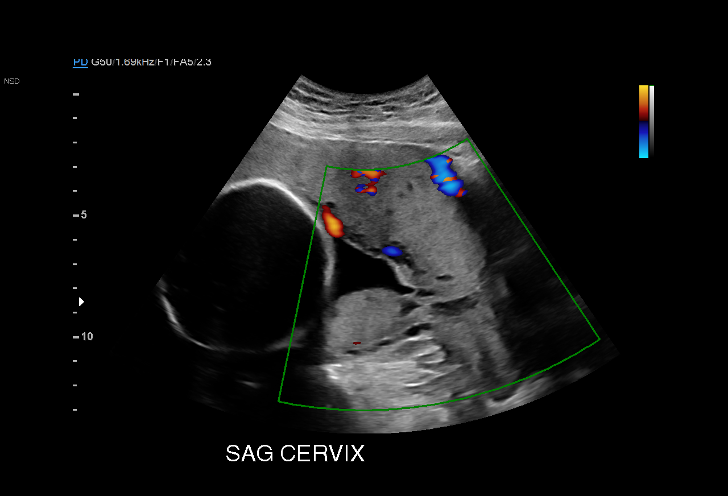
[im 4/30]
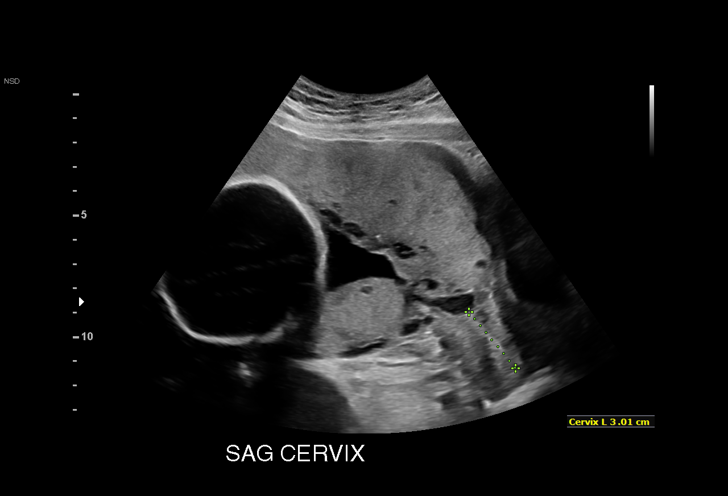
[im 7/30]
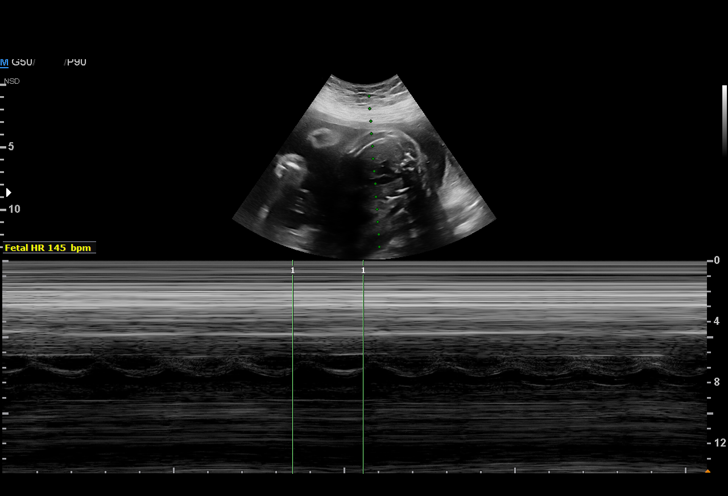
[im 9/30]
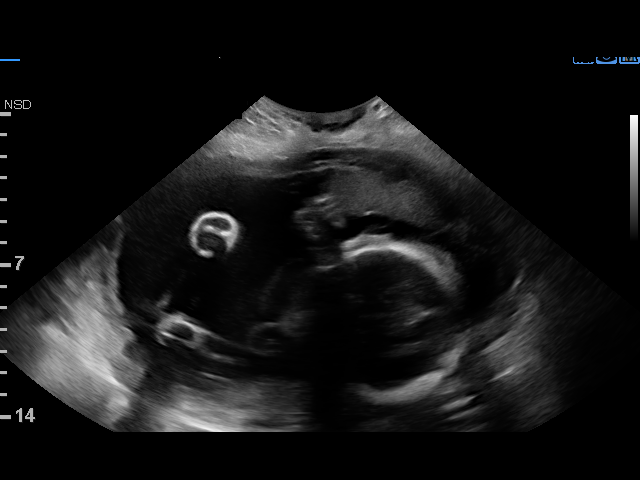
[im 11/30]
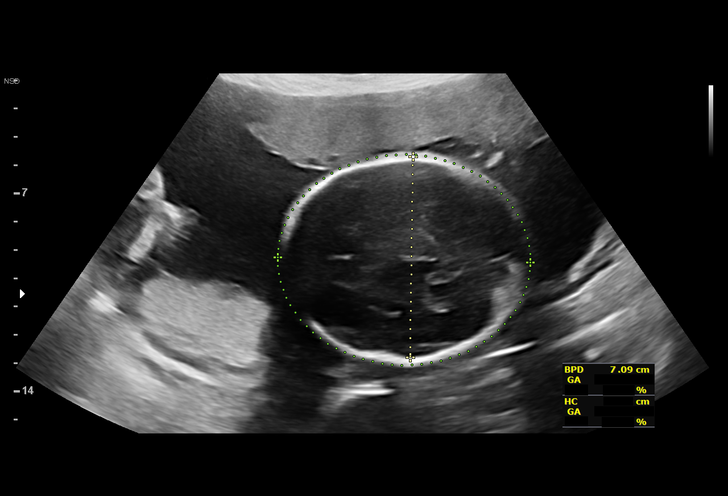
[im 14/30]
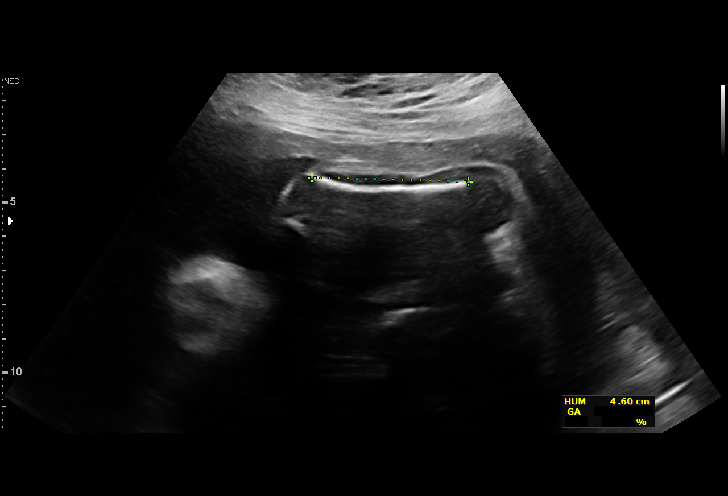
[im 17/30]
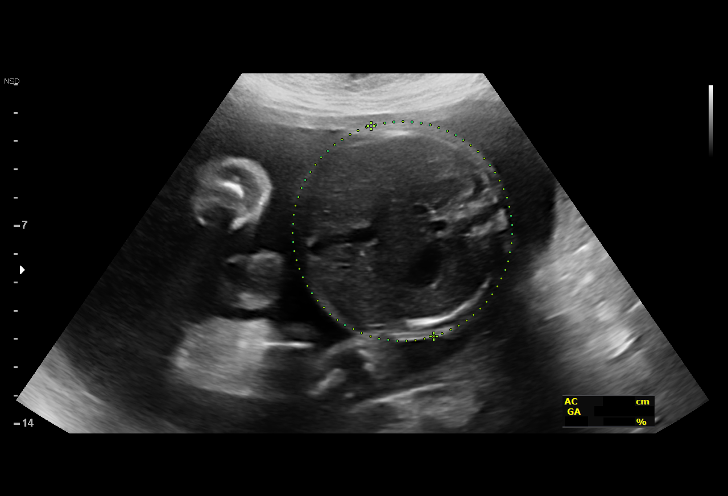
[im 20/30]
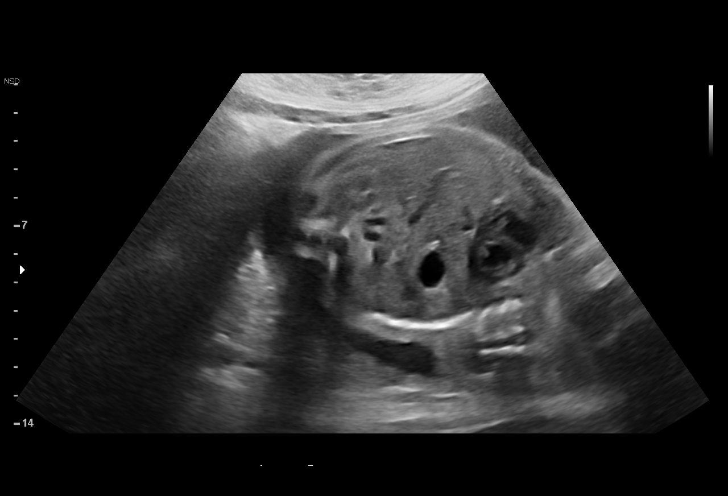
[im 22/30]
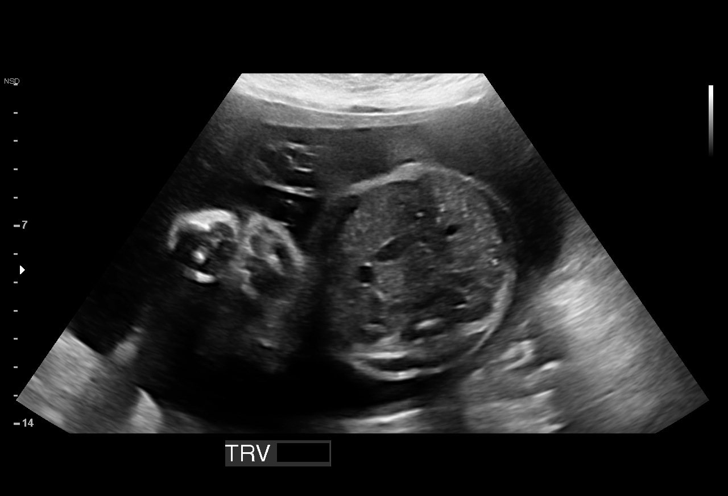
[im 25/30]
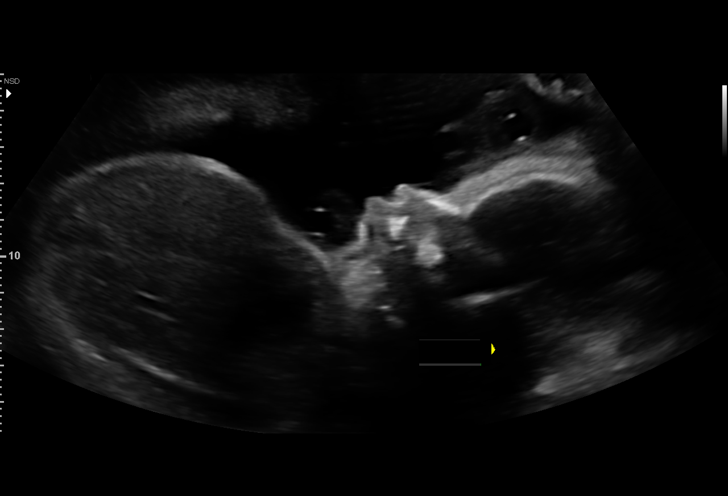
[im 27/30]
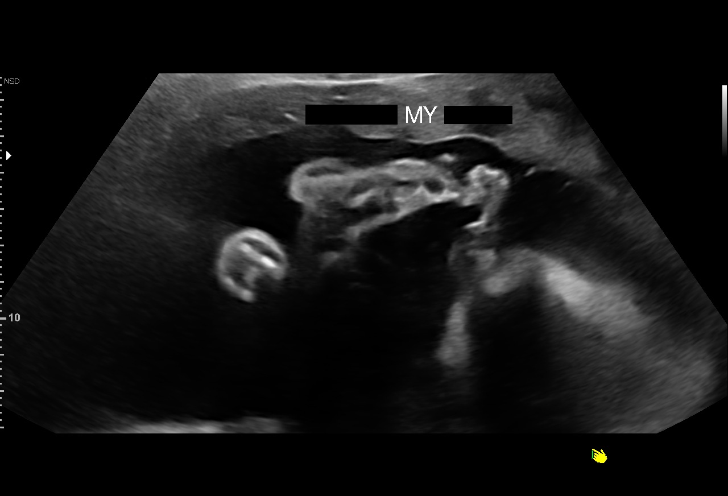
[im 30/30]
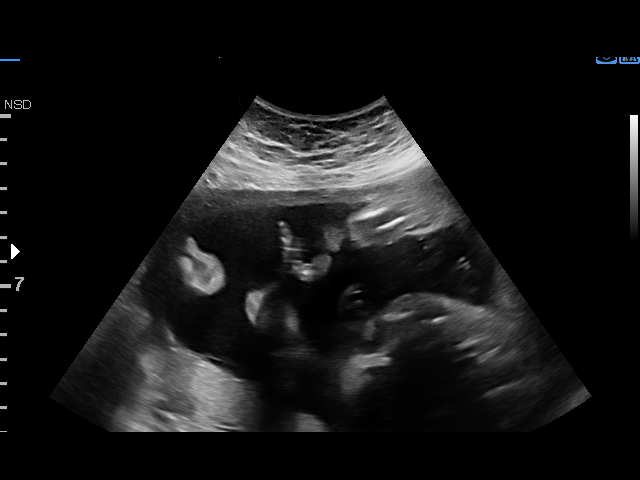

[Series 3: us mfm ob follow-up · 1 of 4 slices shown (2 of 2)]
[im 2/4]
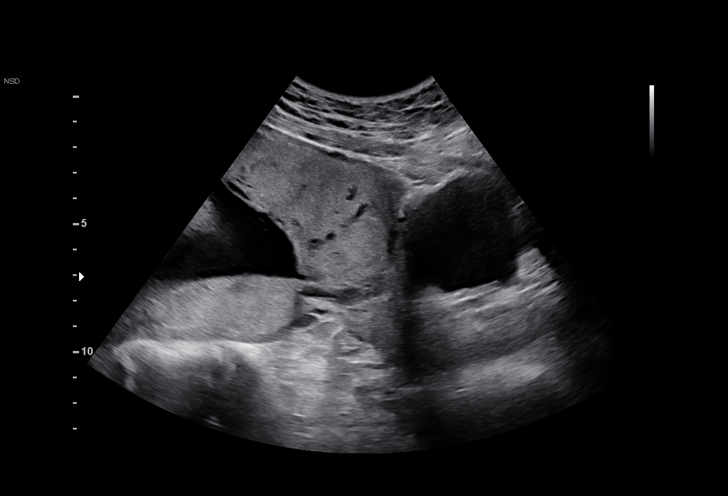

[13 of 28 positions shown; findings below may reference images not displayed]

JINBO
 ----------------------------------------------------------------------

 ----------------------------------------------------------------------
Indications

  Placenta previa specified as without           [SP]
  hemorrhage, second trimester
  27 weeks gestation of pregnancy
  Obesity complicating pregnancy, second         [SP]
  trimester
  Short interval between pregancies, 2nd         [SP]
  trimester
  Encounter for other antenatal screening        [SP]
  follow-up
 ----------------------------------------------------------------------
Fetal Evaluation

 Num Of Fetuses:         1
 Fetal Heart Rate(bpm):  145
 Cardiac Activity:       Observed
 Presentation:           Cephalic
 Placenta:               Central previa
 P. Cord Insertion:      Marginal insertion

 Amniotic Fluid
 AFI FV:      Within normal limits

 AFI Sum(cm)     %Tile       Largest Pocket(cm)
 23.8            96

 RUQ(cm)       RLQ(cm)       LUQ(cm)        LLQ(cm)

Biometry

 BPD:      71.6  mm     G. Age:  28w 5d         76  %    CI:        74.95   %    70 - 86
                                                         FL/HC:      19.1   %    18.8 -
 HC:      262.4  mm     G. Age:  28w 4d         52  %    HC/AC:      1.07        1.05 -
 AC:      244.7  mm     G. Age:  28w 5d         76  %    FL/BPD:     70.1   %    71 - 87
 FL:       50.2  mm     G. Age:  27w 0d         19  %    FL/AC:      20.5   %    20 - 24
 HUM:      45.5  mm     G. Age:  26w 6d         32  %

 LV:        4.9  mm

 Est. FW:    [SP]  gm    2 lb 10 oz      60  %
OB History

 Gravidity:    3         Term:   1        Prem:   0        SAB:   1
 TOP:          0       Ectopic:  0        Living: 1
Gestational Age

 LMP:           24w 1d        Date:  [DATE]                 EDD:   [DATE]
 U/S Today:     28w 2d                                        EDD:   [DATE]
 Best:          27w 4d     Det. By:  U/S  ([DATE])          EDD:   [DATE]
Anatomy

 Cranium:               Appears normal         LVOT:                   Previously seen
 Cavum:                 Previously seen        Aortic Arch:            Not well visualized
 Ventricles:            Appears normal         Ductal Arch:            Not well visualized
 Choroid Plexus:        Previously seen        Diaphragm:              Appears normal
 Cerebellum:            Previously seen        Stomach:                Appears normal, left
                                                                       sided
 Posterior Fossa:       Previously seen        Abdomen:                Appears normal
 Nuchal Fold:           Previously seen        Abdominal Wall:         Previously seen
 Face:                  Orbits and profile     Cord Vessels:           Previously seen
                        previously seen
 Lips:                  Previously seen        Kidneys:                Appear normal
 Palate:                Not well visualized    Bladder:                Appears normal
 Thoracic:              Appears normal         Spine:                  Previously seen
 Heart:                 Appears normal         Upper Extremities:      Previously seen
                        (4CH, axis, and
                        situs)
 RVOT:                  Previously seen        Lower Extremities:      Previously seen

 Other:  Female gender prev seen.  Nasal bone, Heels and 5th digit prev
         visualized. Technically difficult due to maternal habitus and fetal
         position.
Cervix Uterus Adnexa

 Cervix
 Length:           3.01  cm.
 Normal appearance by transabdominal scan.
Comments

 The patient has been followed for placenta previa. She
 denies any problems since her last visit.
 The fetal growth and amniotic fluid level were appropriate for
 her gestational age
 A placenta previa continues to be noted on today's exam.
 Precautions due to the placenta previa were discussed.
 A follow-up exam was scheduled in 6 weeks assess the
 placental location.
 Should a placenta previa be noted during her next exam, a
 cesarean delivery will recommended at around 37 weeks.

## 2019-11-07 ENCOUNTER — Other Ambulatory Visit (HOSPITAL_COMMUNITY): Payer: Self-pay | Admitting: *Deleted

## 2019-11-07 DIAGNOSIS — O4403 Placenta previa specified as without hemorrhage, third trimester: Secondary | ICD-10-CM

## 2019-11-11 LAB — PROTIME-INR
INR: 0.9 (ref 0.9–1.2)
Prothrombin Time: 10 s (ref 9.1–12.0)

## 2019-11-11 LAB — COAG STUDIES INTERP REPORT

## 2019-11-11 LAB — VON WILLEBRAND PANEL
Factor VIII Activity: 215 % — ABNORMAL HIGH (ref 56–140)
Von Willebrand Ag: 167 % (ref 50–200)
Von Willebrand Factor: 148 % (ref 50–200)

## 2019-11-11 LAB — APTT: aPTT: 24 s (ref 24–33)

## 2019-11-17 ENCOUNTER — Other Ambulatory Visit: Payer: Self-pay

## 2019-11-17 ENCOUNTER — Encounter (HOSPITAL_COMMUNITY): Payer: Self-pay | Admitting: Obstetrics and Gynecology

## 2019-11-17 ENCOUNTER — Inpatient Hospital Stay (HOSPITAL_COMMUNITY)
Admission: AD | Admit: 2019-11-17 | Discharge: 2019-12-01 | DRG: 833 | Disposition: A | Discharge status: Home / Self Care | Payer: Commercial Managed Care - PPO | Attending: Obstetrics and Gynecology | Admitting: Obstetrics and Gynecology

## 2019-11-17 DIAGNOSIS — Z3A30 30 weeks gestation of pregnancy: Secondary | ICD-10-CM | POA: Diagnosis not present

## 2019-11-17 DIAGNOSIS — Z20822 Contact with and (suspected) exposure to covid-19: Secondary | ICD-10-CM | POA: Diagnosis present

## 2019-11-17 DIAGNOSIS — O133 Gestational [pregnancy-induced] hypertension without significant proteinuria, third trimester: Secondary | ICD-10-CM | POA: Diagnosis present

## 2019-11-17 DIAGNOSIS — O4413 Placenta previa with hemorrhage, third trimester: Secondary | ICD-10-CM | POA: Diagnosis present

## 2019-11-17 DIAGNOSIS — O4400 Placenta previa specified as without hemorrhage, unspecified trimester: Secondary | ICD-10-CM

## 2019-11-17 DIAGNOSIS — O99213 Obesity complicating pregnancy, third trimester: Secondary | ICD-10-CM | POA: Diagnosis present

## 2019-11-17 DIAGNOSIS — E669 Obesity, unspecified: Secondary | ICD-10-CM | POA: Diagnosis present

## 2019-11-17 DIAGNOSIS — O99019 Anemia complicating pregnancy, unspecified trimester: Secondary | ICD-10-CM | POA: Diagnosis not present

## 2019-11-17 DIAGNOSIS — Z3A31 31 weeks gestation of pregnancy: Secondary | ICD-10-CM | POA: Diagnosis not present

## 2019-11-17 DIAGNOSIS — Z3A32 32 weeks gestation of pregnancy: Secondary | ICD-10-CM | POA: Diagnosis not present

## 2019-11-17 DIAGNOSIS — Z6791 Unspecified blood type, Rh negative: Secondary | ICD-10-CM

## 2019-11-17 DIAGNOSIS — O26899 Other specified pregnancy related conditions, unspecified trimester: Secondary | ICD-10-CM

## 2019-11-17 DIAGNOSIS — O360130 Maternal care for anti-D [Rh] antibodies, third trimester, not applicable or unspecified: Secondary | ICD-10-CM | POA: Diagnosis not present

## 2019-11-17 DIAGNOSIS — O99013 Anemia complicating pregnancy, third trimester: Secondary | ICD-10-CM

## 2019-11-17 DIAGNOSIS — U071 COVID-19: Secondary | ICD-10-CM

## 2019-11-17 DIAGNOSIS — Z3A29 29 weeks gestation of pregnancy: Secondary | ICD-10-CM | POA: Diagnosis not present

## 2019-11-17 DIAGNOSIS — O26893 Other specified pregnancy related conditions, third trimester: Secondary | ICD-10-CM | POA: Diagnosis present

## 2019-11-17 DIAGNOSIS — O0990 Supervision of high risk pregnancy, unspecified, unspecified trimester: Secondary | ICD-10-CM

## 2019-11-17 DIAGNOSIS — O98512 Other viral diseases complicating pregnancy, second trimester: Secondary | ICD-10-CM

## 2019-11-17 LAB — URINALYSIS, ROUTINE W REFLEX MICROSCOPIC
Bacteria, UA: NONE SEEN
Bilirubin Urine: NEGATIVE
Glucose, UA: NEGATIVE mg/dL
Ketones, ur: NEGATIVE mg/dL
Leukocytes,Ua: NEGATIVE
Nitrite: NEGATIVE
Protein, ur: NEGATIVE mg/dL
Specific Gravity, Urine: 1.004 — ABNORMAL LOW (ref 1.005–1.030)
pH: 8 (ref 5.0–8.0)

## 2019-11-17 LAB — CBC
HCT: 34.5 % — ABNORMAL LOW (ref 36.0–46.0)
Hemoglobin: 11.5 g/dL — ABNORMAL LOW (ref 12.0–15.0)
MCH: 29.6 pg (ref 26.0–34.0)
MCHC: 33.3 g/dL (ref 30.0–36.0)
MCV: 88.7 fL (ref 80.0–100.0)
Platelets: 255 10*3/uL (ref 150–400)
RBC: 3.89 MIL/uL (ref 3.87–5.11)
RDW: 13.6 % (ref 11.5–15.5)
WBC: 20.2 10*3/uL — ABNORMAL HIGH (ref 4.0–10.5)
nRBC: 0 % (ref 0.0–0.2)

## 2019-11-17 LAB — RESPIRATORY PANEL BY RT PCR (FLU A&B, COVID)
Influenza A by PCR: NEGATIVE
Influenza B by PCR: NEGATIVE
SARS Coronavirus 2 by RT PCR: NEGATIVE

## 2019-11-17 LAB — RPR: RPR Ser Ql: NONREACTIVE

## 2019-11-17 MED ORDER — LACTATED RINGERS IV SOLN: INTRAVENOUS | Status: DC

## 2019-11-17 MED ORDER — DOCUSATE SODIUM 100 MG PO CAPS
100.0000 mg | ORAL_CAPSULE | Freq: Every day | ORAL | Status: DC
  Administered 2019-11-17 – 2019-11-19 (×3): 100 mg via ORAL
  Filled 2019-11-17 (×3): qty 1

## 2019-11-17 MED ORDER — ACETAMINOPHEN 325 MG PO TABS
650.0000 mg | ORAL_TABLET | ORAL | Status: DC | PRN
  Administered 2019-11-20: 650 mg via ORAL
  Filled 2019-11-17: qty 2

## 2019-11-17 MED ORDER — PRENATAL MULTIVITAMIN CH
1.0000 | ORAL_TABLET | Freq: Every day | ORAL | Status: DC
  Administered 2019-11-17 – 2019-12-01 (×15): 1 via ORAL
  Filled 2019-11-17 (×15): qty 1

## 2019-11-17 MED ORDER — CALCIUM CARBONATE ANTACID 500 MG PO CHEW
2.0000 | CHEWABLE_TABLET | ORAL | Status: DC | PRN
  Administered 2019-11-18 – 2019-11-24 (×6): 400 mg via ORAL
  Filled 2019-11-17 (×8): qty 2

## 2019-11-17 MED ORDER — BETAMETHASONE SOD PHOS & ACET 6 (3-3) MG/ML IJ SUSP
12.0000 mg | INTRAMUSCULAR | Status: AC
  Administered 2019-11-18: 12 mg via INTRAMUSCULAR

## 2019-11-17 MED ORDER — BETAMETHASONE SOD PHOS & ACET 6 (3-3) MG/ML IJ SUSP
12.0000 mg | Freq: Once | INTRAMUSCULAR | Status: AC
  Administered 2019-11-17: 12 mg via INTRAMUSCULAR
  Filled 2019-11-17: qty 5

## 2019-11-17 MED ORDER — LACTATED RINGERS IV BOLUS
1000.0000 mL | Freq: Once | INTRAVENOUS | Status: AC
  Administered 2019-11-17: 1000 mL via INTRAVENOUS

## 2019-11-17 NOTE — H&P (Signed)
Shadonna Benedick is a 24 y.o. female G3P1011 at [redacted]w[redacted]d presenting for vaginal bleeding with known placenta previa. This is her second admission for vaginal bleeding in pregnancy. Patient states that she was sitting in bed with her 22 month old when vaginal bleeding started around 12:30am. She describes the bleeding as running down her leg and soiling her clothes. Patient denies any contractions or abdominal pain. She reports good fetal movement. Patient was admitted in January for bleeding previa and completed a course of BMZ on 1/10-1/11. Patient with prenatal care at [facility] complicated by placenta previa, maternal obesity and short interval between pregnancies.   OB History    Gravida  3   Para  1   Term  1   Preterm      AB  1   Living  1     SAB  1   TAB      Ectopic      Multiple      Live Births  1          Past Medical History:  Diagnosis Date  . Asthma   . Pregnancy induced hypertension    Past Surgical History:  Procedure Laterality Date  . WISDOM TOOTH EXTRACTION     Family History: family history is not on file. Social History:  reports that she has never smoked. She has never used smokeless tobacco. She reports that she does not drink alcohol or use drugs.     Maternal Diabetes: No Genetic Screening: Declined Maternal Ultrasounds/Referrals: Normal Fetal Ultrasounds or other Referrals:  None Maternal Substance Abuse:  No Significant Maternal Medications:  ASA Significant Maternal Lab Results:  Rh negative s/p rhogam 10/14/19 Other Comments:  None  Review of Systems  See pertinent in HPI History   Last menstrual period 05/21/2019, unknown if currently breastfeeding. Exam Physical Exam  GENERAL: Well-developed, well-nourished female in no acute distress.  LUNGS: Clear to auscultation bilaterally.  HEART: Regular rate and rhythm. ABDOMEN: Soft, nontender, gravid PELVIC: Normal external female genitalia. Vagina is pink and rugated.  30 cc blood clot  evacuated from vaginal vault. No active vaginal bleeding visualized from closed os. Normal appearing cervix. Uterus is nontender. EXTREMITIES: No cyanosis, clubbing, or edema, 2+ distal pulses.  FHT: baseline 125, mod variability, 10x10 accels, no decels TOCO: no contractions  Prenatal labs: ABO, Rh: --/--/A NEG (01/16 2341) Antibody: POS (01/16 2341) Rubella: 3.20 (11/19 1511) RPR: Non Reactive (02/02 0818)  HBsAg: Negative (11/19 1511)  HIV: Non Reactive (02/02 0818)  GBS: --Lottie Dawson (05/07 1158)   Assessment/Plan: 24 yo G3P1011 at [redacted]w[redacted]d with placenta previa with hemorrhage - Admit to antepartum for remainder of the pregnancy - Patient to receive second course of BMZ - Patient to be delivered by cesarean section- goal is to deliver at 36 weeks if stable maternal/fetal unit - NICU consult   Rolin Schult 11/17/2019, 2:26 AM

## 2019-11-17 NOTE — Plan of Care (Signed)
  Problem: Education: Goal: Knowledge of disease or condition will improve Outcome: Progressing   

## 2019-11-17 NOTE — MAU Note (Signed)
Pt arrived  Ems with heavy vag bleeding . Know placenta previa. Stated she has felt baby move throughout. Bleeding  Started around 1am.  Denies any pain at this time.

## 2019-11-18 DIAGNOSIS — Z3A30 30 weeks gestation of pregnancy: Secondary | ICD-10-CM

## 2019-11-18 MED ORDER — SODIUM CHLORIDE 0.9% FLUSH
10.0000 mL | Freq: Two times a day (BID) | INTRAVENOUS | Status: DC
  Administered 2019-11-18 – 2019-11-30 (×22): 10 mL

## 2019-11-18 MED ORDER — SODIUM CHLORIDE 0.9% FLUSH
10.0000 mL | INTRAVENOUS | Status: DC | PRN
  Administered 2019-11-24: 12:00:00 10 mL

## 2019-11-18 MED ORDER — LORAZEPAM 0.5 MG PO TABS
1.0000 mg | ORAL_TABLET | Freq: Once | ORAL | Status: AC
  Administered 2019-11-18: 1 mg via ORAL
  Filled 2019-11-18: qty 2

## 2019-11-18 NOTE — Progress Notes (Signed)
Abby RN spoke with MD and MD declines PICC at this time requesting a midline to be placed. VU. Tomasita Morrow RN VAST

## 2019-11-18 NOTE — Progress Notes (Signed)
Dear Doctor: This patient has been identified as a candidate for PICC line for the following reason (s): approximately 6 weeks of infusions. If you agree, please write an order for the indicated device.   Thank you for supporting the early vascular access assessment program.

## 2019-11-18 NOTE — Plan of Care (Signed)
  Problem: Education: Goal: Knowledge of disease or condition will improve Outcome: Progressing   

## 2019-11-18 NOTE — Progress Notes (Signed)
FACULTY PRACTICE ANTEPARTUM COMPREHENSIVE PROGRESS NOTE  Madison Cook is a 24 y.o. G3P1011 at [redacted]w[redacted]d who is admitted for second episode of bleeding in the setting of known central placental previa.  Estimated Date of Delivery: 01/23/20  Length of Stay:  1 Days. Admitted 11/17/2019  Subjective: No further bleeding since admission Patient reports good fetal movement.  She reports no uterine contractions, no bleeding and no loss of fluid per vagina.  Vitals:  Blood pressure 130/80, pulse 98, temperature 97.7 F (36.5 C), temperature source Oral, resp. rate 18, height 5\' 3"  (1.6 m), weight 112 kg, last menstrual period 05/21/2019, SpO2 98 %, unknown if currently breastfeeding. Physical Examination: CONSTITUTIONAL: Well-developed, well-nourished female in no acute distress.  CARDIOVASCULAR: Normal heart rate noted RESPIRATORY: Effort and breath sounds normal, no problems with respiration noted MUSCULOSKELETAL: Normal range of motion. No edema and no tenderness. 2+ distal pulses. ABDOMEN: Soft, nontender, nondistended, gravid. CERVIX:  Deferred  Fetal monitoring: FHR: 135 bpm, Variability: moderate, Accelerations: Present, Decelerations: Absent  Uterine activity: No contractions per hour  Results for orders placed or performed during the hospital encounter of 11/17/19 (from the past 48 hour(s))  Respiratory Panel by RT PCR (Flu A&B, Covid) - Nasopharyngeal Swab     Status: None   Collection Time: 11/17/19  1:53 AM   Specimen: Nasopharyngeal Swab  Result Value Ref Range   SARS Coronavirus 2 by RT PCR NEGATIVE NEGATIVE    Comment: (NOTE) SARS-CoV-2 target nucleic acids are NOT DETECTED. The SARS-CoV-2 RNA is generally detectable in upper respiratoy specimens during the acute phase of infection. The lowest concentration of SARS-CoV-2 viral copies this assay can detect is 131 copies/mL. A negative result does not preclude SARS-Cov-2 infection and should not be used as the sole basis for  treatment or other patient management decisions. A negative result may occur with  improper specimen collection/handling, submission of specimen other than nasopharyngeal swab, presence of viral mutation(s) within the areas targeted by this assay, and inadequate number of viral copies (<131 copies/mL). A negative result must be combined with clinical observations, patient history, and epidemiological information. The expected result is Negative. Fact Sheet for Patients:  https://www.moore.com/ Fact Sheet for Healthcare Providers:  https://www.young.biz/ This test is not yet ap proved or cleared by the [place] FDA and  has been authorized for detection and/or diagnosis of SARS-CoV-2 by FDA under an Emergency Use Authorization (EUA). This EUA will remain  in effect (meaning this test can be used) for the duration of the COVID-19 declaration under Section 564(b)(1) of the Act, 21 U.S.C. section 360bbb-3(b)(1), unless the authorization is terminated or revoked sooner.    Influenza A by PCR NEGATIVE NEGATIVE   Influenza B by PCR NEGATIVE NEGATIVE    Comment: (NOTE) The Xpert Xpress SARS-CoV-2/FLU/RSV assay is intended as an aid in  the diagnosis of influenza from Nasopharyngeal swab specimens and  should not be used as a sole basis for treatment. Nasal washings and  aspirates are unacceptable for Xpert Xpress SARS-CoV-2/FLU/RSV  testing. Fact Sheet for Patients: https://www.moore.com/ Fact Sheet for Healthcare Providers: https://www.young.biz/ This test is not yet approved or cleared by the [place] FDA and  has been authorized for detection and/or diagnosis of SARS-CoV-2 by  FDA under an Emergency Use Authorization (EUA). This EUA will remain  in effect (meaning this test can be used) for the duration of the  Covid-19 declaration under Section 564(b)(1) of the Act, 21  U.S.C. section  360bbb-3(b)(1), unless the authorization is  terminated or  revoked. Performed at [facility], [address], [place], [place] [address]   CBC     Status: Abnormal   Collection Time: 11/17/19  2:05 AM  Result Value Ref Range   WBC 20.2 (H) 4.0 - 10.5 K/uL   RBC 3.89 3.87 - 5.11 MIL/uL   Hemoglobin 11.5 (L) 12.0 - 15.0 g/dL   HCT 60.4 (L) 54.0 - 98.1 %   MCV 88.7 80.0 - 100.0 fL   MCH 29.6 26.0 - 34.0 pg   MCHC 33.3 30.0 - 36.0 g/dL   RDW 19.1 47.8 - 29.5 %   Platelets 255 150 - 400 K/uL   nRBC 0.0 0.0 - 0.2 %    Comment: Performed at [facility], [address], [place], [place] [address]  RPR     Status: None   Collection Time: 11/17/19  2:05 AM  Result Value Ref Range   RPR Ser Ql NON REACTIVE NON REACTIVE    Comment: Performed at [facility], [address], [place], [place] [address]  Type and screen [facility]     Status: None (Preliminary result)   Collection Time: 11/17/19  2:05 AM  Result Value Ref Range   ABO/RH(D) A NEG    Antibody Screen POS    Sample Expiration 11/20/2019,2359    Antibody Identification      PASSIVELY ACQUIRED ANTI-D Performed at [facility], [address], [place], [place] [address]    Unit Number [id number]    Blood Component Type RED CELLS,LR    Unit division 00    Status of Unit ALLOCATED    Transfusion Status OK TO TRANSFUSE    Crossmatch Result COMPATIBLE    Unit Number [id number]    Blood Component Type RED CELLS,LR    Unit division 00    Status of Unit ALLOCATED    Transfusion Status OK TO TRANSFUSE    Crossmatch Result COMPATIBLE   Urinalysis, Routine w reflex microscopic     Status: Abnormal   Collection Time: 11/17/19  9:26 AM  Result Value Ref Range   Color, Urine STRAW (A) YELLOW   APPearance CLEAR CLEAR   Specific Gravity, Urine 1.004 (L) 1.005 - 1.030   pH 8.0 5.0 - 8.0   Glucose, UA NEGATIVE NEGATIVE mg/dL   Hgb urine dipstick LARGE (A) NEGATIVE   Bilirubin Urine  NEGATIVE NEGATIVE   Ketones, ur NEGATIVE NEGATIVE mg/dL   Protein, ur NEGATIVE NEGATIVE mg/dL   Nitrite NEGATIVE NEGATIVE   Leukocytes,Ua NEGATIVE NEGATIVE   RBC / HPF 0-5 0 - 5 RBC/hpf   WBC, UA 0-5 0 - 5 WBC/hpf   Bacteria, UA NONE SEEN NONE SEEN   Squamous Epithelial / LPF 0-5 0 - 5   Mucus PRESENT     Comment: Performed at [facility], [address], [place], [place] [address]    [place] MFM OB Transvaginal  Result Date: 10/14/2019 ----------------------------------------------------------------------  OBSTETRICS REPORT                       (Signed Final 10/14/2019 08:56 pm) ---------------------------------------------------------------------- Patient Info  ID #:       [id number]                          D.O.B.:  [birth year] (23 yrs)  Name:       [name]  Visit Date: 10/14/2019 12:49 pm ---------------------------------------------------------------------- Performed By  Performed By:     Corky Crafts             Ref. Address:      Jack  Attending:        Sander Nephew      [facility]                                        [place]  Referred By:      [place] ---------------------------------------------------------------------- Orders   #  Description                          Code         Ordered By   1  [place] MFM OB FOLLOW UP                  [id number]     Aletha Halim   2  [place] MFM OB TRANSVAGINAL               [id number]      CHARLIE PICKENS  ----------------------------------------------------------------------   #  Order #                    Accession #                 Episode #   1  [id number]                  [id number]                  [id number]   2  [id number]                  [id number]                  [id number]  ---------------------------------------------------------------------- Indications   Placenta  previa specified as without           O44.02   hemorrhage, second trimester   [redacted] weeks gestation of pregnancy                J8A.41   Obesity complicating pregnancy, second         O99.212   trimester   Short interval between pregancies, 2nd         O09.892   trimester   Encounter for other antenatal screening        Z36.2   follow-up  ---------------------------------------------------------------------- Fetal Evaluation  Num Of Fetuses:          1  Fetal Heart Rate(bpm):   137  Cardiac Activity:        Observed  Presentation:  Transverse, head to maternal left  Placenta:                Central previa  P. Cord Insertion:       Marginal insertion prev seen  Amniotic Fluid  AFI FV:      Within normal limits                              Largest Pocket(cm)                              7.62 ---------------------------------------------------------------------- Biometry  BPD:      59.6  mm     G. Age:  24w 2d         44  %    CI:        77.12   %    70 - 86                                                          FL/HC:       19.3  %    18.7 - 20.9  HC:      214.9  mm     G. Age:  23w 4d         10  %    HC/AC:       1.08       1.05 - 1.21  AC:      199.5  mm     G. Age:  24w 4d         51  %    FL/BPD:      69.5  %    71 - 87  FL:       41.4  mm     G. Age:  23w 3d         15  %    FL/AC:       20.8  %    20 - 24  HUM:      38.3  mm     G. Age:  23w 4d         24  %  LV:        3.9  mm  Est. FW:     656   gm     1 lb 7 oz     31  % ---------------------------------------------------------------------- OB History  Gravidity:    3         Term:   1        Prem:   0        SAB:   1  TOP:          0       Ectopic:  0        Living: 1 ---------------------------------------------------------------------- Gestational Age  LMP:           20w 6d        Date:  05/21/19                 EDD:   02/25/20  U/S Today:     24w 0d  EDD:   02/03/20  Best:          24w 2d     Det. By:  U/S   (09/04/19)          EDD:   02/01/20 ---------------------------------------------------------------------- Anatomy  Cranium:               Appears normal         LVOT:                   Appears normal  Cavum:                 Previously seen        Aortic Arch:            Not well visualized  Ventricles:            Appears normal         Ductal Arch:            Not well visualized  Choroid Plexus:        Previously seen        Diaphragm:              Previously seen  Cerebellum:            Previously seen        Stomach:                Appears normal, left                                                                        sided  Posterior Fossa:       Previously seen        Abdomen:                Appears normal  Nuchal Fold:           Previously seen        Abdominal Wall:         Previously seen  Face:                  Orbits and profile     Cord Vessels:           Previously seen                         previously seen  Lips:                  Previously seen        Kidneys:                Appear normal  Palate:                Not well visualized    Bladder:                Appears normal  Thoracic:              Appears normal         Spine:                  Previously seen  Heart:  Appears normal         Upper Extremities:      Previously seen                         (4CH, axis, and                         situs)  RVOT:                  Appears normal         Lower Extremities:      Previously seen  Other:  Female gender prev seen.  Nasal bone visualized. Heels and 5th digit          prev visualized. Technically difficult due to maternal habitus and fetal          position. ---------------------------------------------------------------------- Cervix Uterus Adnexa  Cervix  Length:           4.03  cm.  Normal appearance by transvaginal scan ---------------------------------------------------------------------- Impression  Known placenta previa  Normal interval growth  Good fetal movement and  amniotic fluid  Marginal cord insertion not seen today. ---------------------------------------------------------------------- Recommendations  Repeat growth in 4 weeks  Consider delivery by 35-36 weeks ----------------------------------------------------------------------               Lin Landsman, MD Electronically Signed Final Report   10/14/2019 08:56 pm ----------------------------------------------------------------------  [place] MFM OB DETAIL +14 WK  Result Date: 09/04/2019 ----------------------------------------------------------------------  OBSTETRICS REPORT                       (Signed Final 09/04/2019 03:22 pm) ---------------------------------------------------------------------- Patient Info  ID #:       [id number]                          D.O.B.:  1996/02/17 (23 yrs)  Name:       [name]                Visit Date: 09/04/2019 01:50 pm ---------------------------------------------------------------------- Performed By  Performed By:     Tommie Raymond BS,       Ref. Address:     [address]                    [address]                                                             [address]  Attending:        Lin Landsman      [facility]  Referred By:      [facility] Mila Merry ---------------------------------------------------------------------- Orders   #  Description  Code         Ordered By   1  [place] MFM OB DETAIL +14 WK              [id number]     Carencro Bing  ----------------------------------------------------------------------   #  Order #                    Accession #                 Episode #   1  [id number]                  [id number]                  [id number]  ---------------------------------------------------------------------- Indications   [redacted] weeks gestation of pregnancy                Z3A.18   Encounter for antenatal screening for          Z36.3    malformations   Obesity complicating pregnancy, second         O99.212   trimester   Short interval between pregancies, 2nd         O09.892   trimester   Poor obstetric history: Previous               O09.299   preeclampsia / eclampsia/gestational HTN  ---------------------------------------------------------------------- Fetal Evaluation  Num Of Fetuses:         1  Fetal Heart Rate(bpm):  150  Cardiac Activity:       Observed  Presentation:           Variable  Placenta:               Central previa  P. Cord Insertion:      Marginal insertion  Amniotic Fluid  AFI FV:      Within normal limits                              Largest Pocket(cm)                              6.5 ---------------------------------------------------------------------- Biometry  BPD:      42.2  mm     G. Age:  18w 5d         60  %    CI:        80.07   %    70 - 86                                                          FL/HC:      18.6   %    16.1 - 18.3  HC:       149   mm     G. Age:  18w 0d         17  %    HC/AC:      1.12        1.09 - 1.39  AC:       133   mm     G. Age:  18w 6d         54  %  FL/BPD:     65.6   %  FL:       27.7  mm     G. Age:  18w 3d         40  %    FL/AC:      20.8   %    20 - 24  HUM:      27.2  mm     G. Age:  18w 5d         55  %  CER:      19.3  mm     G. Age:  18w 5d         53  %  NFT:       2.6  mm  LV:          8  mm  CM:        2.9  mm  Est. FW:     247  gm      0 lb 9 oz     46  % ---------------------------------------------------------------------- OB History  Gravidity:    3         Term:   1        Prem:   0        SAB:   1  TOP:          0       Ectopic:  0        Living: 1 ---------------------------------------------------------------------- Gestational Age  LMP:           15w 1d        Date:  05/21/19                 EDD:   02/25/20  U/S Today:     18w 4d                                        EDD:   02/01/20  Best:          18w 4d     Det. By:  U/S (09/04/19)           EDD:   02/01/20  ---------------------------------------------------------------------- Anatomy  Cranium:               Appears normal         LVOT:                   Not well visualized  Cavum:                 Appears normal         Aortic Arch:            Not well visualized  Ventricles:            Appears normal         Ductal Arch:            Not well visualized  Choroid Plexus:        Appears normal         Diaphragm:              Appears normal  Cerebellum:            Appears normal         Stomach:                Appears  normal, left                                                                        sided  Posterior Fossa:       Appears normal         Abdomen:                Appears normal  Nuchal Fold:           Appears normal         Abdominal Wall:         Appears nml (cord                                                                        insert, abd wall)  Face:                  Appears normal         Cord Vessels:           Appears normal (3                         (orbits and profile)                           vessel cord)  Lips:                  Appears normal         Kidneys:                Appear normal  Palate:                Not well visualized    Bladder:                Appears normal  Thoracic:              Appears normal         Spine:                  Appears normal  Heart:                 Not well visualized    Upper Extremities:      Appears normal  RVOT:                  Not well visualized    Lower Extremities:      Appears normal  Other:  Parents do not wish to know sex of fetus at this time- Female gender.          Nasal bone visualized. Heels and 5th digit visualized. Technically          difficult due to maternal habitus and fetal position. ---------------------------------------------------------------------- Cervix Uterus Adnexa  Cervix  Length:           3.66  cm.  Normal appearance by transabdominal scan.  Uterus  No abnormality visualized.  Left Ovary  Within normal limits. No adnexal mass  visualized.  Right Ovary  Not visualized. No adnexal mass visualized.  Cul De Sac  No free fluid seen.  Adnexa  No abnormality visualized. ---------------------------------------------------------------------- Impression  Normal interval growth.  No ultrasonic evidence of structural  fetal anomalies.  Suboptimal views of the fetal anatomy was obtained  secondary to fetal position.  Marginal cord insertion  Central placental previa obtained. I discussed the finding of a  placenta previa we discussed reviewed the diagnosis,  evaluation and management. We discussed that in many  cases these do not resolve however, we will assess in the  third trimester. We reviewed the complication of bleeding and  possible labor. We discussed the increased risk of inpatient  stay, preterm delivery and emergent cesarean delivery.  Lastly, I conveyed that if the placenta previa does not resolve  we recommend delivery between 36-37 weeks. There is no  evidence of placenta accreta.  Bleeding precaution were  reviewed. ---------------------------------------------------------------------- Recommendations  Follow up anatomy in 4 weeks  Repeat growth at 28-32 weeks  Bleeding precautions provided.  If placenta previa persist recommend delivery by 36-37 weeks ----------------------------------------------------------------------               Lin Landsman, MD Electronically Signed Final Report   09/04/2019 03:22 pm ----------------------------------------------------------------------  [place] MFM OB FOLLOW UP  Result Date: 11/06/2019 ----------------------------------------------------------------------  OBSTETRICS REPORT                       (Signed Final 11/06/2019 04:25 pm) ---------------------------------------------------------------------- Patient Info  ID #:       [id number]                          D.O.B.:  Feb 03, 1996 (23 yrs)  Name:       [name]                Visit Date: 11/06/2019 03:51 pm  ---------------------------------------------------------------------- Performed By  Performed By:     Earley Brooke     Ref. Address:     [address]                    [address]                                                             [address]  Attending:        Ma Rings MD         [facility]  Referred By:      [facility] Mila Merry ---------------------------------------------------------------------- Orders   #  Description                          Code  Ordered By   1  US MFM OB FOLLOW UP                  [id number]     Lin LandsmanORENTHIAN                                                        BOOKER  ----------------------------------------------------------------------   #  Order #                    Accession #                 Episode #   1  [id number]                  [id number]  ---------------------------------------------------------------------- Indications   Placenta previa specified as without           O44.02   hemorrhage, second trimester   [redacted] weeks gestation of pregnancy                Z3A.27   Obesity complicating pregnancy, second         O99.212   trimester   Short interval between pregancies, 2nd         O09.892   trimester   Encounter for other antenatal screening        Z36.2   follow-up  ---------------------------------------------------------------------- Fetal Evaluation  Num Of Fetuses:         1  Fetal Heart Rate(bpm):  145  Cardiac Activity:       Observed  Presentation:           Cephalic  Placenta:               Central previa  P. Cord Insertion:      Marginal insertion  Amniotic Fluid  AFI FV:      Within normal limits  AFI Sum(cm)     %Tile       Largest Pocket(cm)  23.8            96          8.63  RUQ(cm)       RLQ(cm)       LUQ(cm)        LLQ(cm)  8.63          6.12          5.57           3.48  ---------------------------------------------------------------------- Biometry  BPD:      71.6  mm     G. Age:  28w 5d         76  %    CI:        74.95   %    70 - 86                                                          FL/HC:      19.1   %    18.8 - 20.6  HC:  262.4  mm     G. Age:  28w 4d         52  %    HC/AC:      1.07        1.05 - 1.21  AC:      244.7  mm     G. Age:  28w 5d         76  %    FL/BPD:     70.1   %    71 - 87  FL:       50.2  mm     G. Age:  27w 0d         19  %    FL/AC:      20.5   %    20 - 24  HUM:      45.5  mm     G. Age:  26w 6d         32  %  LV:        4.9  mm  Est. FW:    1178  gm    2 lb 10 oz      60  % ---------------------------------------------------------------------- OB History  Gravidity:    3         Term:   1        Prem:   0        SAB:   1  TOP:          0       Ectopic:  0        Living: 1 ---------------------------------------------------------------------- Gestational Age  LMP:           24w 1d        Date:  05/21/19                 EDD:   02/25/20  U/S Today:     28w 2d                                        EDD:   01/27/20  Best:          27w 4d     Det. By:  U/S  (09/04/19)          EDD:   02/01/20 ---------------------------------------------------------------------- Anatomy  Cranium:               Appears normal         LVOT:                   Previously seen  Cavum:                 Previously seen        Aortic Arch:            Not well visualized  Ventricles:            Appears normal         Ductal Arch:            Not well visualized  Choroid Plexus:        Previously seen        Diaphragm:              Appears normal  Cerebellum:            Previously seen  Stomach:                Appears normal, left                                                                        sided  Posterior Fossa:       Previously seen        Abdomen:                Appears normal  Nuchal Fold:           Previously seen        Abdominal Wall:         Previously  seen  Face:                  Orbits and profile     Cord Vessels:           Previously seen                         previously seen  Lips:                  Previously seen        Kidneys:                Appear normal  Palate:                Not well visualized    Bladder:                Appears normal  Thoracic:              Appears normal         Spine:                  Previously seen  Heart:                 Appears normal         Upper Extremities:      Previously seen                         (4CH, axis, and                         situs)  RVOT:                  Previously seen        Lower Extremities:      Previously seen  Other:  Female gender prev seen.  Nasal bone, Heels and 5th digit prev          visualized. Technically difficult due to maternal habitus and fetal          position. ---------------------------------------------------------------------- Cervix Uterus Adnexa  Cervix  Length:           3.01  cm.  Normal appearance by transabdominal scan. ---------------------------------------------------------------------- Comments  The patient has been followed for placenta previa. She  denies any problems since her last visit.  The fetal growth and amniotic fluid level were appropriate for  her gestational age  A placenta previa continues to be noted on  today's exam.  Precautions due to the placenta previa were discussed.  A follow-up exam was scheduled in 6 weeks assess the  placental location.  Should a placenta previa be noted during her next exam, a  cesarean delivery will recommended at around 37 weeks. ----------------------------------------------------------------------                   Ma Rings, MD Electronically Signed Final Report   11/06/2019 04:25 pm ----------------------------------------------------------------------  [place] MFM OB FOLLOW UP  Result Date: 10/14/2019 ----------------------------------------------------------------------  OBSTETRICS REPORT                       (Signed  Final 10/14/2019 08:56 pm) ---------------------------------------------------------------------- Patient Info  ID #:       [id number]                          D.O.B.:  08-May-1996 (23 yrs)  Name:       [name]                Visit Date: 10/14/2019 12:49 pm ---------------------------------------------------------------------- Performed By  Performed By:     Tomma Lightning             Ref. Address:      [address]                    [address]                                                              [address]  Attending:        Lin Landsman      [facility]  Referred By:      [facility] Mila Merry ---------------------------------------------------------------------- Orders   #  Description                          Code         Ordered By   1  [place] MFM OB FOLLOW UP                  [phone]     Lima Bing   2  [place] MFM OB TRANSVAGINAL               [id number]      CHARLIE PICKENS  ----------------------------------------------------------------------   #  Order #                    Accession #                 Episode #   1  [id number]  [id number]   2  [id number]                  [id number]                  [id number]  ---------------------------------------------------------------------- Indications   Placenta previa specified as without           O44.02   hemorrhage, second trimester   [redacted] weeks gestation of pregnancy                Z3A.24   Obesity complicating pregnancy, second         O99.212   trimester   Short interval between pregancies, 2nd         O09.892   trimester   Encounter for other antenatal screening        Z36.2   follow-up  ---------------------------------------------------------------------- Fetal Evaluation  Num Of Fetuses:          1  Fetal Heart Rate(bpm):   137  Cardiac Activity:        Observed  Presentation:            Transverse, head to maternal left   Placenta:                Central previa  P. Cord Insertion:       Marginal insertion prev seen  Amniotic Fluid  AFI FV:      Within normal limits                              Largest Pocket(cm)                              7.62 ---------------------------------------------------------------------- Biometry  BPD:      59.6  mm     G. Age:  24w 2d         44  %    CI:        77.12   %    70 - 86                                                          FL/HC:       19.3  %    18.7 - 20.9  HC:      214.9  mm     G. Age:  23w 4d         10  %    HC/AC:       1.08       1.05 - 1.21  AC:      199.5  mm     G. Age:  24w 4d         51  %    FL/BPD:      69.5  %    71 - 87  FL:       41.4  mm     G. Age:  23w 3d         15  %    FL/AC:       20.8  %    20 - 24  HUM:      38.3  mm     G. Age:  23w 4d         24  %  LV:        3.9  mm  Est. FW:     656   gm     1 lb 7 oz     31  % ---------------------------------------------------------------------- OB History  Gravidity:    3         Term:   1        Prem:   0        SAB:   1  TOP:          0       Ectopic:  0        Living: 1 ---------------------------------------------------------------------- Gestational Age  LMP:           20w 6d        Date:  05/21/19                 EDD:   02/25/20  U/S Today:     24w 0d                                        EDD:   02/03/20  Best:          24w 2d     Det. By:  U/S  (09/04/19)          EDD:   02/01/20 ---------------------------------------------------------------------- Anatomy  Cranium:               Appears normal         LVOT:                   Appears normal  Cavum:                 Previously seen        Aortic Arch:            Not well visualized  Ventricles:            Appears normal         Ductal Arch:            Not well visualized  Choroid Plexus:        Previously seen        Diaphragm:              Previously seen  Cerebellum:            Previously seen        Stomach:                Appears normal, left                                                                         sided  Posterior Fossa:       Previously seen        Abdomen:                Appears normal  Nuchal Fold:           Previously seen        Abdominal Wall:  Previously seen  Face:                  Orbits and profile     Cord Vessels:           Previously seen                         previously seen  Lips:                  Previously seen        Kidneys:                Appear normal  Palate:                Not well visualized    Bladder:                Appears normal  Thoracic:              Appears normal         Spine:                  Previously seen  Heart:                 Appears normal         Upper Extremities:      Previously seen                         (4CH, axis, and                         situs)  RVOT:                  Appears normal         Lower Extremities:      Previously seen  Other:  Female gender prev seen.  Nasal bone visualized. Heels and 5th digit          prev visualized. Technically difficult due to maternal habitus and fetal          position. ---------------------------------------------------------------------- Cervix Uterus Adnexa  Cervix  Length:           4.03  cm.  Normal appearance by transvaginal scan ---------------------------------------------------------------------- Impression  Known placenta previa  Normal interval growth  Good fetal movement and amniotic fluid  Marginal cord insertion not seen today. ---------------------------------------------------------------------- Recommendations  Repeat growth in 4 weeks  Consider delivery by 35-36 weeks ----------------------------------------------------------------------               Lin Landsman, MD Electronically Signed Final Report   10/14/2019 08:56 pm ----------------------------------------------------------------------   Current scheduled medications . docusate sodium  100 mg Oral Daily  . prenatal multivitamin  1 tablet Oral Q1200    I have reviewed the  patient's current medications.  ASSESSMENT: Principal Problem:   Placenta previa with hemorrhage in third trimester Active Problems:   Supervision of high risk pregnancy, antepartum   Rh negative state in antepartum period   PLAN: Continue close observation She has two units pRBCs on hold at all times, apparently has minor A1 antibody (common with A neg patients as per Blood Bank).   Ordered for checking T&S and CBC q72h Continue NST q shift, tocometer as needed Continue routine antenatal care.   Jaynie Collins, MD, FACOG Obstetrician & Gynecologist, [facility] for [employer], [facility]

## 2019-11-19 ENCOUNTER — Telehealth: Payer: Commercial Managed Care - PPO | Admitting: Obstetrics & Gynecology

## 2019-11-19 MED ORDER — DOCUSATE SODIUM 100 MG PO CAPS
100.0000 mg | ORAL_CAPSULE | Freq: Two times a day (BID) | ORAL | Status: DC | PRN
  Administered 2019-11-20 – 2019-11-29 (×8): 100 mg via ORAL
  Filled 2019-11-19 (×8): qty 1

## 2019-11-19 NOTE — Progress Notes (Signed)
Spoke with Meghan RN about lab draw and was educated that the lab draw was not until tomorrow, RN on the unit can attempt to draw Labs from [facility]. Also spoke with Meghan and the [facility] dsg change is not due until Monday and the midline dsg at this time is intact, dry, and clean. If the patient is still here Monday then IV team consult will be re entered for help with dsg change.

## 2019-11-19 NOTE — Progress Notes (Signed)
Daily Antepartum Note  Admission Date: 11/17/2019 Current Date: 11/19/2019 10:11 AM  Madison Cook is a 24 y.o. G3P1011 @ [redacted]w[redacted]d, HD#3, admitted for VB #2 in setting of known previa.  Pregnancy complicated by: Patient Active Problem List   Diagnosis Date Noted  . Placenta previa with hemorrhage in third trimester 11/17/2019  . Transient hypertension of pregnancy 10/14/2019  . COVID-19 affecting pregnancy in second trimester 09/25/2019  . Placenta previa 09/09/2019  . History of gestational hypertension 08/22/2019  . Short interval between pregnancies affecting pregnancy, antepartum 08/22/2019  . Late prenatal care 08/22/2019  . BMI 40.0-44.9, adult (HCC) 12/17/2018  . Obesity in pregnancy 12/17/2018  . Supervision of high risk pregnancy, antepartum 12/17/2018  . Rh negative state in antepartum period 12/17/2018    Overnight/24hr events:  none  Subjective:  No bleeding or spotting. No complaints  Last bleed: early evening 2/15, old blood  Objective:    Current Vital Signs 24h Vital Sign Ranges  T 98.1 F (36.7 C) Temp  Avg: 97.9 F (36.6 C)  Min: 97.5 F (36.4 C)  Max: 98.1 F (36.7 C)  BP 105/60 BP  Min: 105/60  Max: 134/84  HR 90 Pulse  Avg: 97.8  Min: 90  Max: 113  RR 18 Resp  Avg: 18  Min: 18  Max: 18  SaO2 98 % Room Air SpO2  Avg: 98.4 %  Min: 98 %  Max: 99 %       24 Hour I/O Current Shift I/O  Time Ins Outs No intake/output data recorded. No intake/output data recorded.   Patient Vitals for the past 24 hrs:  BP Temp Temp src Pulse Resp SpO2  11/19/19 0904 105/60 98.1 F (36.7 C) Oral 90 18 98 %  11/18/19 2342 134/84 - - 92 18 99 %  11/18/19 1914 127/80 97.8 F (36.6 C) Oral (!) 101 18 99 %  11/18/19 1641 123/73 98.1 F (36.7 C) Oral 93 18 98 %  11/18/19 1234 121/61 (!) 97.5 F (36.4 C) Oral (!) 113 18 98 %   FHT: 150 baseline, no accel, no decel, mod variability Toco: quiet x 75m  Physical exam: General: Well nourished, well developed female in  no acute distress. Abdomen: gravid nttp Cardiovascular: S1, S2 normal, no murmur, rub or gallop, regular rate and rhythm Respiratory: CTAB Extremities: no clubbing, cyanosis or edema Skin: Warm and dry.  Neuro: 2+brachial  Medications: Current Facility-Administered Medications  Medication Dose Route Frequency Provider Last Rate Last Admin  . acetaminophen (TYLENOL) tablet 650 mg  650 mg Oral Q4H PRN Constant, Peggy, MD      . calcium carbonate (TUMS - dosed in mg elemental calcium) chewable tablet 400 mg of elemental calcium  2 tablet Oral Q4H PRN Constant, Peggy, MD   400 mg of elemental calcium at 11/18/19 2233  . docusate sodium (COLACE) capsule 100 mg  100 mg Oral Daily Constant, Peggy, MD   100 mg at 11/19/19 0951  . lactated ringers infusion   Intravenous Continuous Gerrit Heck, CNM      . lactated ringers infusion   Intravenous Continuous Constant, Peggy, MD 125 mL/hr at 11/18/19 0447 New Bag at 11/18/19 0447  . prenatal multivitamin tablet 1 tablet  1 tablet Oral Q1200 Constant, Peggy, MD   1 tablet at 11/19/19 0951  . sodium chloride flush (NS) 0.9 % injection 10-40 mL  10-40 mL Intracatheter Q12H Channahon Bing, MD   10 mL at 11/19/19 0951  . sodium chloride flush (NS) 0.9 %  injection 10-40 mL  10-40 mL Intracatheter PRN Aletha Halim, MD        Labs:  Recent Labs  Lab 11/17/19 0205  WBC 20.2*  HGB 11.5*  HCT 34.5*  PLT 255   A NEG with +rhogam antibodies  Radiology: no new imaging 2/3: 60%, 1178gm, ac 76%, afi normal. Central previa and marginal cord insertion  Assessment & Plan:  Pt doing well *Pregnancy: daily NSTs. Category I tracing. Follow up NST for today *Previa: d/w her that I recommend at least two weeks since this is her 2nd bleed but may do longer. Rpt transvag u/s tomorrow.  *Rh neg: s/p rhogam 1/11 *Preterm: rescue bmz 2/14 and 2/15 after BMZ course 1/10-11. Consult NICU prn *PPx: scds *FEN/GI: regular diet, sliv *Dispo: see above  Durene Romans MD Attending Center for [facility])

## 2019-11-20 ENCOUNTER — Inpatient Hospital Stay (HOSPITAL_BASED_OUTPATIENT_CLINIC_OR_DEPARTMENT_OTHER): Payer: Commercial Managed Care - PPO

## 2019-11-20 DIAGNOSIS — O99213 Obesity complicating pregnancy, third trimester: Secondary | ICD-10-CM | POA: Diagnosis not present

## 2019-11-20 DIAGNOSIS — O4413 Placenta previa with hemorrhage, third trimester: Secondary | ICD-10-CM

## 2019-11-20 DIAGNOSIS — Z3A29 29 weeks gestation of pregnancy: Secondary | ICD-10-CM

## 2019-11-20 DIAGNOSIS — O99019 Anemia complicating pregnancy, unspecified trimester: Secondary | ICD-10-CM | POA: Diagnosis not present

## 2019-11-20 LAB — IRON AND TIBC
Iron: 65 ug/dL (ref 28–170)
Saturation Ratios: 12 % (ref 10.4–31.8)
TIBC: 556 ug/dL — ABNORMAL HIGH (ref 250–450)
UIBC: 491 ug/dL

## 2019-11-20 LAB — TYPE AND SCREEN
ABO/RH(D): A NEG
Antibody Screen: POSITIVE
Unit division: 0
Unit division: 0

## 2019-11-20 LAB — VITAMIN B12: Vitamin B-12: 158 pg/mL — ABNORMAL LOW (ref 180–914)

## 2019-11-20 LAB — BPAM RBC
Blood Product Expiration Date: 202103242359
Blood Product Expiration Date: 202103242359
Unit Type and Rh: 9500
Unit Type and Rh: 9500

## 2019-11-20 LAB — CBC
HCT: 29.4 % — ABNORMAL LOW (ref 36.0–46.0)
Hemoglobin: 9.3 g/dL — ABNORMAL LOW (ref 12.0–15.0)
MCH: 29.5 pg (ref 26.0–34.0)
MCHC: 31.6 g/dL (ref 30.0–36.0)
MCV: 93.3 fL (ref 80.0–100.0)
Platelets: 200 10*3/uL (ref 150–400)
RBC: 3.15 MIL/uL — ABNORMAL LOW (ref 3.87–5.11)
RDW: 14.4 % (ref 11.5–15.5)
WBC: 17.5 10*3/uL — ABNORMAL HIGH (ref 4.0–10.5)
nRBC: 0.1 % (ref 0.0–0.2)

## 2019-11-20 LAB — RETICULOCYTES
Immature Retic Fract: 38.1 % — ABNORMAL HIGH (ref 2.3–15.9)
RBC.: 3.36 MIL/uL — ABNORMAL LOW (ref 3.87–5.11)
Retic Count, Absolute: 108.2 10*3/uL (ref 19.0–186.0)
Retic Ct Pct: 3.2 % — ABNORMAL HIGH (ref 0.4–3.1)

## 2019-11-20 LAB — FERRITIN: Ferritin: 7 ng/mL — ABNORMAL LOW (ref 11–307)

## 2019-11-20 LAB — FOLATE: Folate: 33.4 ng/mL (ref >5.9)

## 2019-11-20 IMAGING — US US MFM OB LIMITED
1 series · 14 of 28 positions shown · non-contrast
Comparison: none

1  US MFM OB LIMITED                    76815.01     SAADON
 ----------------------------------------------------------------------

Indications
  Placenta previa with hemorrhage, third         [28]
  trimester
  Obesity complicating pregnancy, third          [28]
  Short interval between pregancies, 3rd         [28]
  Late to prenatal care, third trimester         [28]
  Poor obstetric history: Previous               [28]
  preeclampsia / eclampsia/gestational HTN
  Rh negative state in antepartum                [28]
  29 weeks gestation of pregnancy
Vital Signs
 BMI:
Fetal Evaluation
 Num Of Fetuses:         1
 Fetal Heart Rate(bpm):  140
 Cardiac Activity:       Observed
 Presentation:           Transverse, head to maternal right
 Placenta:               Central previa
 P. Cord Insertion:      Marginal insertion
 Amniotic Fluid
 AFI FV:      Within normal limits
 AFI Sum(cm)     %Tile       Largest Pocket(cm)
 21.83           88
 RUQ(cm)       RLQ(cm)       LUQ(cm)        LLQ(cm)
OB History
 Gravidity:    3         Term:   1        Prem:   0        SAB:   1
 TOP:          0       Ectopic:  0        Living: 1
Gestational Age
 LMP:           26w 1d        Date:  [DATE]                 EDD:   [DATE]
 Best:          29w 4d     Det. By:  U/S  ([DATE])          EDD:   [DATE]
Anatomy
 Stomach:               Visualized             Bladder:                Visualized
Cervix Uterus Adnexa
 Cervix
 Normal appearance by transabdominal scan.
Impression
 Patient with known placenta previa was admitted with vaginal
 bleeding 3 days ago.  She had received a course of antenatal
 corticosteroids.
 Limited ultrasound study was performed.  Amniotic fluid is
 normal good fetal activity seen.  Placenta previa covering the
 internal os is seen.  Bulk of the placenta is anterior and since
 it extends  posteriorly over the internal os transvaginal
 evaluation was not performed.
 Marginal cord insertion is also seen.

[Series 1: us mfm ob limited · 37 acquisitions, 14 frames shown]
[im 2/37]
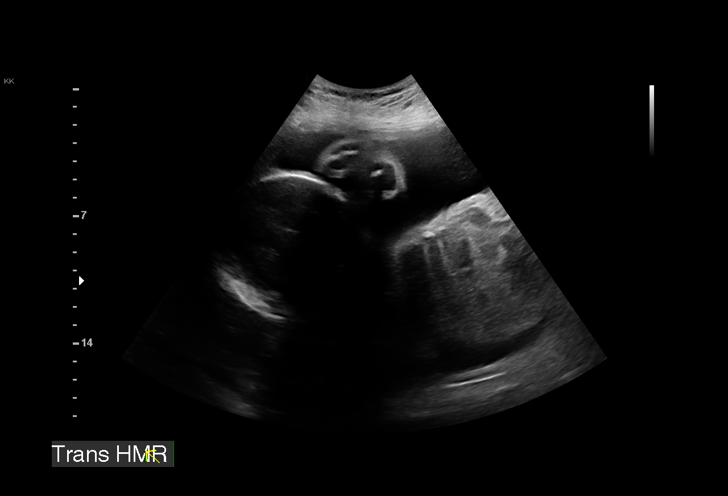
[im 5/37]
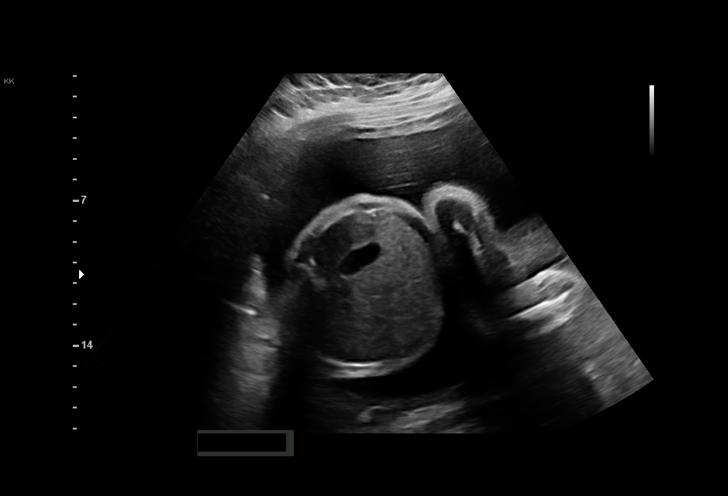
[im 7/37]
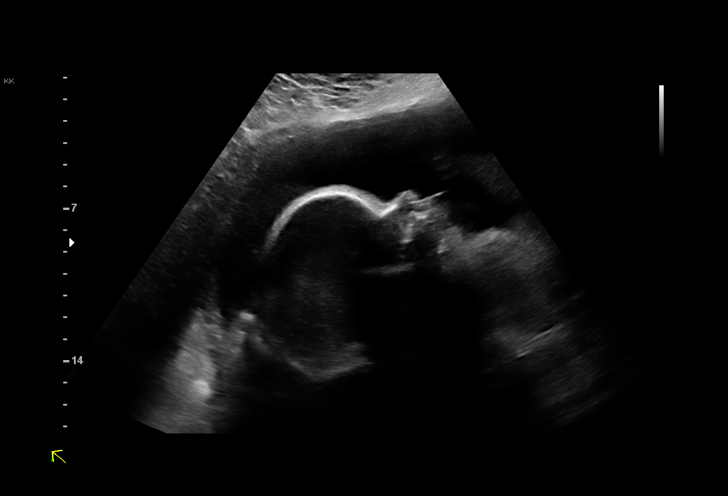
[im 10/37]
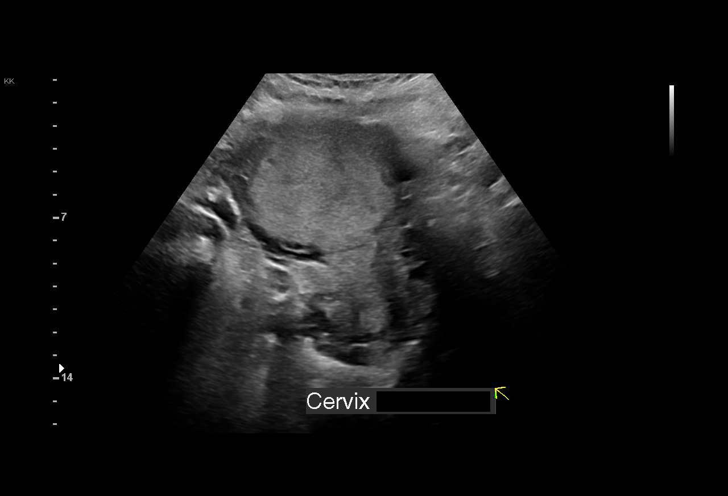
[im 13/37]
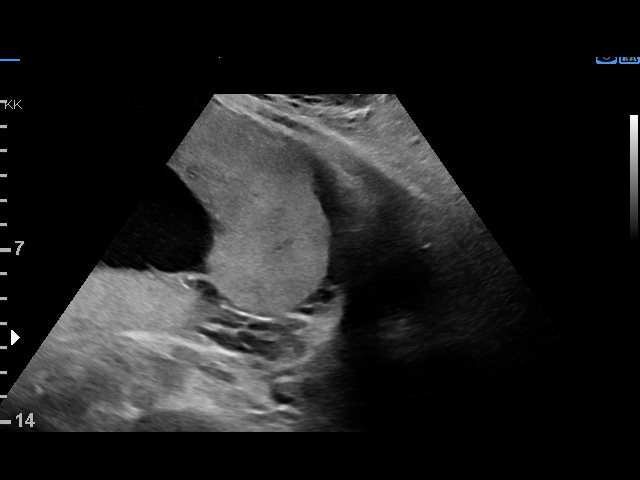
[im 15/37]
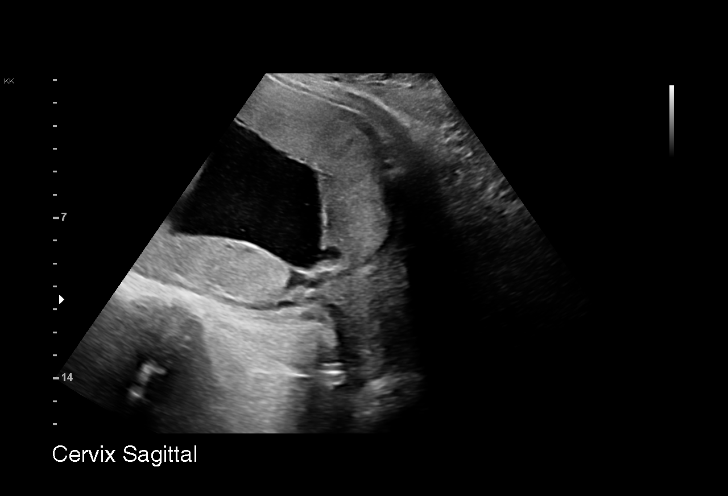
[im 18/37]
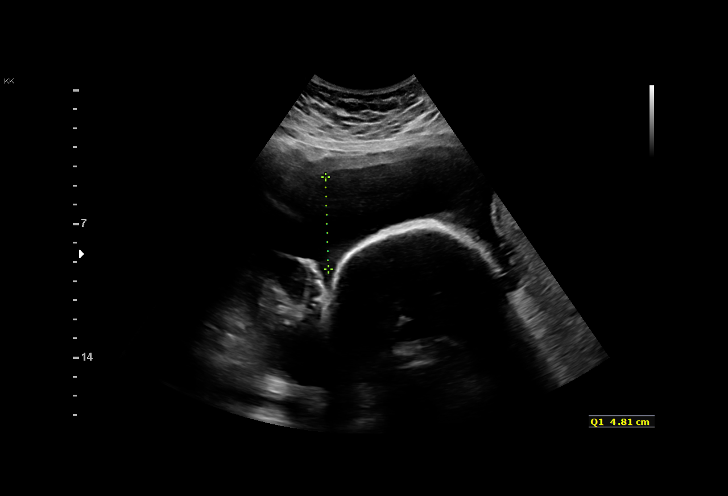
[im 21/37]
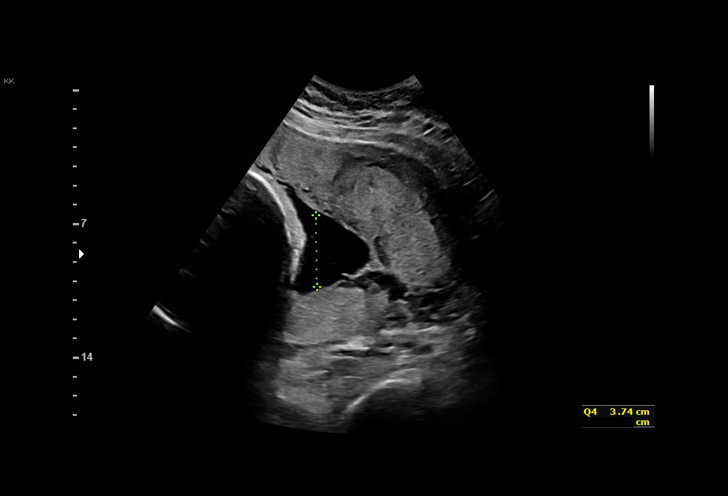
[im 23/37]
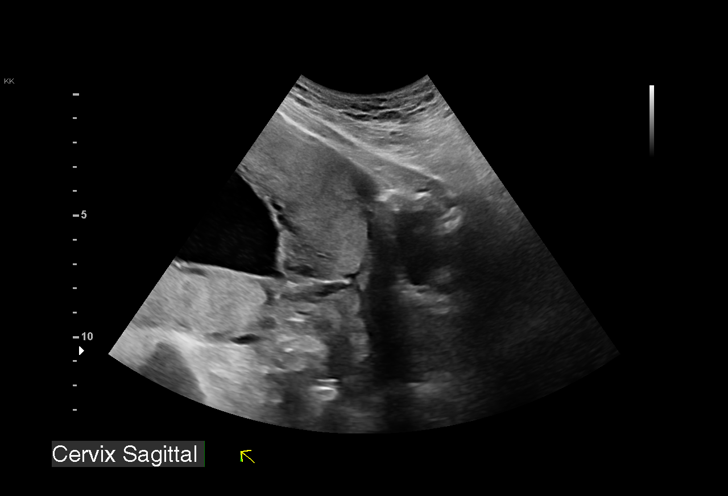
[im 26/37]
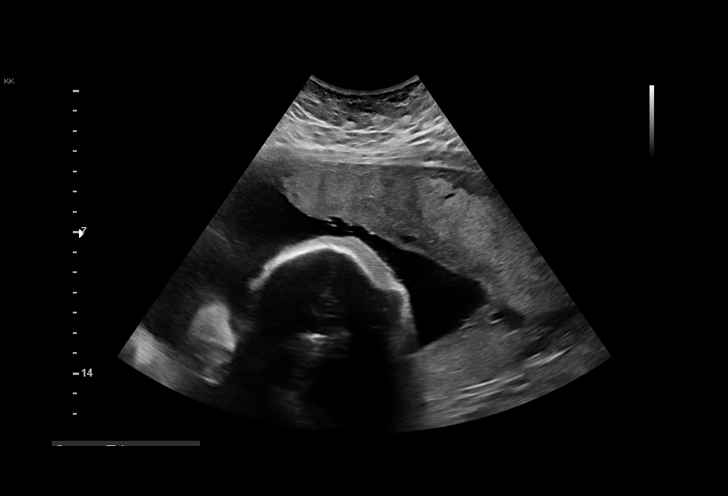
[im 29/37]
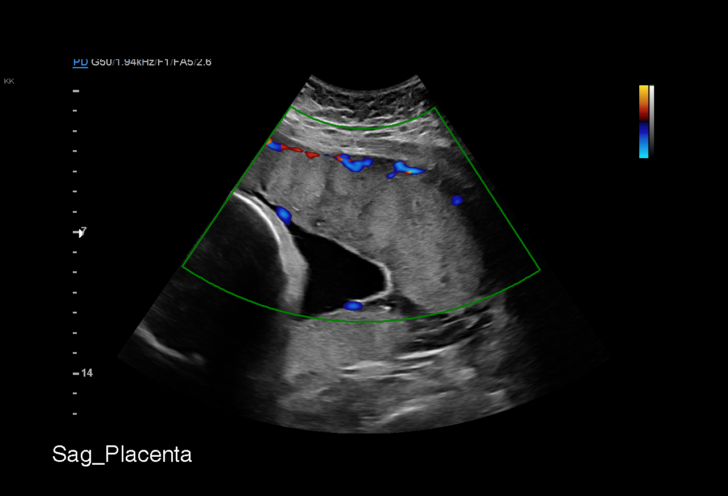
[im 31/37]
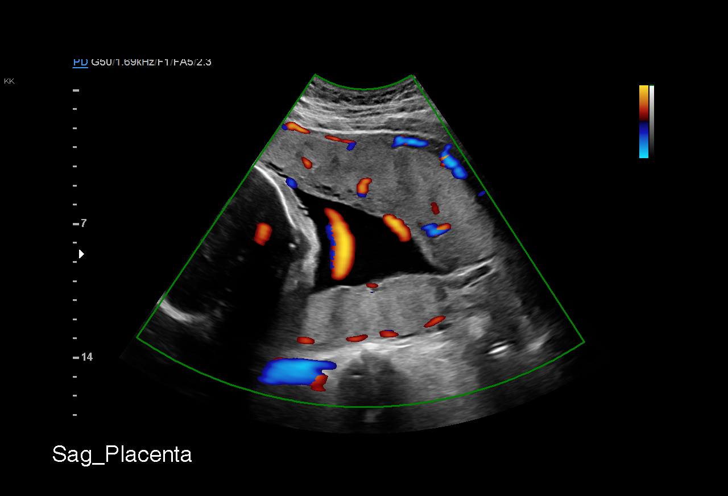
[im 34/37]
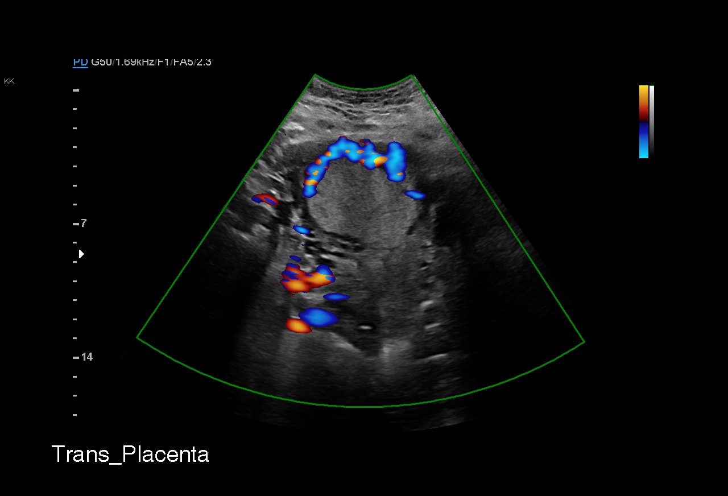
[im 37/37]
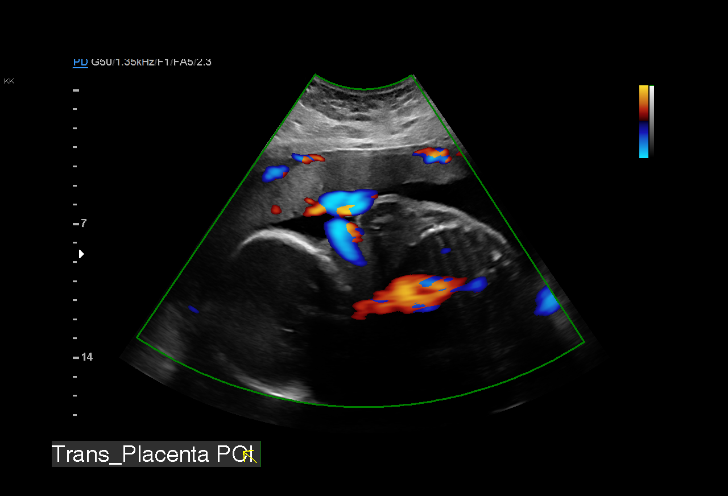

[14 of 28 positions shown; findings below may reference images not displayed]

IMPRESSION: Placenta previa.
Recommendations

 -Fetal growth assessment in 2 weeks.
 -Weekly BPP from 32 weeks till delivery.
 -Delivery at 37 weeks or earlier if she has recurrent bleeding.
                 SAADON

## 2019-11-20 NOTE — Progress Notes (Signed)
Daily Antepartum Note  Admission Date: 11/17/2019 Current Date: 11/20/2019 9:46 AM  Walker Paddack is a 24 y.o. G3P1011 @ [redacted]w[redacted]d, HD#4, admitted for VB #2 in setting of known previa.  Pregnancy complicated by: Patient Active Problem List   Diagnosis Date Noted  . Placenta previa with hemorrhage in third trimester 11/17/2019  . Transient hypertension of pregnancy 10/14/2019  . COVID-19 affecting pregnancy in second trimester 09/25/2019  . Placenta previa 09/09/2019  . History of gestational hypertension 08/22/2019  . Short interval between pregnancies affecting pregnancy, antepartum 08/22/2019  . Late prenatal care 08/22/2019  . BMI 40.0-44.9, adult (HCC) 12/17/2018  . Obesity in pregnancy 12/17/2018  . Supervision of high risk pregnancy, antepartum 12/17/2018  . Rh negative state in antepartum period 12/17/2018    Overnight/24hr events:  none  Subjective:  No bleeding or spotting. No complaints  Last bleed: early evening 2/15, old blood  Objective:    Current Vital Signs 24h Vital Sign Ranges  T 97.7 F (36.5 C) Temp  Avg: 97.7 F (36.5 C)  Min: 97.5 F (36.4 C)  Max: 97.9 F (36.6 C)  BP (!) 98/56 BP  Min: 98/56  Max: 137/76  HR 76 Pulse  Avg: 95.2  Min: 76  Max: 105  RR 18 Resp  Avg: 17.2  Min: 16  Max: 18  SaO2 99 % Room Air SpO2  Avg: 99.2 %  Min: 99 %  Max: 100 %       24 Hour I/O Current Shift I/O  Time Ins Outs No intake/output data recorded. No intake/output data recorded.   Patient Vitals for the past 24 hrs:  BP Temp Temp src Pulse Resp SpO2  11/20/19 0749 (!) 98/56 97.7 F (36.5 C) Oral 76 18 99 %  11/19/19 2226 123/71 97.8 F (36.6 C) Oral 99 18 99 %  11/19/19 1934 125/72 97.7 F (36.5 C) Oral 94 18 100 %  11/19/19 1544 126/71 97.9 F (36.6 C) Oral (!) 105 16 99 %  11/19/19 1158 137/76 (!) 97.5 F (36.4 C) Oral (!) 102 16 99 %   FHT: 140 baseline, +accels, no decel, mod variability Toco: quiet x 62m  Physical exam: General: Well nourished,  well developed female in no acute distress. Abdomen: gravid nttp Respiratory: no resp distress Extremities: no clubbing, cyanosis or edema Skin: Warm and dry.   Medications: Current Facility-Administered Medications  Medication Dose Route Frequency Provider Last Rate Last Admin  . acetaminophen (TYLENOL) tablet 650 mg  650 mg Oral Q4H PRN Constant, Peggy, MD      . calcium carbonate (TUMS - dosed in mg elemental calcium) chewable tablet 400 mg of elemental calcium  2 tablet Oral Q4H PRN Constant, Peggy, MD   400 mg of elemental calcium at 11/19/19 2146  . docusate sodium (COLACE) capsule 100 mg  100 mg Oral BID PRN [place] Bing, MD      . prenatal multivitamin tablet 1 tablet  1 tablet Oral Q1200 Constant, Peggy, MD   1 tablet at 11/19/19 0951  . sodium chloride flush (NS) 0.9 % injection 10-40 mL  10-40 mL Intracatheter Q12H Gem Bing, MD   10 mL at 11/19/19 2146  . sodium chloride flush (NS) 0.9 % injection 10-40 mL  10-40 mL Intracatheter PRN [place] Bing, MD        Labs:  No new labs  Radiology: no new imaging 2/3: 60%, 1178gm, ac 76%, afi normal. Central previa and marginal cord insertion  Assessment & Plan:  Pt doing  well *Pregnancy: daily NSTs. Reactive NSTs *Previa: d/w her that I recommend at least two weeks since this is her 2nd bleed but may do longer. Rpt transvag u/s today.  *Rh neg: s/p rhogam 1/11 *Preterm: rescue bmz 2/14 and 2/15 after BMZ course 1/10-11. Consult NICU prn *PPx: scds *FEN/GI: regular diet, sliv *Dispo: see above  Durene Romans MD Attending Center for [facility])

## 2019-11-21 NOTE — Plan of Care (Signed)
  Problem: Education: Goal: Knowledge of disease or condition will improve Outcome: Completed/Met Goal: Knowledge of the prescribed therapeutic regimen will improve Outcome: Completed/Met   Problem: Health Behavior/Discharge Planning: Goal: Ability to manage health-related needs will improve Outcome: Completed/Met   Problem: Activity: Goal: Risk for activity intolerance will decrease Outcome: Completed/Met   Problem: Nutrition: Goal: Adequate nutrition will be maintained Outcome: Completed/Met   Problem: Coping: Goal: Level of anxiety will decrease Outcome: Completed/Met   Problem: Elimination: Goal: Will not experience complications related to bowel motility Outcome: Completed/Met Goal: Will not experience complications related to urinary retention Outcome: Completed/Met   Problem: Education: Goal: Knowledge of disease or condition will improve Outcome: Completed/Met Goal: Knowledge of the prescribed therapeutic regimen will improve Outcome: Completed/Met   Problem: Clinical Measurements: Goal: Respiratory complications will improve Outcome: Not Applicable Goal: Cardiovascular complication will be avoided Outcome: Not Applicable

## 2019-11-21 NOTE — Progress Notes (Addendum)
Daily Antepartum Note  Admission Date: 11/17/2019 Current Date: 11/21/2019 9:27 AM  Madison Cook is a 24 y.o. G3P1011 @ [redacted]w[redacted]d, HD#5, admitted for VB #2 in setting of known previa.  Pregnancy complicated by: Patient Active Problem List   Diagnosis Date Noted  . Anemia in pregnancy 11/20/2019  . Placenta previa with hemorrhage in third trimester 11/17/2019  . Transient hypertension of pregnancy 10/14/2019  . COVID-19 affecting pregnancy in second trimester 09/25/2019  . Placenta previa 09/09/2019  . History of gestational hypertension 08/22/2019  . Short interval between pregnancies affecting pregnancy, antepartum 08/22/2019  . Late prenatal care 08/22/2019  . BMI 40.0-44.9, adult ([place]) 12/17/2018  . Obesity in pregnancy 12/17/2018  . Supervision of high risk pregnancy, antepartum 12/17/2018  . Rh negative state in antepartum period 12/17/2018    Overnight/24hr events:  none  Subjective:  No bleeding or spotting. No complaints  Last bleed: early evening 2/15, old blood  Objective:    Current Vital Signs 24h Vital Sign Ranges  T 97.9 F (36.6 C) Temp  Avg: 98.1 F (36.7 C)  Min: 97.9 F (36.6 C)  Max: 98.4 F (36.9 C)  BP (!) 141/88 BP  Min: 112/63  Max: 141/88  HR (!) 156 Pulse  Avg: 116.8  Min: 101  Max: 156  RR 16 Resp  Avg: 18.4  Min: 16  Max: 20  SaO2 100 % Room Air SpO2  Avg: 98.2 %  Min: 95 %  Max: 100 %       24 Hour I/O Current Shift I/O  Time Ins Outs No intake/output data recorded. No intake/output data recorded.   Patient Vitals for the past 24 hrs:  BP Temp Temp src Pulse Resp SpO2  11/21/19 0823 (!) 141/88 97.9 F (36.6 C) Oral (!) 156 16 100 %  11/20/19 2304 119/74 98.4 F (36.9 C) Oral (!) 102 18 99 %  11/20/19 1935 122/75 98.3 F (36.8 C) Oral (!) 101 20 99 %  11/20/19 1621 112/63 97.9 F (36.6 C) Oral (!) 111 20 98 %  11/20/19 1200 129/76 98.2 F (36.8 C) Oral (!) 114 18 95 %   FHT: 145 baseline, +accels, no decel, mod variability Toco:  irregular x 60m  Physical exam: General: Well nourished, well developed female in no acute distress. Abdomen: gravid nttp Respiratory: no resp distress Extremities: no clubbing, cyanosis or edema Skin: Warm and dry.   Medications: Current Facility-Administered Medications  Medication Dose Route Frequency Provider Last Rate Last Admin  . acetaminophen (TYLENOL) tablet 650 mg  650 mg Oral Q4H PRN Constant, Peggy, MD   650 mg at 11/20/19 2307  . calcium carbonate (TUMS - dosed in mg elemental calcium) chewable tablet 400 mg of elemental calcium  2 tablet Oral Q4H PRN Constant, Peggy, MD   400 mg of elemental calcium at 11/21/19 0010  . docusate sodium (COLACE) capsule 100 mg  100 mg Oral BID PRN Aletha Halim, MD   100 mg at 11/20/19 2131  . prenatal multivitamin tablet 1 tablet  1 tablet Oral Q1200 Constant, Peggy, MD   1 tablet at 11/20/19 1046  . sodium chloride flush (NS) 0.9 % injection 10-40 mL  10-40 mL Intracatheter Q12H Aletha Halim, MD   10 mL at 11/20/19 2122  . sodium chloride flush (NS) 0.9 % injection 10-40 mL  10-40 mL Intracatheter PRN Aletha Halim, MD        Labs:  No new labs  Radiology:  2/17: central previa (anterior->posterior), marginal cord insertion  2/3: 60%, 1178gm, ac 76%, afi normal. Central previa and marginal cord insertion  Assessment & Plan:  Pt doing well *Pregnancy: daily NSTs. Reactive NSTs *Previa: mfm recs bpp qwk at 32wks. d/w her that I recommend at least two weeks since this is her 2nd bleed but may do longer. *Rh neg: s/p rhogam 1/11. Patient 2 units ofPRBCS at Blood bank due to minor Ab.  *Preterm: rescue bmz 2/14 and 2/15 after BMZ course 1/10-11. Consult NICU *PPx: scds *FEN/GI: regular diet, sliv *Dispo: see above  Cornelia Copa MD Attending Center for [facility])

## 2019-11-21 NOTE — Consult Note (Signed)
[facility]  [facility] 11/21/2019    11:51 AM  Neonatal Medicine Consultation         Monesha Monreal          MRN:  [id number]  I was called at the request of the patient's obstetrician (Dr. Vergie Living) to speak to this patient due to potential premature birth as early as 31 weeks.    The patient's prenatal course includes placenta previa with recently her 2nd vaginal bleeding episode.  She is 30 0/7 weeks currently.  She is admitted to [facility], and is receiving treatment that includes BMZ (initial course 1/10 and 1/11, rescue course 2/14 and 2/15) and management of Rh negative status (has been given Rhogam).  The baby is a female.  Mom was admitted 5 days ago with bleeding.  I reviewed expectations for a baby born at as early as 31 weeks, including survival, length of stay, morbidities such as respiratory distress, infection, feeding intolerance.  I described how we provide respiratory and feeding support.  Mom plans to breast feed so I describe how we assist that approach.    I spent 20 minutes reviewing the record, speaking to the patient, and entering appropriate documentation.  More than 50% of the time was spent face to face with patient.   _____________________ Angelita Ingles, MD Attending Neonatologist

## 2019-11-22 DIAGNOSIS — O360130 Maternal care for anti-D [Rh] antibodies, third trimester, not applicable or unspecified: Secondary | ICD-10-CM

## 2019-11-22 MED ORDER — VITAMIN B-12 1000 MCG PO TABS
1000.0000 ug | ORAL_TABLET | Freq: Every day | ORAL | Status: DC
  Administered 2019-11-22 – 2019-12-01 (×10): 1000 ug via ORAL
  Filled 2019-11-22 (×11): qty 1

## 2019-11-22 MED ORDER — SODIUM CHLORIDE 0.9 % IV SOLN
510.0000 mg | INTRAVENOUS | Status: AC
  Administered 2019-11-22 – 2019-11-29 (×2): 510 mg via INTRAVENOUS
  Filled 2019-11-22 (×2): qty 17

## 2019-11-22 NOTE — Progress Notes (Signed)
Patient ID: Madison Cook, female   DOB: 04/30/1996, 24 y.o.   MRN: [id number] ACULTY PRACTICE ANTEPARTUM COMPREHENSIVE PROGRESS NOTE  Madison Cook is a 24 y.o. G3P1011 at [redacted]w[redacted]d  who is admitted for VB # 2 in setting pf known previa.   Fetal presentation is Transverse. Length of Stay:  5  Days  Subjective: Madison Cook has no complaint this morning. Patient reports good fetal movement.  She reports no uterine contractions, no bleeding and no loss of fluid per vagina.  Vitals:  Blood pressure 140/88, pulse (!) 111, temperature 98.1 F (36.7 C), temperature source Oral, resp. rate 16, height 5\' 3"  (1.6 m), weight 112 kg, last menstrual period 05/21/2019, SpO2 100 %, unknown if currently breastfeeding.   Physical Examination: Lungs clear Heart RRR Abd soft + BS gravid non tender Ext non tender  Fetal Monitoring:  baseline 140's, + accels, no decels, no ut ctx  Labs:  No results found for this or any previous visit (from the past 24 hour(s)).  Imaging Studies:    NA   Medications:  Scheduled . prenatal multivitamin  1 tablet Oral Q1200  . sodium chloride flush  10-40 mL Intracatheter Q12H   I have reviewed the patient's current medications.  ASSESSMENT: IUP 31 4/7 weeks Placenta previa Vaginal bleeding # 2 Rh negative  PLAN: Stable. S/P BMZ. S/P Rhogam 10/14/19. Continue with in patient management Continue routine antenatal care.   12/12/19 11/22/2019,7:04 AM

## 2019-11-23 DIAGNOSIS — O4413 Placenta previa with hemorrhage, third trimester: Principal | ICD-10-CM

## 2019-11-23 DIAGNOSIS — Z3A31 31 weeks gestation of pregnancy: Secondary | ICD-10-CM

## 2019-11-23 LAB — CBC
HCT: 32.4 % — ABNORMAL LOW (ref 36.0–46.0)
Hemoglobin: 10.5 g/dL — ABNORMAL LOW (ref 12.0–15.0)
MCH: 29.5 pg (ref 26.0–34.0)
MCHC: 32.4 g/dL (ref 30.0–36.0)
MCV: 91 fL (ref 80.0–100.0)
Platelets: 188 10*3/uL (ref 150–400)
RBC: 3.56 MIL/uL — ABNORMAL LOW (ref 3.87–5.11)
RDW: 14.1 % (ref 11.5–15.5)
WBC: 21.2 10*3/uL — ABNORMAL HIGH (ref 4.0–10.5)
nRBC: 0.1 % (ref 0.0–0.2)

## 2019-11-23 NOTE — Progress Notes (Signed)
Patient ID: Madison Cook, female   DOB: [birth year], 24 y.o.   MRN: [id number] FACULTY PRACTICE ANTEPARTUM(COMPREHENSIVE) NOTE  Madison Cook is a 24 y.o. G3P1011 with Estimated Date of Delivery: 01/23/20   By   [redacted]w[redacted]d  who is admitted for bleeding with placental previa.    Fetal presentation is unsure. Length of Stay:  6  Days  Date of admission:11/17/2019  Subjective:  Patient reports the fetal movement as active. Patient reports uterine contraction  activity as none. Patient reports  vaginal bleeding as none. Patient describes fluid per vagina as None.  Vitals:  Blood pressure 123/68, pulse 100, temperature 98.6 F (37 C), temperature source Oral, resp. rate 20, height 5\' 3"  (1.6 m), weight 112 kg, last menstrual period 05/21/2019, SpO2 98 %, unknown if currently breastfeeding. Vitals:   11/22/19 2200 11/22/19 2222 11/22/19 2340 11/23/19 0632  BP:  (!) 141/83 131/85 123/68  Pulse:  (!) 112 (!) 105 100  Resp: (!) 22 18 (!) 22 20  Temp:  98.4 F (36.9 C) 98 F (36.7 C) 98.6 F (37 C)  TempSrc:  Oral Oral Oral  SpO2: 97% 97% 97% 98%  Weight:      Height:       Physical Examination:  General appearance - alert, well appearing, and in no distress Abdomen - soft, nontender, nondistended, no masses or organomegaly Fundal Height:  size equals dates Pelvic Exam:  examination not indicated Cervical Exam: Not evaluated. . Extremities: extremities normal, atraumatic, no cyanosis or edema with DTRs 2+ bilaterally Membranes:intact  Fetal Monitoring:  Baseline: 150s bpm, Variability: Good {> 6 bpm), Accelerations: Reactive and Decelerations: Absent   reactive  Labs:  No results found for this or any previous visit (from the past 24 hour(s)).  Imaging Studies:    11/25/19 MFM OB LIMITED  Result Date: 11/20/2019 ----------------------------------------------------------------------  OBSTETRICS REPORT                       (Signed Final 11/20/2019 11:49 am)  ---------------------------------------------------------------------- Patient Info  ID #:       11/22/2019                          D.O.B.:  [birth year] (24 yrs)  Name:       Madison Cook                Visit Date: 11/20/2019 09:46 am ---------------------------------------------------------------------- Performed By  Performed By:     11/22/2019 BS,      Ref. Address:     [address]                                                             [address]  Attending:        Lovett Sox MD        [facility]  Referred By:      [facility] Mila Merry ---------------------------------------------------------------------- Orders   #  Description                          Code         Ordered By   1  [place] MFM OB LIMITED                    [phone]     [place]  ----------------------------------------------------------------------   #  Order #                    Accession #                 Episode #   1  [id number]                  [id number]                  [id number]  ---------------------------------------------------------------------- Indications   Placenta previa with hemorrhage, third         O44.13   trimester   Obesity complicating pregnancy, third          O99.213   trimester   Short interval between pregancies, 3rd         O09.893   trimester   Late to prenatal care, third trimester         O09.33   Poor obstetric history: Previous               O09.299   preeclampsia / eclampsia/gestational HTN   Rh negative state in antepartum                O36.0190   [redacted] weeks gestation of pregnancy                Z3A.29  ---------------------------------------------------------------------- Vital Signs  Weight (lb): 246                               Height:        5'3"  BMI:         43.57 ---------------------------------------------------------------------- Fetal Evaluation  Num Of Fetuses:         1   Fetal Heart Rate(bpm):  140  Cardiac Activity:       Observed  Presentation:           Transverse, head to maternal right  Placenta:               Central previa  P. Cord Insertion:      Marginal insertion  Amniotic Fluid  AFI FV:      Within normal limits  AFI Sum(cm)     %Tile       Largest Pocket(cm)  21.83           88          7.43  RUQ(cm)       RLQ(cm)       LUQ(cm)        LLQ(cm)  4.81          3.74          7.43           5.85 ---------------------------------------------------------------------- OB History  Gravidity:    3         Term:   1  Prem:   0        SAB:   1  TOP:          0       Ectopic:  0        Living: 1 ---------------------------------------------------------------------- Gestational Age  LMP:           26w 1d        Date:  05/21/19                 EDD:   02/25/20  Best:          29w 4d     Det. By:  U/S  (09/04/19)          EDD:   02/01/20 ---------------------------------------------------------------------- Anatomy  Stomach:               Visualized             Bladder:                Visualized ---------------------------------------------------------------------- Cervix Uterus Adnexa  Cervix  Normal appearance by transabdominal scan. ---------------------------------------------------------------------- Impression  Patient with known placenta previa was admitted with vaginal  bleeding 3 days ago.  She had received a course of antenatal  corticosteroids.  Limited ultrasound study was performed.  Amniotic fluid is  normal good fetal activity seen.  Placenta previa covering the  internal os is seen.  Bulk of the placenta is anterior and since  it extends  posteriorly over the internal os transvaginal  evaluation was not performed.  Marginal cord insertion is also seen.  Impression: Placenta previa. ---------------------------------------------------------------------- Recommendations  -Fetal growth assessment in 2 weeks.  -Weekly BPP from 32 weeks till delivery.  -Delivery at 37  weeks or earlier if she has recurrent bleeding. ----------------------------------------------------------------------                  Tama High, MD Electronically Signed Final Report   11/20/2019 11:49 am ----------------------------------------------------------------------    Medications:  Scheduled . prenatal multivitamin  1 tablet Oral Q1200  . sodium chloride flush  10-40 mL Intracatheter Q12H  . vitamin B-12  1,000 mcg Oral Daily   I have reviewed the patient's current medications.  ASSESSMENT: [id number] [redacted]w[redacted]d Estimated Date of Delivery: 01/23/20  Patient Active Problem List   Diagnosis Date Noted  . Anemia in pregnancy 11/20/2019  . Placenta previa with hemorrhage in third trimester 11/17/2019  . Transient hypertension of pregnancy 10/14/2019  . COVID-19 affecting pregnancy in second trimester 09/25/2019  . Placenta previa 09/09/2019  . History of gestational hypertension 08/22/2019  . Short interval between pregnancies affecting pregnancy, antepartum 08/22/2019  . Late prenatal care 08/22/2019  . BMI 40.0-44.9, adult ([place]) 12/17/2018  . Obesity in pregnancy 12/17/2018  . Supervision of high risk pregnancy, antepartum 12/17/2018  . Rh negative state in antepartum period 12/17/2018    PLAN: >continue in house observation until at least 2/28 or possibly remainder of pregnancy >S/P BMZ x 2 2/14,2/15  Mertie Clause Lakelyn Straus 11/23/2019,7:38 AM

## 2019-11-24 LAB — BPAM RBC
Blood Product Expiration Date: 202103242359
Blood Product Expiration Date: 202103242359
Unit Type and Rh: 9500
Unit Type and Rh: 9500

## 2019-11-24 LAB — TYPE AND SCREEN
ABO/RH(D): A NEG
Antibody Screen: POSITIVE
Unit division: 0
Unit division: 0

## 2019-11-24 NOTE — Progress Notes (Signed)
Patient ID: Madison Cook, female   DOB: Feb 24, 1996, 24 y.o.   MRN: [id number] FACULTY PRACTICE ANTEPARTUM(COMPREHENSIVE) NOTE  Madison Cook is a 24 y.o. G3P1011 with Estimated Date of Delivery: 01/23/20   By   [redacted]w[redacted]d  who is admitted for 3rd trimester bleeding from complete placenta previa.    Fetal presentation is unsure. Length of Stay:  7  Days  Date of admission:11/17/2019  Subjective:  Patient reports the fetal movement as active. Patient reports uterine contraction  activity as none. Patient reports  vaginal bleeding as none. Patient describes fluid per vagina as None.  Vitals:  Blood pressure 112/70, pulse (!) 110, temperature 98.1 F (36.7 C), temperature source Oral, resp. rate 18, height 5\' 3"  (1.6 m), weight 112 kg, last menstrual period 05/21/2019, SpO2 97 %, unknown if currently breastfeeding. Vitals:   11/23/19 0844 11/23/19 1239 11/23/19 1702 11/23/19 2317  BP: 119/66 118/77 125/77 112/70  Pulse: 96 (!) 107 (!) 125 (!) 110  Resp:  20 18 18   Temp:  98.4 F (36.9 C) 98 F (36.7 C) 98.1 F (36.7 C)  TempSrc:  Oral Oral Oral  SpO2:  97% 98% 97%  Weight:      Height:       Physical Examination:  General appearance - alert, well appearing, and in no distress Abdomen - soft, nontender, nondistended, no masses or organomegaly Fundal Height:  size equals dates Pelvic Exam:  examination not indicated Cervical Exam: Not evaluated. . Extremities: extremities normal, atraumatic, no cyanosis or edema with DTRs 2+ bilaterally Membranes:intact  Fetal Monitoring:  Baseline: 150s bpm, Variability: Good {> 6 bpm), Accelerations: Reactive and Decelerations: Absent   reactive  Labs:  Results for orders placed or performed during the hospital encounter of 11/17/19 (from the past 24 hour(s))  CBC   Collection Time: 11/23/19 12:05 PM  Result Value Ref Range   WBC 21.2 (H) 4.0 - 10.5 K/uL   RBC 3.56 (L) 3.87 - 5.11 MIL/uL   Hemoglobin 10.5 (L) 12.0 - 15.0 g/dL   HCT 11/19/19  (L) 11/25/19 - 46.0 %   MCV 91.0 80.0 - 100.0 fL   MCH 29.5 26.0 - 34.0 pg   MCHC 32.4 30.0 - 36.0 g/dL   RDW 24.2 68.3 - 41.9 %   Platelets 188 150 - 400 K/uL   nRBC 0.1 0.0 - 0.2 %  Type and screen   Collection Time: 11/23/19 12:15 PM  Result Value Ref Range   ABO/RH(D) A NEG    Antibody Screen POS    Sample Expiration 11/26/2019,2359    Antibody Identification      PASSIVELY ACQUIRED ANTI-D Performed at [facility], [address] [place], [address] [place]    Unit Number [place]    Blood Component Type RED CELLS,LR    Unit division 00    Status of Unit ALLOCATED    Transfusion Status OK TO TRANSFUSE    Crossmatch Result COMPATIBLE    Unit Number [id number]    Blood Component Type RED CELLS,LR    Unit division 00    Status of Unit ALLOCATED    Transfusion Status OK TO TRANSFUSE    Crossmatch Result COMPATIBLE   BPAM RBC   Collection Time: 11/23/19 12:15 PM  Result Value Ref Range   Blood Product Unit Number [id number]    PRODUCT CODE 11/25/19    Unit Type and Rh 9500    Blood Product Expiration Date [id number]    Blood Product Unit Number [id number]  Unit Type and Rh 9500    Blood Product Expiration Date [id number]     Imaging Studies:    [place] MFM OB LIMITED  Result Date: 11/20/2019 ----------------------------------------------------------------------  OBSTETRICS REPORT                       (Signed Final 11/20/2019 11:49 am) ---------------------------------------------------------------------- Patient Info  ID #:       [id number]                          D.O.B.:  Oct 23, 1995 (24 yrs)  Name:       Madison Cook                Visit Date: 11/20/2019 09:46 am ---------------------------------------------------------------------- Performed By  Performed By:     Jeanene Erb BS,      Ref. Address:     [place]  Attending:        Tama High MD         [facility]  Referred By:      [place] ---------------------------------------------------------------------- Orders   #  Description                          Code         Ordered By   1  [place] MFM OB LIMITED                    [phone]     Aletha Halim  ----------------------------------------------------------------------   #  Order #                    Accession #                 Episode #   1  [id number]                  [id number]                  [id number]  ---------------------------------------------------------------------- Indications   Placenta previa with hemorrhage, third         O44.13   trimester   Obesity complicating pregnancy, third          O99.213   trimester   Short interval between pregancies, 3rd         O09.893   trimester   Late to prenatal care, third trimester         O09.33   Poor obstetric history: Previous  O09.299   preeclampsia / eclampsia/gestational HTN   Rh negative state in antepartum                O36.0190   [redacted] weeks gestation of pregnancy                Z3A.29  ---------------------------------------------------------------------- Vital Signs  Weight (lb): 246                               Height:        5'3"  BMI:         43.57 ---------------------------------------------------------------------- Fetal Evaluation  Num Of Fetuses:         1  Fetal Heart Rate(bpm):  140  Cardiac Activity:       Observed  Presentation:           Transverse, head to maternal right  Placenta:               Central previa  P. Cord Insertion:      Marginal insertion  Amniotic Fluid  AFI FV:      Within normal limits  AFI Sum(cm)     %Tile       Largest Pocket(cm)  21.83           88          7.43  RUQ(cm)       RLQ(cm)       LUQ(cm)        LLQ(cm)  4.81          3.74          7.43           5.85 ---------------------------------------------------------------------- OB History   Gravidity:    3         Term:   1        Prem:   0        SAB:   1  TOP:          0       Ectopic:  0        Living: 1 ---------------------------------------------------------------------- Gestational Age  LMP:           26w 1d        Date:  05/21/19                 EDD:   02/25/20  Best:          29w 4d     Det. By:  U/S  (09/04/19)          EDD:   02/01/20 ---------------------------------------------------------------------- Anatomy  Stomach:               Visualized             Bladder:                Visualized ---------------------------------------------------------------------- Cervix Uterus Adnexa  Cervix  Normal appearance by transabdominal scan. ---------------------------------------------------------------------- Impression  Patient with known placenta previa was admitted with vaginal  bleeding 3 days ago.  She had received a course of antenatal  corticosteroids.  Limited ultrasound study was performed.  Amniotic fluid is  normal good fetal activity seen.  Placenta previa covering the  internal os is seen.  Bulk of the placenta is anterior and since  it extends  posteriorly over the internal os transvaginal  evaluation was not performed.  Marginal cord insertion is also seen.  Impression: Placenta previa. ---------------------------------------------------------------------- Recommendations  -Fetal growth assessment in 2 weeks.  -Weekly BPP from 32 weeks till delivery.  -Delivery at 37 weeks or earlier if she has recurrent bleeding. ----------------------------------------------------------------------                  Noralee Space, MD Electronically Signed Final Report   11/20/2019 11:49 am ----------------------------------------------------------------------    Medications:  Scheduled . prenatal multivitamin  1 tablet Oral Q1200  . sodium chloride flush  10-40 mL Intracatheter Q12H  . vitamin B-12  1,000 mcg Oral Daily   I have reviewed the patient's current  medications.  ASSESSMENT: [id number] [redacted]w[redacted]d Estimated Date of Delivery: 01/23/20  Complete prev ia with bleeding Patient Active Problem List   Diagnosis Date Noted  . Anemia in pregnancy 11/20/2019  . Placenta previa with hemorrhage in third trimester 11/17/2019  . Transient hypertension of pregnancy 10/14/2019  . COVID-19 affecting pregnancy in second trimester 09/25/2019  . Placenta previa 09/09/2019  . History of gestational hypertension 08/22/2019  . Short interval between pregnancies affecting pregnancy, antepartum 08/22/2019  . Late prenatal care 08/22/2019  . BMI 40.0-44.9, adult ([facility]) 12/17/2018  . Obesity in pregnancy 12/17/2018  . Supervision of high risk pregnancy, antepartum 12/17/2018  . Rh negative state in antepartum period 12/17/2018    PLAN: >continue in house observation until at least 2/28 or possibly remainder of pregnancy >S/P BMZ x 2 2/14,2/15  Amaryllis Dyke Mustaf Antonacci 11/24/2019,7:23 AM

## 2019-11-25 NOTE — Progress Notes (Signed)
Patient ID: Madison Cook, female   DOB: 01-12-1996, 24 y.o.   MRN: [id number] FACULTY PRACTICE ANTEPARTUM(COMPREHENSIVE) NOTE  Madison Cook is a 24 y.o. G3P1011 with Estimated Date of Delivery: 01/23/20   By   [redacted]w[redacted]d  For in house observation due to third trimester bleeding from complete previa.    Fetal presentation is unsure. Length of Stay:  8  Days  Date of admission:11/17/2019  Subjective:  Patient reports the fetal movement as active. Patient reports uterine contraction  activity as none. Patient reports  vaginal bleeding as none. Patient describes fluid per vagina as None.  Vitals:  Blood pressure 128/64, pulse (!) 127, temperature 98.1 F (36.7 C), temperature source Oral, resp. rate (!) 22, height 5\' 3"  (1.6 m), weight 112 kg, last menstrual period 05/21/2019, SpO2 99 %, unknown if currently breastfeeding. Vitals:   11/24/19 1220 11/24/19 1555 11/24/19 1930 11/24/19 2355  BP: 114/66 124/80 121/72 128/64  Pulse: 99 (!) 107 (!) 109 (!) 127  Resp: 18 18 20  (!) 22  Temp: 98.1 F (36.7 C) 98.1 F (36.7 C) 98.3 F (36.8 C) 98.1 F (36.7 C)  TempSrc: Oral Oral Oral Oral  SpO2: 100% 99% 100% 99%  Weight:      Height:       Physical Examination:  General appearance - alert, well appearing, and in no distress Abdomen - soft, nontender, nondistended, no masses or organomegaly Fundal Height:  size equals dates Pelvic Exam:  examination not indicated Cervical Exam: Not evaluated. . Extremities: extremities normal, atraumatic, no cyanosis or edema with DTRs 2+ bilaterally Membranes:intact  Fetal Monitoring:  Baseline: 140s bpm, Variability: Good {> 6 bpm), Accelerations: Reactive and Decelerations: Absent   reactive  Labs:  No results found for this or any previous visit (from the past 24 hour(s)).  Imaging Studies:    No results found.   Medications:  Scheduled . prenatal multivitamin  1 tablet Oral Q1200  . sodium chloride flush  10-40 mL Intracatheter Q12H  .  vitamin B-12  1,000 mcg Oral Daily   I have reviewed the patient's current medications.  ASSESSMENT: 11/26/19 [redacted]w[redacted]d Estimated Date of Delivery: 01/23/20  3rd trimester bleeding, second episode, due to cental placenta previa Patient Active Problem List   Diagnosis Date Noted  . Anemia in pregnancy 11/20/2019  . Placenta previa with hemorrhage in third trimester 11/17/2019  . Transient hypertension of pregnancy 10/14/2019  . COVID-19 affecting pregnancy in second trimester 09/25/2019  . Placenta previa 09/09/2019  . History of gestational hypertension 08/22/2019  . Short interval between pregnancies affecting pregnancy, antepartum 08/22/2019  . Late prenatal care 08/22/2019  . BMI 40.0-44.9, adult ([facility]) 12/17/2018  . Obesity in pregnancy 12/17/2018  . Supervision of high risk pregnancy, antepartum 12/17/2018  . Rh negative state in antepartum period 12/17/2018    PLAN:  No change in management plan >continue in house observation until at least 2/28 or possibly remainder of pregnancy >S/P BMZ x 2 2/14,2/15   3/28 Jaymes Hang 11/25/2019,7:07 AM

## 2019-11-25 NOTE — Progress Notes (Signed)
Initial Nutrition Assessment  DOCUMENTATION CODES:  Morbid obesity  INTERVENTION:  Regular diet Snacks and double protein portions if pts requests  NUTRITION DIAGNOSIS:  Increased nutrient needs related to (pregancy and fetal growth requirements) as evidenced by (31 weeks IUP).  GOAL:  Patient will meet greater than or equal to 90% of their needs  MONITOR:  Weight trends  REASON FOR ASSESSMENT:  Antenatal   ASSESSMENT:  31 4/7 weeks, adm due to placenta previa. WT at initial prenatal visit/ 18 weeks: 226 Lbs. BMI 40.2.  20 lb weight gain since   Diet Order:   Diet Order            Diet regular Room service appropriate? Yes; Fluid consistency: Thin  Diet effective now              EDUCATION NEEDS:   No education needs have been identified at this time  Skin:  Skin Assessment: Reviewed RN Assessment  Height:   Ht Readings from Last 1 Encounters:  11/17/19 5\' 3"  (1.6 m)    Weight:   Wt Readings from Last 1 Encounters:  11/17/19 112 kg    Ideal Body Weight:   115 lbs  BMI:  Body mass index is 43.74 kg/m.  Estimated Nutritional Needs:   Kcal:  2100-2300  Protein:  95-105 g  Fluid:  2.4 L    11/19/19 M.M LDN Neonatal Nutrition Support Specialist/RD III

## 2019-11-26 LAB — CBC
HCT: 31.9 % — ABNORMAL LOW (ref 36.0–46.0)
Hemoglobin: 10 g/dL — ABNORMAL LOW (ref 12.0–15.0)
MCH: 29.7 pg (ref 26.0–34.0)
MCHC: 31.3 g/dL (ref 30.0–36.0)
MCV: 94.7 fL (ref 80.0–100.0)
Platelets: 269 10*3/uL (ref 150–400)
RBC: 3.37 MIL/uL — ABNORMAL LOW (ref 3.87–5.11)
RDW: 14.8 % (ref 11.5–15.5)
WBC: 21.6 10*3/uL — ABNORMAL HIGH (ref 4.0–10.5)
nRBC: 0.1 % (ref 0.0–0.2)

## 2019-11-26 NOTE — Progress Notes (Signed)
Patient ID: Madison Cook, female   DOB: 02-Mar-1996, 24 y.o.   MRN: [id number] [place]) NOTE  Madison Cook is a 24 y.o. G3P1011 with Estimated Date of Delivery: 01/23/20   By   [redacted]w[redacted]d  For in house observation due to third trimester bleeding from complete previa.    Fetal presentation is unsure. Length of Stay:  9  Days  Date of admission:11/17/2019  Subjective:  Patient reports the fetal movement as active. Patient reports uterine contraction  activity as none. Patient reports  vaginal bleeding as none. Patient describes fluid per vagina as None.  Vitals:  Blood pressure 111/61, pulse (!) 116, temperature 98.4 F (36.9 C), temperature source Oral, resp. rate 20, height 5\' 3"  (1.6 m), weight 112 kg, last menstrual period 05/21/2019, SpO2 99 %, unknown if currently breastfeeding. Vitals:   11/25/19 1800 11/25/19 1806 11/25/19 1932 11/25/19 2325  BP:  136/73 99/63 111/61  Pulse:  (!) 120 (!) 117 (!) 116  Resp:  17 18 20   Temp: 97.8 F (36.6 C)  98.4 F (36.9 C) 98.4 F (36.9 C)  TempSrc: Oral  Oral Oral  SpO2:  99% 99% 99%  Weight:      Height:       Physical Examination:  General appearance - alert, well appearing, and in no distress Abdomen - soft, nontender, nondistended, no masses or organomegaly Fundal Height:  size equals dates Pelvic Exam:  examination not indicated Cervical Exam: Not evaluated. . Extremities: extremities normal, atraumatic, no cyanosis or edema with DTRs 2+ bilaterally Membranes:intact  Fetal Monitoring:  Baseline: 140s bpm, Variability: Good {> 6 bpm), Accelerations: Reactive and Decelerations: Absent   reactive  Labs:  Results for orders placed or performed during the hospital encounter of 11/17/19 (from the past 24 hour(s))  CBC   Collection Time: 11/26/19  5:43 AM  Result Value Ref Range   WBC 21.6 (H) 4.0 - 10.5 K/uL   RBC 3.37 (L) 3.87 - 5.11 MIL/uL   Hemoglobin 10.0 (L) 12.0 - 15.0 g/dL   HCT 31.9 (L)  36.0 - 46.0 %   MCV 94.7 80.0 - 100.0 fL   MCH 29.7 26.0 - 34.0 pg   MCHC 31.3 30.0 - 36.0 g/dL   RDW 14.8 11.5 - 15.5 %   Platelets 269 150 - 400 K/uL   nRBC 0.1 0.0 - 0.2 %  Type and screen [place]   Collection Time: 11/26/19  5:43 AM  Result Value Ref Range   ABO/RH(D) A NEG    Antibody Screen POS    Sample Expiration 11/29/2019,2359    Antibody Identification      PASSIVELY ACQUIRED ANTI-D Performed at [facility], [address] [place], [place] [address]     Imaging Studies:    No results found.   Medications:  Scheduled . prenatal multivitamin  1 tablet Oral Q1200  . sodium chloride flush  10-40 mL Intracatheter Q12H  . vitamin B-12  1,000 mcg Oral Daily   I have reviewed the patient's current medications.  ASSESSMENT: [id number] [redacted]w[redacted]d Estimated Date of Delivery: 01/23/20  3rd trimester bleeding, second episode, due to cental placenta previa Patient Active Problem List   Diagnosis Date Noted  . Anemia in pregnancy 11/20/2019  . Placenta previa with hemorrhage in third trimester 11/17/2019  . Transient hypertension of pregnancy 10/14/2019  . COVID-19 affecting pregnancy in second trimester 09/25/2019  . Placenta previa 09/09/2019  . History of gestational hypertension 08/22/2019  . Short interval  between pregnancies affecting pregnancy, antepartum 08/22/2019  . Late prenatal care 08/22/2019  . BMI 40.0-44.9, adult (HCC) 12/17/2018  . Obesity in pregnancy 12/17/2018  . Supervision of high risk pregnancy, antepartum 12/17/2018  . Rh negative state in antepartum period 12/17/2018    PLAN:  Status unchanged/No change in management plan >continue in house observation until at least 2/28 or possibly remainder of pregnancy >S/P BMZ x 2 2/14,2/15   Amaryllis Dyke Bradlee Bridgers 11/26/2019,7:51 AM    Patient ID: Madison Cook, female   DOB: 29-Nov-1995, 24 y.o.   MRN: [id number]

## 2019-11-27 LAB — TYPE AND SCREEN
ABO/RH(D): A NEG
Antibody Screen: POSITIVE
Unit division: 0
Unit division: 0

## 2019-11-27 LAB — BPAM RBC
Blood Product Expiration Date: 202103092359
Blood Product Expiration Date: 202103112359
Unit Type and Rh: 9500
Unit Type and Rh: 9500

## 2019-11-27 NOTE — Progress Notes (Signed)
Patient ID: Madison Cook, female   DOB: 1996-04-24, 24 y.o.   MRN: [id number] [facility] ANTEPARTUM COMPREHENSIVE PROGRESS NOTE  Madison Cook is a 24 y.o. G3P1011 at [redacted]w[redacted]d  who is admitted for second episode of vaginal bleeding due to placenta previa.  EDD of 01/23/20 Fetal presentation is unsure. Length of Stay:  10  Days  Subjective: Pt reports no new complaints today. Good fetal movement. No vaginal bleeding or LOF. No ut ctx.  Vitals:  Blood pressure 121/64, pulse (!) 120, temperature 97.6 F (36.4 C), temperature source Oral, resp. rate 17, height 5\' 3"  (1.6 m), weight 112 kg, last menstrual period 05/21/2019, SpO2 100 %, unknown if currently breastfeeding.   Physical Examination: Alert, in NAD Lungs clear Heart RRR Abd soft + BS gravid non tender Pelvic deferred Ext non tender  Fetal Monitoring:  Baseline 140's with acels, no decels  Labs:  No results found for this or any previous visit (from the past 24 hour(s)).  Imaging Studies:    NA   Medications:  Scheduled . prenatal multivitamin  1 tablet Oral Q1200  . sodium chloride flush  10-40 mL Intracatheter Q12H  . vitamin B-12  1,000 mcg Oral Daily   I have reviewed the patient's current medications.  ASSESSMENT: Patient Active Problem List   Diagnosis Date Noted  . Anemia in pregnancy 11/20/2019  . Placenta previa with hemorrhage in third trimester 11/17/2019  . Transient hypertension of pregnancy 10/14/2019  . COVID-19 affecting pregnancy in second trimester 09/25/2019  . Placenta previa 09/09/2019  . History of gestational hypertension 08/22/2019  . Short interval between pregnancies affecting pregnancy, antepartum 08/22/2019  . Late prenatal care 08/22/2019  . BMI 40.0-44.9, adult (HCC) 12/17/2018  . Obesity in pregnancy 12/17/2018  . Supervision of high risk pregnancy, antepartum 12/17/2018  . Rh negative state in antepartum period 12/17/2018    PLAN: Stable. No new vaginal bleeding. S/P BMZ x  2. In house observation until Sunday at least Continue routine antenatal care.   Thursday 11/27/2019,9:37 AM

## 2019-11-27 NOTE — Plan of Care (Signed)
  Problem: Clinical Measurements: Goal: Complications related to the disease process, condition or treatment will be avoided or minimized Outcome: Progressing   

## 2019-11-28 DIAGNOSIS — Z3A32 32 weeks gestation of pregnancy: Secondary | ICD-10-CM

## 2019-11-28 NOTE — Progress Notes (Signed)
Patient ID: Madison Cook, female   DOB: [birth year], 24 y.o.   MRN: [id number] [facility] PRACTICE ANTEPARTUM COMPREHENSIVE PROGRESS NOTE  Madison Cook is a 24 y.o. G3P1011 at [redacted]w[redacted]d  who is admitted for second episode of vaginal bleeding d/t placenta previa. EDD 01/23/20 .   Fetal presentation is unsure. Length of Stay:  11  Days  Subjective: No new complaints this morning. Good fetal movement. No vaginal bleeding, LOF or ut ctx.    Vitals:  Blood pressure 125/73, pulse 99, temperature 98 F (36.7 C), temperature source Oral, resp. rate 18, height 5\' 3"  (1.6 m), weight 112 kg, last menstrual period 05/21/2019, SpO2 99 %, unknown if currently breastfeeding.   Physical Examination: Alert in NAD Lungs clear Heart RR Abd soft + BS gravid, non tender Pelvic deferred Ext non tender  Fetal Monitoring:  Baseline 130's, + accels, no decels or ut ctx  Labs:  No results found for this or any previous visit (from the past 24 hour(s)).  Imaging Studies:    NA   Medications:  Scheduled . prenatal multivitamin  1 tablet Oral Q1200  . sodium chloride flush  10-40 mL Intracatheter Q12H  . vitamin B-12  1,000 mcg Oral Daily   I have reviewed the patient's current medications.  ASSESSMENT: Patient Active Problem List   Diagnosis Date Noted  . Anemia in pregnancy 11/20/2019  . Placenta previa with hemorrhage in third trimester 11/17/2019  . Transient hypertension of pregnancy 10/14/2019  . COVID-19 affecting pregnancy in second trimester 09/25/2019  . Placenta previa 09/09/2019  . History of gestational hypertension 08/22/2019  . Short interval between pregnancies affecting pregnancy, antepartum 08/22/2019  . Late prenatal care 08/22/2019  . BMI 40.0-44.9, adult (HCC) 12/17/2018  . Obesity in pregnancy 12/17/2018  . Supervision of high risk pregnancy, antepartum 12/17/2018  . Rh negative state in antepartum period 12/17/2018    PLAN: Stable. S/P BMZ x 2. Deliver for maternal/fetal  indications Continue routine antenatal care.   12/19/2018 11/28/2019,10:05 AM

## 2019-11-28 NOTE — Plan of Care (Signed)
  Problem: Clinical Measurements: Goal: Complications related to the disease process, condition or treatment will be avoided or minimized Outcome: Progressing   Pt states no new bleeding, old, brownish/red blood noted on peripad

## 2019-11-29 LAB — CBC
HCT: 34.2 % — ABNORMAL LOW (ref 36.0–46.0)
Hemoglobin: 10.8 g/dL — ABNORMAL LOW (ref 12.0–15.0)
MCH: 29.6 pg (ref 26.0–34.0)
MCHC: 31.6 g/dL (ref 30.0–36.0)
MCV: 93.7 fL (ref 80.0–100.0)
Platelets: 267 10*3/uL (ref 150–400)
RBC: 3.65 MIL/uL — ABNORMAL LOW (ref 3.87–5.11)
RDW: 15.8 % — ABNORMAL HIGH (ref 11.5–15.5)
WBC: 18.7 10*3/uL — ABNORMAL HIGH (ref 4.0–10.5)
nRBC: 0 % (ref 0.0–0.2)

## 2019-11-29 LAB — TYPE AND SCREEN
ABO/RH(D): A NEG
ABO/RH(D): A NEG
Antibody Screen: POSITIVE
Antibody Screen: POSITIVE

## 2019-11-29 NOTE — Progress Notes (Signed)
Patient ID: Madison Cook, female   DOB: June 13, 1996, 24 y.o.   MRN: [id number] ACULT[facility] PRACTICE ANTEPARTUM COMPREHENSIVE PROGRESS NOTE  Rondia Higginbotham is a 24 y.o. G3P1011 at [redacted]w[redacted]d  who is admitted for second episode of vaginal bleeding d/t placenta previa. EDD 01/23/20.   Fetal presentation is unsure. Length of Stay:  12  Days  Subjective: No complaints this morning Patient reports good fetal movement.  She reports no uterine contractions, no bleeding and no loss of fluid per vagina.  Vitals:  Blood pressure 109/72, pulse 100, temperature 97.6 F (36.4 C), resp. rate 18, height 5\' 3"  (1.6 m), weight 112 kg, last menstrual period 05/21/2019, SpO2 100 %, unknown if currently breastfeeding.   Physical Examination: Alert, in NAD Lungs clear Heart RRR Abd soft + BS gravid non tender Ext non tender  Fetal Monitoring:  Baseline 130's, + acels, no decels or ut ctx  Labs:  No results found for this or any previous visit (from the past 24 hour(s)).  Imaging Studies:    NA   Medications:  Scheduled . prenatal multivitamin  1 tablet Oral Q1200  . sodium chloride flush  10-40 mL Intracatheter Q12H  . vitamin B-12  1,000 mcg Oral Daily   I have reviewed the patient's current medications.  ASSESSMENT: Patient Active Problem List   Diagnosis Date Noted  . Anemia in pregnancy 11/20/2019  . Placenta previa with hemorrhage in third trimester 11/17/2019  . Transient hypertension of pregnancy 10/14/2019  . COVID-19 affecting pregnancy in second trimester 09/25/2019  . Placenta previa 09/09/2019  . History of gestational hypertension 08/22/2019  . Short interval between pregnancies affecting pregnancy, antepartum 08/22/2019  . Late prenatal care 08/22/2019  . BMI 40.0-44.9, adult (HCC) 12/17/2018  . Obesity in pregnancy 12/17/2018  . Supervision of high risk pregnancy, antepartum 12/17/2018  . Rh negative state in antepartum period 12/17/2018    PLAN: Stable. S/P BMZ x 2 Continue  routine antenatal care.   12/19/2018 11/29/2019,9:52 AM

## 2019-11-30 NOTE — Plan of Care (Signed)
  Problem: Clinical Measurements: Goal: Complications related to the disease process, condition or treatment will be avoided or minimized Outcome: Progressing   

## 2019-11-30 NOTE — Progress Notes (Signed)
[place]) NOTE  Madison Cook is a 24 y.o. G3P1011 at [redacted]w[redacted]d  who is admitted for central previa, second bleeding episode, has been hospitalized 13 d w/o bleeding this time. Pt reports she was told she would go home at 2/28.Marland Kitchen   Fetal presentation is unsure. Length of Stay:  13  Days  Subjective: No contractions no bleeding Patient reports the fetal movement as active. Patient reports uterine contraction  activity as none. Patient reports  vaginal bleeding as none. Patient describes fluid per vagina as None.  Vitals:  Blood pressure 128/78, pulse (!) 106, temperature 97.8 F (36.6 C), temperature source Oral, resp. rate 20, height 5\' 3"  (1.6 m), weight 112 kg, last menstrual period 05/21/2019, SpO2 100 %, unknown if currently breastfeeding. Physical Examination:  General appearance - alert, well appearing, and in no distress and overweight Heart - normal rate and regular rhythm Abdomen - soft, nontender, nondistended Fundal Height:  size equals dates Cervical Exam: Not evaluated. Extremities: extremities normal, atraumatic, no cyanosis or edema and Homans sign is negative, no sign of DVT with DTRs 2+ bilaterally Membranes:intact  Fetal Monitoring:  Baseline: 135 bpm, Variability: Good {> 6 bpm), Accelerations: Reactive and Decelerations: Absent  Labs:  Results for orders placed or performed during the hospital encounter of 11/17/19 (from the past 24 hour(s))  CBC   Collection Time: 11/29/19  9:05 AM  Result Value Ref Range   WBC 18.7 (H) 4.0 - 10.5 K/uL   RBC 3.65 (L) 3.87 - 5.11 MIL/uL   Hemoglobin 10.8 (L) 12.0 - 15.0 g/dL   HCT 34.2 (L) 36.0 - 46.0 %   MCV 93.7 80.0 - 100.0 fL   MCH 29.6 26.0 - 34.0 pg   MCHC 31.6 30.0 - 36.0 g/dL   RDW 15.8 (H) 11.5 - 15.5 %   Platelets 267 150 - 400 K/uL   nRBC 0.0 0.0 - 0.2 %  Type and screen   Collection Time: 11/29/19  9:05 AM  Result Value Ref Range   ABO/RH(D) A NEG    Antibody Screen POS    Sample Expiration 12/02/2019,2359    Antibody Identification      PASSIVELY ACQUIRED ANTI-D Performed at [facility], [address], [place], [place] [address]     Imaging Studies:     Currently EPIC will not allow sonographic studies to automatically populate into notes.  In the meantime, copy and paste results into note or free text.  Medications:  Scheduled . prenatal multivitamin  1 tablet Oral Q1200  . sodium chloride flush  10-40 mL Intracatheter Q12H  . vitamin B-12  1,000 mcg Oral Daily   I have reviewed the patient's current medications.  ASSESSMENT: Patient Active Problem List   Diagnosis Date Noted  . Anemia in pregnancy 11/20/2019  . Placenta previa with hemorrhage in third trimester 11/17/2019  . Transient hypertension of pregnancy 10/14/2019  . COVID-19 affecting pregnancy in second trimester 09/25/2019  . Placenta previa 09/09/2019  . History of gestational hypertension 08/22/2019  . Short interval between pregnancies affecting pregnancy, antepartum 08/22/2019  . Late prenatal care 08/22/2019  . BMI 40.0-44.9, adult ([place]) 12/17/2018  . Obesity in pregnancy 12/17/2018  . Supervision of high risk pregnancy, antepartum 12/17/2018  . Rh negative state in antepartum period 12/17/2018    PLAN:  Stable. S/P BMZ x 2 Continue routine antenatal care. Consider d/c Sunday. Jonnie Kind 11/30/2019,7:43 AM    Patient ID: Madison Cook, female   DOB: 19-Jan-1996, 24 y.o.  MRN: [id number]

## 2019-11-30 NOTE — Plan of Care (Signed)
  Problem: Clinical Measurements: Goal: Ability to maintain clinical measurements within normal limits will improve Outcome: Progressing Goal: Will remain free from infection Outcome: Progressing   

## 2019-12-01 MED ORDER — CYANOCOBALAMIN 1000 MCG PO TABS: 1000.0000 ug | ORAL_TABLET | Freq: Every day | ORAL | 0 refills | Status: DC

## 2019-12-01 NOTE — Discharge Instructions (Signed)
Placenta Previa Placenta previa is a condition in which the placenta implants in the lower part of the uterus in pregnant women. The placenta either partially or completely covers the opening to the cervix. This is a problem because the baby must pass through the cervix during delivery. There are three types of placenta previa:  Marginal placenta previa. The placenta reaches within an inch (2.5 cm) of the cervical opening but does not cover it.  Partial placenta previa. The placenta covers part of the cervical opening.  Complete placenta previa. The placenta covers the entire cervical opening. If the previa is marginal or partial and it is diagnosed in the first half of pregnancy, the placenta may move into a normal position as the pregnancy progresses and may no longer cover the cervix. It is important to keep all prenatal visits with your health care provider so you can be more closely monitored. What are the causes? The cause of this condition is not known. What increases the risk? This condition is more likely to develop in women who:  Are carrying more than one baby (multiples).  Have an abnormally shaped uterus.  Have scars on the lining of the uterus.  Have had surgeries involving the uterus, such as a cesarean delivery.  Have delivered a baby before.  Have a history of placenta previa.  Have smoked or used cocaine during pregnancy.  Are age 35 or older during pregnancy. What are the signs or symptoms? The main symptom of this condition is sudden, painless vaginal bleeding during the second half of pregnancy. The amount of bleeding can be very light at first, and it usually stops on its own. Heavier bleeding episodes may also happen. Some women with placenta previa may have no bleeding at all. How is this diagnosed?  This condition is diagnosed: ? From an ultrasound. This test uses sound waves to find where the placenta is located before you have any bleeding  episodes. ? During a checkup after vaginal bleeding is noticed.  If you are diagnosed with a partial or complete previa, digital exams with fingers will generally be avoided. Your health care provider will still perform a speculum exam.  If you did not have an ultrasound during your pregnancy, placenta previa may not be diagnosed until bleeding occurs during labor. How is this treated? Treatment for this condition may include:  Decreased activity.  Bed rest at home or in the hospital.  Pelvic rest. Nothing is placed inside the vagina during pelvic rest. This means not having sex and not using tampons or douches.  A blood transfusion to replace blood that you have lost (maternal blood loss).  A cesarean delivery. This may be performed if: ? The bleeding is heavy and cannot be controlled. ? The placenta completely covers the cervix.  Medicines to stop premature labor or to help the baby's lungs to mature. This treatment may be used if you need delivery before your pregnancy is full-term. Your treatment will be decided based on:  How much you are bleeding, or whether the bleeding has stopped.  How far along you are in your pregnancy.  The condition of your baby.  The type of placenta previa that you have.  Follow these instructions at home:  Get plenty of rest and lessen activity as told by your health care provider.  Stay on bed rest for as long as told by your health care provider.  Do not have sex, use tampons, use a douche, or place anything inside of   your vagina if your health care provider recommended pelvic rest.  Take over-the-counter and prescription medicines as told by your health care provider.  Keep all follow-up visits as told by your health care provider. This is important. Get help right away if:  You have vaginal bleeding, even if in small amounts and even if you have no pain.  You have cramping or regular contractions.  You have pain in your abdomen or  your lower back.  You have a feeling of increased pressure in your pelvis.  You have increased watery or bloody mucus from the vagina. This information is not intended to replace advice given to you by your health care provider. Make sure you discuss any questions you have with your health care provider. Document Revised: 09/01/2017 Document Reviewed: 04/02/2016 Elsevier Patient Education  2020 [employer]  

## 2019-12-01 NOTE — Discharge Summary (Signed)
Patient ID: Madison Cook MRN: [id number] DOB/AGE: [birth year] 23 y.o.  Admit date: 11/17/2019 Discharge date: 12/01/2019  Admission Diagnoses:Central placenta previa with bleeding  Discharge Diagnoses: same  Prenatal Procedures: ultrasound  Consults: Neonatology, Maternal Fetal Medicine  Hospital Course:  This is a 24 y.o. G3P1011 with IUP at [redacted]w[redacted]d admitted for vaginal bleeding with placenta previa.  She was seen by Neonatology during her stay.  She was observed, fetal heart rate monitoring remained reassuring, and she had no signs/symptoms of progressing preterm labor or other maternal-fetal concerns. She had no bleeding for more than 7 days. She was deemed stable for discharge to home with outpatient follow up.  Discharge Exam: Temp:  [97.7 F (36.5 C)-98.3 F (36.8 C)] 97.7 F (36.5 C) (02/28 0733) Pulse Rate:  [106-129] 118 (02/28 0733) Resp:  [18] 18 (02/28 0733) BP: (104-127)/(64-86) 113/72 (02/28 0733) SpO2:  [97 %-100 %] 100 % (02/28 0733) Physical Examination: CONSTITUTIONAL: Well-developed, well-nourished female in no acute distress.  HENT:  Normocephalic, atraumatic, External right and left ear normal. Oropharynx is clear and moist EYES: Conjunctivae and EOM are normal. Pupils are equal, round, and reactive to light. No scleral icterus.  NECK: Normal range of motion, supple, no masses SKIN: Skin is warm and dry. No rash noted. Not diaphoretic. No erythema. No pallor. [place]: Alert and oriented to person, place, and time. Normal reflexes, muscle tone coordination. No cranial nerve deficit noted. PSYCHIATRIC: Normal mood and affect. Normal behavior. Normal judgment and thought content. CARDIOVASCULAR: Normal heart rate noted, regular rhythm RESPIRATORY: Effort and breath sounds normal, no problems with respiration noted MUSCULOSKELETAL: Normal range of motion. No edema and no tenderness. 2+ distal pulses. ABDOMEN: Soft, nontender, nondistended, gravid. CERVIX:       Significant Diagnostic Studies:  Results for orders placed or performed during the hospital encounter of 11/17/19 (from the past 168 hour(s))  CBC   Collection Time: 11/26/19  5:43 AM  Result Value Ref Range   WBC 21.6 (H) 4.0 - 10.5 K/uL   RBC 3.37 (L) 3.87 - 5.11 MIL/uL   Hemoglobin 10.0 (L) 12.0 - 15.0 g/dL   HCT 31.9 (L) 36.0 - 46.0 %   MCV 94.7 80.0 - 100.0 fL   MCH 29.7 26.0 - 34.0 pg   MCHC 31.3 30.0 - 36.0 g/dL   RDW 14.8 11.5 - 15.5 %   Platelets 269 150 - 400 K/uL   nRBC 0.1 0.0 - 0.2 %  Type and screen [place]   Collection Time: 11/26/19  5:43 AM  Result Value Ref Range   ABO/RH(D) A NEG    Antibody Screen POS    Sample Expiration 11/28/2019,2359    Antibody Identification      PASSIVELY ACQUIRED ANTI-D Performed at [facility], [address] [place], [place] [address]   CBC   Collection Time: 11/29/19  9:05 AM  Result Value Ref Range   WBC 18.7 (H) 4.0 - 10.5 K/uL   RBC 3.65 (L) 3.87 - 5.11 MIL/uL   Hemoglobin 10.8 (L) 12.0 - 15.0 g/dL   HCT 34.2 (L) 36.0 - 46.0 %   MCV 93.7 80.0 - 100.0 fL   MCH 29.6 26.0 - 34.0 pg   MCHC 31.6 30.0 - 36.0 g/dL   RDW 15.8 (H) 11.5 - 15.5 %   Platelets 267 150 - 400 K/uL   nRBC 0.0 0.0 - 0.2 %  Type and screen   Collection Time: 11/29/19  9:05 AM  Result Value Ref Range  ABO/RH(D) A NEG    Antibody Screen POS    Sample Expiration 12/02/2019,2359    Antibody Identification      PASSIVELY ACQUIRED ANTI-D Performed at [facility], [address] [place], [place] [address]    Fetal Heart Rate A  Mode External filed at 12/01/2019 0801  Baseline Rate (A) 130 bpm filed at 12/01/2019 0801  Variability 6-25 BPM filed at 12/01/2019 0801  Accelerations 15 x 15 filed at 12/01/2019 0801  Decelerations Variable filed at 12/01/2019 0801    Discharge Condition: Stable  Disposition: Discharge disposition: 01-Home or Self Care        Discharge Instructions    Discharge patient    Complete by: As directed    Discharge disposition: 01-Home or Self Care   Discharge patient date: 12/01/2019     Allergies as of 12/01/2019   No Known Allergies     Medication List    TAKE these medications   acetaminophen 325 MG tablet Commonly known as: TYLENOL Take 650 mg by mouth every 6 (six) hours as needed for mild pain or headache.   albuterol 108 (90 Base) MCG/ACT inhaler Commonly known as: VENTOLIN HFA Inhale 2 puffs into the lungs every 6 (six) hours as needed for wheezing or shortness of breath.   aspirin EC 81 MG tablet Take 1 tablet (81 mg total) by mouth daily.   calcium carbonate 500 MG chewable tablet Commonly known as: TUMS - dosed in mg elemental calcium Chew 3-5 tablets by mouth 5 (five) times daily as needed for indigestion or heartburn.   cyanocobalamin 1000 MCG tablet Take 1 tablet (1,000 mcg total) by mouth daily.   docusate sodium 100 MG capsule Commonly known as: COLACE Take 1 capsule (100 mg total) by mouth 2 (two) times daily as needed for mild constipation.   polyethylene glycol 17 g packet Commonly known as: MIRALAX / GLYCOLAX Take 17 g by mouth daily.   PrePLUS 27-1 MG Tabs Take 1 tablet by mouth daily.      Follow-up Information    [facility] Follow up in 2 week(s).   Specialty: Obstetrics and Gynecology Contact information: [address] [place] [address] [phone]          Signed: Scheryl Darter M.D. 12/01/2019, 8:38 AM

## 2019-12-01 NOTE — Progress Notes (Signed)
Pt discharged via wheelchair after discharge instructions given. All questions answered. IV discontinued. Pt in stable condition and sent with all belongings.

## 2019-12-12 NOTE — Patient Instructions (Signed)
Madison Cook  12/12/2019   Your procedure is scheduled on:  12/26/2019  Arrive at 1030 at Entrance C on [employer] [address] at [facility]  and [employer]. You are invited to use the FREE valet parking or use the Visitor's parking deck.  Pick up the phone at the desk and dial [phone].  Call this number if you have problems the morning of surgery: [phone]  Remember:   Do not eat food:(After Midnight) Desps de medianoche.  Do not drink clear liquids: (After Midnight) Desps de medianoche.  Take these medicines the morning of surgery with A SIP OF WATER:  none   Do not wear jewelry, make-up or nail polish.  Do not wear lotions, powders, or perfumes. Do not wear deodorant.  Do not shave 48 hours prior to surgery.  Do not bring valuables to the hospital.  [facility] is not   responsible for any belongings or valuables brought to the hospital.  Contacts, dentures or bridgework may not be worn into surgery.  Leave suitcase in the car. After surgery it may be brought to your room.  For patients admitted to the hospital, checkout time is 11:00 AM the day of              discharge.      Please read over the following fact sheets that you were given:     Preparing for Surgery

## 2019-12-18 ENCOUNTER — Encounter (HOSPITAL_COMMUNITY): Payer: Self-pay

## 2019-12-18 ENCOUNTER — Other Ambulatory Visit (HOSPITAL_COMMUNITY): Payer: Self-pay | Admitting: Obstetrics

## 2019-12-18 ENCOUNTER — Ambulatory Visit (HOSPITAL_COMMUNITY)
Admission: RE | Admit: 2019-12-18 | Discharge: 2019-12-18 | Disposition: A | Discharge status: Home / Self Care | Payer: Commercial Managed Care - PPO | Source: Ambulatory Visit | Attending: Obstetrics and Gynecology | Admitting: Obstetrics and Gynecology

## 2019-12-18 ENCOUNTER — Other Ambulatory Visit: Payer: Self-pay

## 2019-12-18 ENCOUNTER — Ambulatory Visit (HOSPITAL_COMMUNITY): Payer: Commercial Managed Care - PPO | Admitting: *Deleted

## 2019-12-18 ENCOUNTER — Encounter (HOSPITAL_COMMUNITY): Payer: Self-pay | Admitting: *Deleted

## 2019-12-18 DIAGNOSIS — O4403 Placenta previa specified as without hemorrhage, third trimester: Secondary | ICD-10-CM | POA: Diagnosis not present

## 2019-12-18 DIAGNOSIS — O98512 Other viral diseases complicating pregnancy, second trimester: Secondary | ICD-10-CM | POA: Diagnosis present

## 2019-12-18 DIAGNOSIS — O0933 Supervision of pregnancy with insufficient antenatal care, third trimester: Secondary | ICD-10-CM | POA: Diagnosis not present

## 2019-12-18 DIAGNOSIS — O99213 Obesity complicating pregnancy, third trimester: Secondary | ICD-10-CM | POA: Diagnosis not present

## 2019-12-18 DIAGNOSIS — U071 COVID-19: Secondary | ICD-10-CM | POA: Diagnosis present

## 2019-12-18 DIAGNOSIS — Z3A33 33 weeks gestation of pregnancy: Secondary | ICD-10-CM

## 2019-12-18 DIAGNOSIS — E669 Obesity, unspecified: Secondary | ICD-10-CM | POA: Diagnosis not present

## 2019-12-18 DIAGNOSIS — O09293 Supervision of pregnancy with other poor reproductive or obstetric history, third trimester: Secondary | ICD-10-CM

## 2019-12-18 DIAGNOSIS — O360130 Maternal care for anti-D [Rh] antibodies, third trimester, not applicable or unspecified: Secondary | ICD-10-CM

## 2019-12-18 IMAGING — US US MFM OB FOLLOW-UP
1 series · 13 of 27 positions shown · non-contrast
Comparison: none

[Series 1: us mfm ob follow-up · 27 acquisitions, 13 frames shown]
[im 2/27]
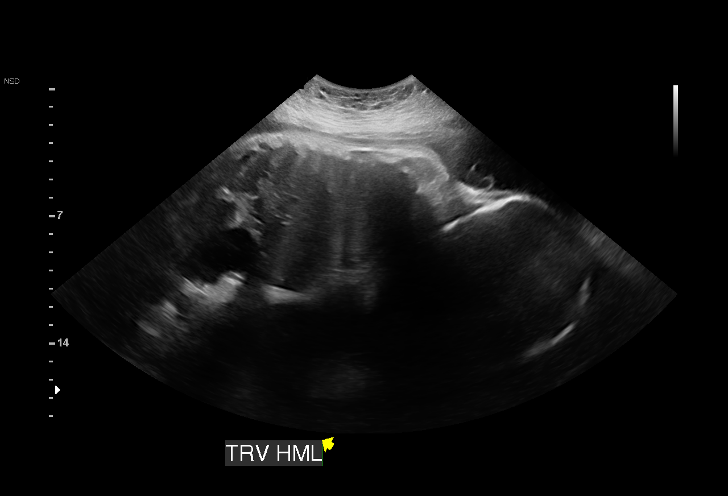
[im 4/27]
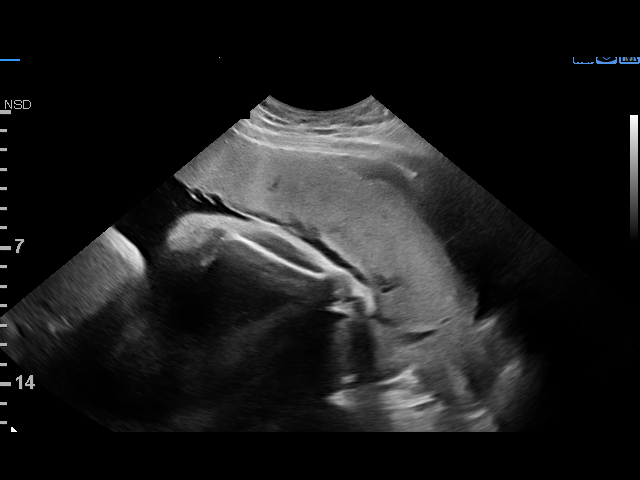
[im 6/27]
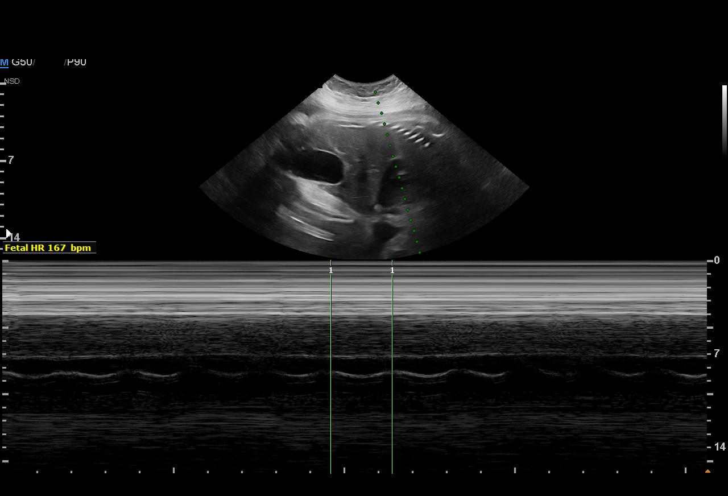
[im 8/27]
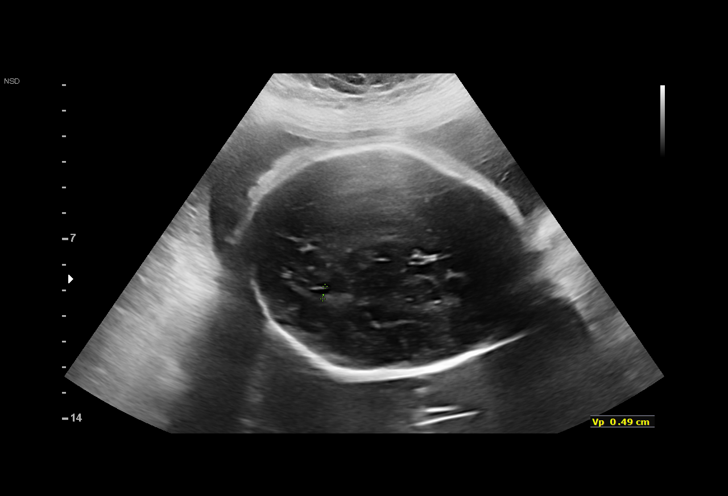
[im 10/27]
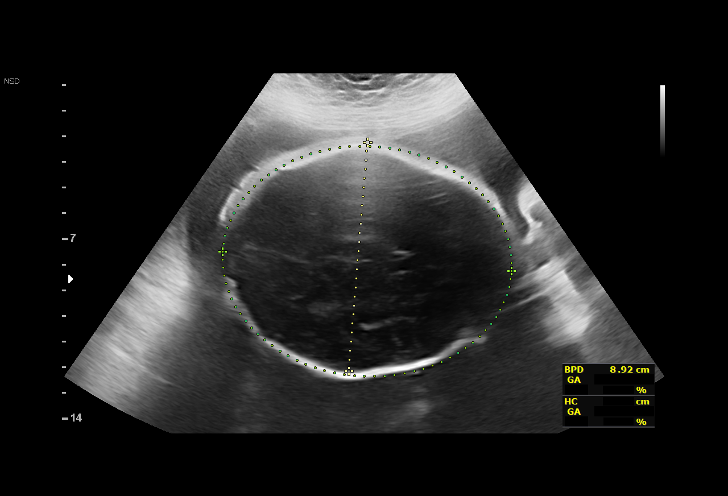
[im 12/27]
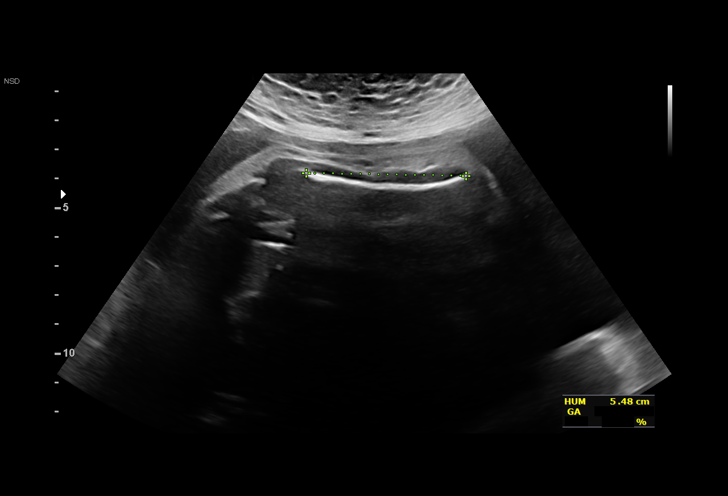
[im 14/27]
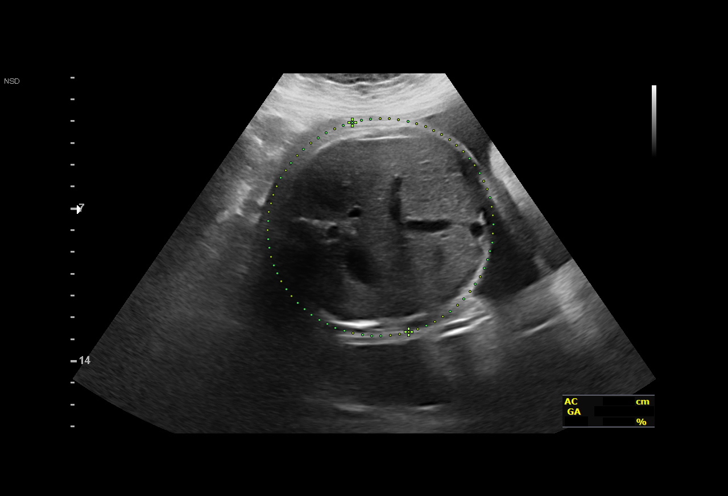
[im 16/27]
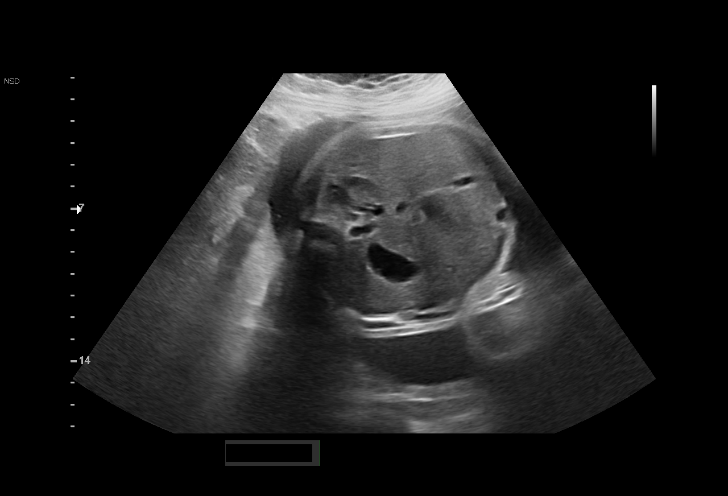
[im 18/27]
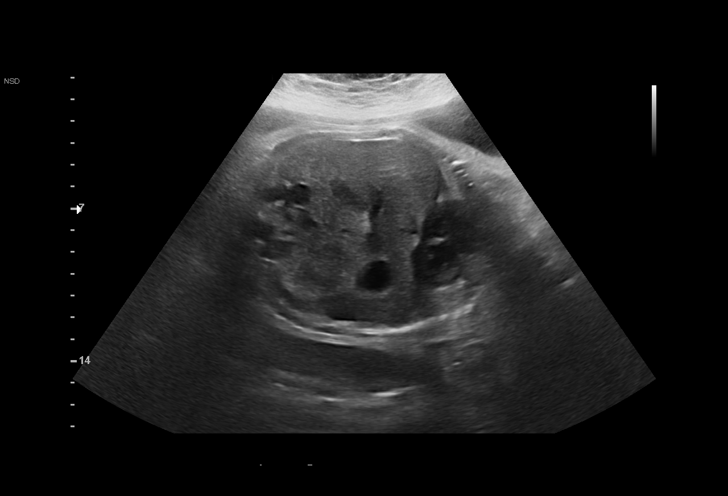
[im 20/27]
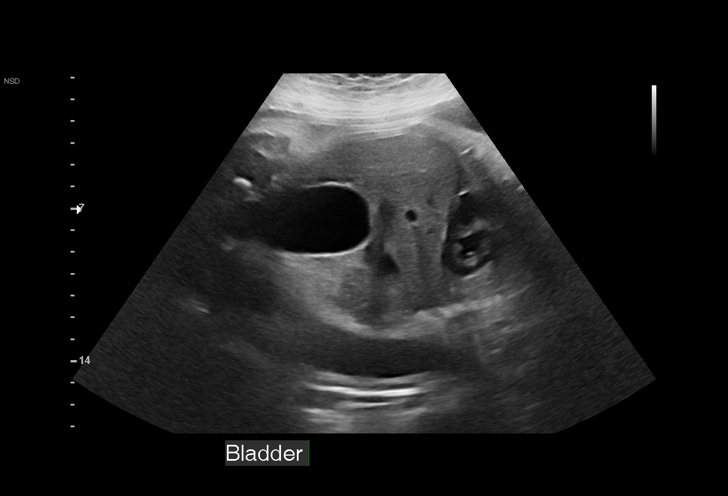
[im 22/27]
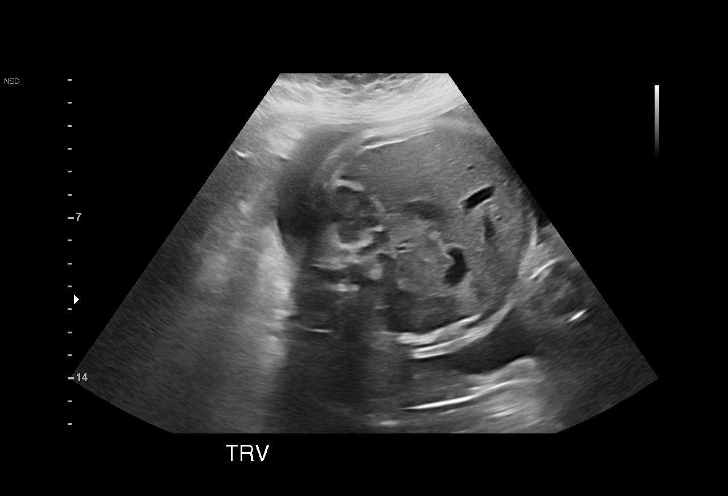
[im 24/27]
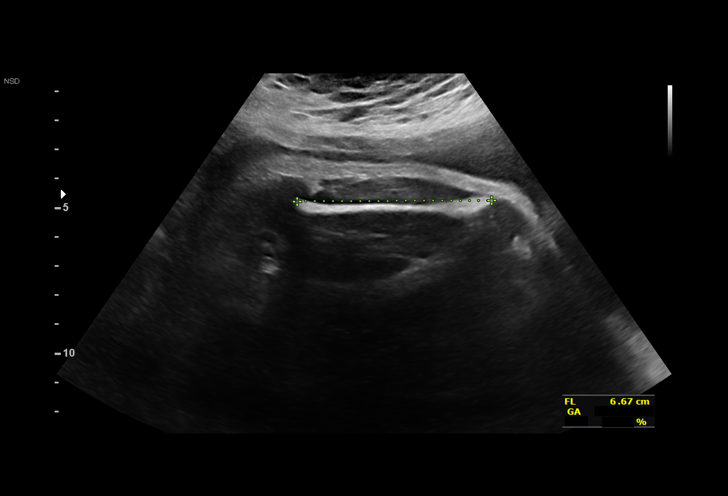
[im 26/27]
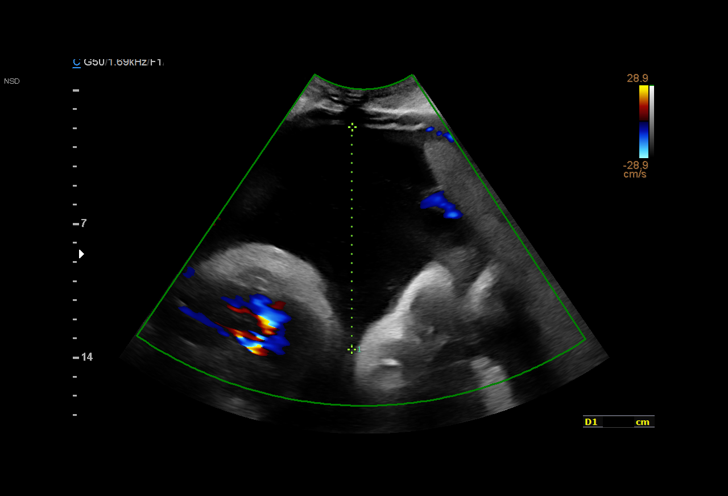

[13 of 27 positions shown; findings below may reference images not displayed]

----------------------------------------------------------------------

 ----------------------------------------------------------------------
Indications

  Placenta previa with hemorrhage, third         [MZ]
  trimester
  33 weeks gestation of pregnancy
  Obesity complicating pregnancy, third          [MZ]
  trimester
  Short interval between pregancies, 3rd         [MZ]
  trimester
  Late to prenatal care, third trimester         [MZ]
  Poor obstetric history: Previous               [MZ]
  preeclampsia / eclampsia/gestational HTN
  Rh negative state in antepartum                [MZ]
 ----------------------------------------------------------------------
Vital Signs

                                                Height:        5'3"
Fetal Evaluation

 Num Of Fetuses:         1
 Fetal Heart Rate(bpm):  167
 Cardiac Activity:       Observed
 Presentation:           Transverse, head to maternal left
 Placenta:               Central previa
 P. Cord Insertion:      Marginal insertion

 Amniotic Fluid
 AFI FV:      Polyhydramnios

 AFI Sum(cm)     %Tile       Largest Pocket(cm)
 27.[REDACTED]
 RUQ(cm)       RLQ(cm)       LUQ(cm)        LLQ(cm)

Biophysical Evaluation

 Amniotic F.V:   Pocket => 2 cm             F. Tone:        Observed
 F. Movement:    Observed                   Score:          [DATE]
 F. Breathing:   Observed
Biometry

 BPD:      88.6  mm     G. Age:  35w 6d         95  %    CI:        77.71   %    70 - 86
                                                         FL/HC:      20.7   %    19.4 -
 HC:      318.1  mm     G. Age:  35w 6d         71  %    HC/AC:      0.98        0.96 -
 AC:      323.2  mm     G. Age:  36w 2d         98  %    FL/BPD:     74.5   %    71 - 87
 FL:         66  mm     G. Age:  34w 0d         51  %    FL/AC:      20.4   %    20 - 24
 HUM:      54.4  mm     G. Age:  31w 4d         19  %

 LV:        4.9  mm

 Est. FW:    [MZ]  gm           6 lb     93  %
OB History

 Gravidity:    3         Term:   1        Prem:   0        SAB:   1
 TOP:          0       Ectopic:  0        Living: 1
Gestational Age

 LMP:           30w 1d        Date:  [DATE]                 EDD:   [DATE]
 U/S Today:     35w 4d                                        EDD:   [DATE]
 Best:          33w 4d     Det. By:  U/S  ([DATE])          EDD:   [DATE]
Anatomy

 Cranium:               Appears normal         LVOT:                   Previously seen
 Cavum:                 Previously seen        Aortic Arch:            Not well visualized
 Ventricles:            Appears normal         Ductal Arch:            Not well visualized
 Choroid Plexus:        Previously seen        Diaphragm:              Appears normal
 Cerebellum:            Previously seen        Stomach:                Appears normal, left
                                                                       sided
 Posterior Fossa:       Previously seen        Abdomen:                Appears normal
 Nuchal Fold:           Previously seen        Abdominal Wall:         Previously seen
 Face:                  Orbits and profile     Cord Vessels:           Previously seen
                        previously seen
 Lips:                  Previously seen        Kidneys:                Appear normal
 Palate:                Not well visualized    Bladder:                Appears normal
 Thoracic:              Appears normal         Spine:                  Previously seen
 Heart:                 Previously seen        Upper Extremities:      Previously seen
 RVOT:                  Previously seen        Lower Extremities:      Previously seen

 Other:  Female gender prev seen.  Nasal bone, Heels and 5th digit prev
         visualized. Technically difficult due to maternal habitus and fetal
         position.
Cervix Uterus Adnexa

 Cervix
 Not visualized (advanced GA >[MZ])
Impression

 Patient with placenta previa returned for fetal growth
 assessment. She does not have vaginal bleeding now. She
 was admitted in [REDACTED] following vaginal bleeding.

 Her EDD of [DATE] was established by first ultrasound
 performed at our office on [DATE] and the GA was 18w 4d.
 Earlier ultrasound dating performed on [DATE] should not
 be considered for calculating EDD as only BPD
 measurements were taken.

 Her pregnancy is 33w 4d today.

 On ultrasound, mild polyhydramnios was seen (AFI=27 cm).
 Good fetal activity is seen. The estimated fetal weight is at
 the 93rd percentile. Antenatal testing is reassuring. BPP [DATE].
 Placenta previa is seen again.

 I counseled the patient on the findings, EDD calculation and
 timing of delivery.
 We recommend delivery at 37 weeks or at 36 weeks because
 of history of vaginal bleeding and previous hospital
 admissions. Patient's wishes should be considered.

 I also informed her that polyhydramnios may lead to uterine
 contractions and vaginal bleeding.

 She does not have gestational diabetes.
 I discussed with Dr. MASATAFA who will be seeing her tomorrow.
Recommendations

 -Follow-up scans as clnically indicated.
 -Delivery at 36 or 37 weeks (EDD [DATE]).
                 MASATAFA

## 2019-12-19 ENCOUNTER — Other Ambulatory Visit (HOSPITAL_COMMUNITY)
Admission: RE | Admit: 2019-12-19 | Discharge: 2019-12-19 | Disposition: A | Discharge status: Home / Self Care | Payer: Commercial Managed Care - PPO | Source: Ambulatory Visit | Attending: Obstetrics and Gynecology | Admitting: Obstetrics and Gynecology

## 2019-12-19 ENCOUNTER — Ambulatory Visit (INDEPENDENT_AMBULATORY_CARE_PROVIDER_SITE_OTHER): Payer: Commercial Managed Care - PPO | Admitting: Obstetrics and Gynecology

## 2019-12-19 VITALS — BP 128/84 | HR 94 | Wt 252.0 lb

## 2019-12-19 DIAGNOSIS — U071 COVID-19: Secondary | ICD-10-CM

## 2019-12-19 DIAGNOSIS — Z6841 Body Mass Index (BMI) 40.0 and over, adult: Secondary | ICD-10-CM

## 2019-12-19 DIAGNOSIS — O3663X0 Maternal care for excessive fetal growth, third trimester, not applicable or unspecified: Secondary | ICD-10-CM

## 2019-12-19 DIAGNOSIS — O26899 Other specified pregnancy related conditions, unspecified trimester: Secondary | ICD-10-CM

## 2019-12-19 DIAGNOSIS — O0990 Supervision of high risk pregnancy, unspecified, unspecified trimester: Secondary | ICD-10-CM

## 2019-12-19 DIAGNOSIS — O403XX0 Polyhydramnios, third trimester, not applicable or unspecified: Secondary | ICD-10-CM

## 2019-12-19 DIAGNOSIS — O4403 Placenta previa specified as without hemorrhage, third trimester: Secondary | ICD-10-CM

## 2019-12-19 DIAGNOSIS — Z6791 Unspecified blood type, Rh negative: Secondary | ICD-10-CM

## 2019-12-19 DIAGNOSIS — R768 Other specified abnormal immunological findings in serum: Secondary | ICD-10-CM

## 2019-12-19 DIAGNOSIS — O98512 Other viral diseases complicating pregnancy, second trimester: Secondary | ICD-10-CM

## 2019-12-19 DIAGNOSIS — O99013 Anemia complicating pregnancy, third trimester: Secondary | ICD-10-CM

## 2019-12-19 DIAGNOSIS — O3662X0 Maternal care for excessive fetal growth, second trimester, not applicable or unspecified: Secondary | ICD-10-CM | POA: Insufficient documentation

## 2019-12-19 DIAGNOSIS — O09899 Supervision of other high risk pregnancies, unspecified trimester: Secondary | ICD-10-CM

## 2019-12-19 DIAGNOSIS — R7689 Other specified abnormal immunological findings in serum: Secondary | ICD-10-CM | POA: Insufficient documentation

## 2019-12-19 DIAGNOSIS — O43199 Other malformation of placenta, unspecified trimester: Secondary | ICD-10-CM

## 2019-12-19 DIAGNOSIS — O99210 Obesity complicating pregnancy, unspecified trimester: Secondary | ICD-10-CM

## 2019-12-19 HISTORY — DX: Polyhydramnios, third trimester, not applicable or unspecified: O40.3XX0

## 2019-12-19 HISTORY — DX: Maternal care for excessive fetal growth, third trimester, not applicable or unspecified: O36.63X0

## 2019-12-19 NOTE — Progress Notes (Signed)
Prenatal Visit Note Date: 12/19/2019 Clinic: [facility]  Subjective:  Madison Cook is a 24 y.o. G3P1011 at [redacted]w[redacted]d being seen today for ongoing prenatal care.  She is currently monitored for the following issues for this high-risk pregnancy and has BMI 40.0-44.9, adult (HCC); Obesity in pregnancy; Supervision of high risk pregnancy, antepartum; Rh negative state in antepartum period; History of gestational hypertension; Short interval between pregnancies affecting pregnancy, antepartum; Late prenatal care; Placenta previa; Marginal insertion of umbilical cord affecting management of mother; COVID-19 affecting pregnancy in second trimester; Transient hypertension of pregnancy; Anemia in pregnancy; Red blood cell antibody positive; Excessive fetal growth affecting management of mother in third trimester, antepartum; and Polyhydramnios in third trimester on their problem list.  Patient reports no complaints.   Contractions: Irritability. Vag. Bleeding: None.  Movement: Present. Denies leaking of fluid.   The following portions of the patient's history were reviewed and updated as appropriate: allergies, current medications, past family history, past medical history, past social history, past surgical history and problem list. Problem list updated.  Objective:   Vitals:   12/19/19 1435  BP: 128/84  Pulse: 94  Weight: 252 lb (114.3 kg)    Fetal Status: Fetal Heart Rate (bpm): 137   Movement: Present     General:  Alert, oriented and cooperative. Patient is in no acute distress.  Skin: Skin is warm and dry. No rash noted.   Cardiovascular: Normal heart rate noted  Respiratory: Normal respiratory effort, no problems with respiration noted  Abdomen: Soft, gravid, appropriate for gestational age. Pain/Pressure: Absent     Pelvic:  Cervical exam deferred        Extremities: Normal range of motion.  Edema: None  Mental Status: Normal mood and affect. Normal behavior.  Normal judgment and thought content.   Urinalysis:      Assessment and Plan:  Pregnancy: G3P1011 at [redacted]w[redacted]d  1. BMI 40.0-44.9, adult (HCC)  2. Obesity in pregnancy  3. Supervision of high risk pregnancy, antepartum Routine care On 12/18/19, mfm realized dates in epic is not the same edc they were using. In epic, edc used during pregnancy was an 18wk in office u/s with bpd and fl for edc of 4/22-->35/0 today, but mfm was using edc of 5/1 based on 12/2 anatomy u/s-->33/5 today. Dates changed and 36/0 would be for 4/3. Pt prefers 4/5 date (see below) for delivery - Cervicovaginal ancillary only( [facility]) - Strep Gp B NAA  4. Rh negative state in antepartum period S/p rhogam on 1/11. Rpt pp prn  5. Anemia during pregnancy in third trimester S/p IV iron on 2/19  6. Red blood cell antibody positive Type and cross on hospital admit  7. COVID-19 affecting pregnancy in second trimester No sequelae  8. Placenta previa in third trimester 36-37wk delivery. Pt prefers the Monday following easter. Pt aware of risk of spontaneous labor especially with LGA, poly fetus  9. Short interval between pregnancies affecting pregnancy, antepartum  10. Excessive fetal growth affecting management of pregnancy in third trimester, single or unspecified fetus Normal GTT, followed by mfm  11. Polyhydramnios in third trimester complication, single or unspecified fetus See above  12. Marginal insertion of umbilical cord affecting management of mother  Preterm labor symptoms and general obstetric precautions including but not limited to vaginal bleeding, contractions, leaking of fluid and fetal movement were reviewed in detail with the patient. Please refer to After Visit Summary for other counseling recommendations.  Return in about 1  week (around 12/26/2019) for high risk, virtual visit.   Aletha Halim, MD

## 2019-12-21 LAB — STREP GP B NAA: Strep Gp B NAA: POSITIVE — AB

## 2019-12-23 ENCOUNTER — Encounter: Payer: Self-pay | Admitting: Obstetrics and Gynecology

## 2019-12-23 LAB — CERVICOVAGINAL ANCILLARY ONLY
Chlamydia: NEGATIVE
Comment: NEGATIVE
Comment: NEGATIVE
Comment: NORMAL
Neisseria Gonorrhea: NEGATIVE
Trichomonas: NEGATIVE

## 2019-12-24 ENCOUNTER — Other Ambulatory Visit (HOSPITAL_COMMUNITY)
Admission: RE | Admit: 2019-12-24 | Discharge: 2019-12-24 | Disposition: A | Discharge status: Home / Self Care | Payer: Commercial Managed Care - PPO | Source: Ambulatory Visit | Attending: Obstetrics and Gynecology | Admitting: Obstetrics and Gynecology

## 2019-12-24 ENCOUNTER — Encounter (HOSPITAL_COMMUNITY): Payer: Self-pay

## 2019-12-24 NOTE — Patient Instructions (Addendum)
Madison Cook  12/24/2019   Your procedure is scheduled on:  01/06/2020  Arrive at 0800 at Entrance C on [address] at [facility]  and [address]. You are invited to use the FREE valet parking or use the Visitor's parking deck.  Pick up the phone at the desk and dial [phone].  Call this number if you have problems the morning of surgery: [phone]  Remember:   Do not eat food:(After Midnight) Desps de medianoche.  Do not drink clear liquids: (After Midnight) Desps de medianoche.  Take these medicines the morning of surgery with A SIP OF WATER:  none   Do not wear jewelry, make-up or nail polish.  Do not wear lotions, powders, or perfumes. Do not wear deodorant.  Do not shave 48 hours prior to surgery.  Do not bring valuables to the hospital.  [facility] is not   responsible for any belongings or valuables brought to the hospital.  Contacts, dentures or bridgework may not be worn into surgery.  Leave suitcase in the car. After surgery it may be brought to your room.  For patients admitted to the hospital, checkout time is 11:00 AM the day of              discharge.      Please read over the following fact sheets that you were given:     Preparing for Surgery

## 2019-12-26 ENCOUNTER — Encounter: Payer: Self-pay | Admitting: Family Medicine

## 2019-12-26 ENCOUNTER — Telehealth (INDEPENDENT_AMBULATORY_CARE_PROVIDER_SITE_OTHER): Payer: Commercial Managed Care - PPO | Admitting: Family Medicine

## 2019-12-26 ENCOUNTER — Other Ambulatory Visit: Payer: Self-pay

## 2019-12-26 DIAGNOSIS — Z8616 Personal history of COVID-19: Secondary | ICD-10-CM

## 2019-12-26 DIAGNOSIS — U071 COVID-19: Secondary | ICD-10-CM

## 2019-12-26 DIAGNOSIS — O0990 Supervision of high risk pregnancy, unspecified, unspecified trimester: Secondary | ICD-10-CM

## 2019-12-26 DIAGNOSIS — O4403 Placenta previa specified as without hemorrhage, third trimester: Secondary | ICD-10-CM

## 2019-12-26 DIAGNOSIS — O98512 Other viral diseases complicating pregnancy, second trimester: Secondary | ICD-10-CM

## 2019-12-26 DIAGNOSIS — O98513 Other viral diseases complicating pregnancy, third trimester: Secondary | ICD-10-CM

## 2019-12-26 DIAGNOSIS — Z3A34 34 weeks gestation of pregnancy: Secondary | ICD-10-CM

## 2019-12-26 DIAGNOSIS — O133 Gestational [pregnancy-induced] hypertension without significant proteinuria, third trimester: Secondary | ICD-10-CM

## 2019-12-26 NOTE — Patient Instructions (Signed)

## 2019-12-26 NOTE — Progress Notes (Signed)
OBSTETRICS PRENATAL VIRTUAL VISIT ENCOUNTER NOTE  Provider location: [facility] at [facility]   I connected with Madison Cook on 12/26/19 at 11:45 AM EDT by MyChart Video Encounter at home and verified that I am speaking with the correct person using two identifiers.   I discussed the limitations, risks, security and privacy concerns of performing an evaluation and management service virtually and the availability of in person appointments. I also discussed with the patient that there may be a patient responsible charge related to this service. The patient expressed understanding and agreed to proceed. Subjective:  Madison Cook is a 24 y.o. G3P1011 at [redacted]w[redacted]d being seen today for ongoing prenatal care.  She is currently monitored for the following issues for this high-risk pregnancy and has BMI 40.0-44.9, adult (HCC); Obesity in pregnancy; Supervision of high risk pregnancy, antepartum; Rh negative state in antepartum period; GBS (group B Streptococcus carrier), +RV culture, currently pregnant; History of gestational hypertension; Short interval between pregnancies affecting pregnancy, antepartum; Late prenatal care; Placenta previa; Marginal insertion of umbilical cord affecting management of mother; COVID-19 affecting pregnancy in second trimester; Transient hypertension of pregnancy; Anemia in pregnancy; Red blood cell antibody positive; Excessive fetal growth affecting management of mother in third trimester, antepartum; and Polyhydramnios in third trimester on their problem list.  Patient reports no complaints.  Contractions: Not present. Vag. Bleeding: None.  Movement: Present. Denies any leaking of fluid.   The following portions of the patient's history were reviewed and updated as appropriate: allergies, current medications, past family history, past medical history, past social history, past surgical history and problem list.   Objective:  There were no vitals  filed for this visit.  Fetal Status:     Movement: Present     General:  Alert, oriented and cooperative. Patient is in no acute distress.  Respiratory: Normal respiratory effort, no problems with respiration noted  Mental Status: Normal mood and affect. Normal behavior. Normal judgment and thought content.  Rest of physical exam deferred due to type of encounter  Imaging: [place] MFM FETAL BPP WO NON STRESS  Result Date: 12/18/2019 ----------------------------------------------------------------------  OBSTETRICS REPORT                       (Signed Final 12/18/2019 03:21 pm) ---------------------------------------------------------------------- Patient Info  ID #:       [id number]                          D.O.B.:  [birth year] (23 yrs)  Name:       Madison Cook                Visit Date: 12/18/2019 02:09 pm ---------------------------------------------------------------------- Performed By  Performed By:     Earley Brooke     Ref. Address:     [address]                    [place]                                                             [address]  Attending:        Noralee Space MD        [facility]  Referred By:      [facility] Mila Merry ---------------------------------------------------------------------- Orders   #  Description                          Code         Ordered By   1  [place] MFM OB FOLLOW UP                  [phone]     Rosana Hoes   2  [place] MFM FETAL BPP WO NON              76819.01     YU FANG      STRESS  ----------------------------------------------------------------------   #  Order #                    Accession #                 Episode #   1  [id number]                  [id number]   2  [id number]                  [id number]  ---------------------------------------------------------------------- Indications   Placenta previa with  hemorrhage, third         O44.13   trimester   [redacted] weeks gestation of pregnancy                Z3A.33   Obesity complicating pregnancy, third          O99.213   trimester   Short interval between pregancies, 3rd         O09.893   trimester   Late to prenatal care, third trimester         O09.33   Poor obstetric history: Previous               O09.299   preeclampsia / eclampsia/gestational HTN   Rh negative state in antepartum                O36.0190  ---------------------------------------------------------------------- Vital Signs                                                 Height:        5'3" ---------------------------------------------------------------------- Fetal Evaluation  Num Of Fetuses:         1  Fetal Heart Rate(bpm):  167  Cardiac Activity:       Observed  Presentation:           Transverse, head to maternal left  Placenta:               Central previa  P. Cord Insertion:  Marginal insertion  Amniotic Fluid  AFI FV:      Polyhydramnios  AFI Sum(cm)     %Tile       Largest Pocket(cm)  27.35           97          11.6  RUQ(cm)       RLQ(cm)       LUQ(cm)        LLQ(cm)  4.34          5.8           11.6           5.61 ---------------------------------------------------------------------- Biophysical Evaluation  Amniotic F.V:   Pocket => 2 cm             F. Tone:        Observed  F. Movement:    Observed                   Score:          8/8  F. Breathing:   Observed ---------------------------------------------------------------------- Biometry  BPD:      88.6  mm     G. Age:  35w 6d         95  %    CI:        77.71   %    70 - 86                                                          FL/HC:      20.7   %    19.4 - 21.8  HC:      318.1  mm     G. Age:  35w 6d         71  %    HC/AC:      0.98        0.96 - 1.11  AC:      323.2  mm     G. Age:  36w 2d         98  %    FL/BPD:     74.5   %    71 - 87  FL:         66  mm     G. Age:  34w 0d         51  %    FL/AC:      20.4   %    20 - 24  HUM:       54.4  mm     G. Age:  31w 4d         19  %  LV:        4.9  mm  Est. FW:    2712  gm           6 lb     93  % ---------------------------------------------------------------------- OB History  Gravidity:    3         Term:   1        Prem:   0        SAB:   1  TOP:          0       Ectopic:  0  Living: 1 ---------------------------------------------------------------------- Gestational Age  LMP:           30w 1d        Date:  05/21/19                 EDD:   02/25/20  U/S Today:     35w 4d                                        EDD:   01/18/20  Best:          33w 4d     Det. By:  U/S  (09/04/19)          EDD:   02/01/20 ---------------------------------------------------------------------- Anatomy  Cranium:               Appears normal         LVOT:                   Previously seen  Cavum:                 Previously seen        Aortic Arch:            Not well visualized  Ventricles:            Appears normal         Ductal Arch:            Not well visualized  Choroid Plexus:        Previously seen        Diaphragm:              Appears normal  Cerebellum:            Previously seen        Stomach:                Appears normal, left                                                                        sided  Posterior Fossa:       Previously seen        Abdomen:                Appears normal  Nuchal Fold:           Previously seen        Abdominal Wall:         Previously seen  Face:                  Orbits and profile     Cord Vessels:           Previously seen                         previously seen  Lips:                  Previously seen        Kidneys:                Appear normal  Palate:  Not well visualized    Bladder:                Appears normal  Thoracic:              Appears normal         Spine:                  Previously seen  Heart:                 Previously seen        Upper Extremities:      Previously seen  RVOT:                  Previously seen        Lower Extremities:       Previously seen  Other:  Female gender prev seen.  Nasal bone, Heels and 5th digit prev          visualized. Technically difficult due to maternal habitus and fetal          position. ---------------------------------------------------------------------- Cervix Uterus Adnexa  Cervix  Not visualized (advanced GA >24wks) ---------------------------------------------------------------------- Impression  Patient with placenta previa returned for fetal growth  assessment. She does not have vaginal bleeding now. She  was admitted in January following vaginal bleeding.  Her EDD of 02/01/2020 was established by first ultrasound  performed at our office on 09/04/19 and the GA was 18w 4d.  Earlier ultrasound dating performed on 08/22/19 should not  be considered for calculating EDD as only BPD  measurements were taken.  Her pregnancy is 33w 4d today.  On ultrasound, mild polyhydramnios was seen (AFI=27 cm).  Good fetal activity is seen. The estimated fetal weight is at  the 93rd percentile. Antenatal testing is reassuring. BPP 8/8.  Placenta previa is seen again.  I counseled the patient on the findings, EDD calculation and  timing of delivery.  We recommend delivery at 37 weeks or at 36 weeks because  of history of vaginal bleeding and previous hospital  admissions. Patient's wishes should be considered.  I also informed her that polyhydramnios may lead to uterine  contractions and vaginal bleeding.  She does not have gestational diabetes.  I discussed with Dr. Vergie Living who will be seeing her tomorrow. ---------------------------------------------------------------------- Recommendations  -Follow-up scans as clnically indicated.  -Delivery at 36 or 37 weeks (EDD 02/01/2020). ----------------------------------------------------------------------                  Noralee Space, MD Electronically Signed Final Report   12/18/2019 03:21 pm ----------------------------------------------------------------------  [place] MFM OB FOLLOW  UP  Result Date: 12/18/2019 ----------------------------------------------------------------------  OBSTETRICS REPORT                       (Signed Final 12/18/2019 03:21 pm) ---------------------------------------------------------------------- Patient Info  ID #:       [id number]                          D.O.B.:  07-18-1996 (23 yrs)  Name:       Madison Cook                Visit Date: 12/18/2019 02:09 pm ---------------------------------------------------------------------- Performed By  Performed By:     Earley Brooke     Ref. Address:     [address]  [place]  Attending:        Tama High MD        [facility]  Referred By:      [place] ---------------------------------------------------------------------- Orders   #  Description                          Code         Ordered By   1  [place] MFM OB FOLLOW UP                  [phone]     YU FANG   2  [place] MFM FETAL BPP WO NON              76819.01     YU FANG      STRESS  ----------------------------------------------------------------------   #  Order #                    Accession #                 Episode #   1  [id number]                  [id number]   2  [id number]                  [id number]                  [id number]  ---------------------------------------------------------------------- Indications   Placenta previa with hemorrhage, third         O44.13   trimester   [redacted] weeks gestation of pregnancy                W2H.85   Obesity complicating pregnancy, third          O99.213   trimester   Short interval between pregancies, 3rd         O09.893   trimester   Late to prenatal care, third trimester         O09.33   Poor obstetric history: Previous               O09.299   preeclampsia / eclampsia/gestational HTN   Rh negative state in  antepartum                O36.0190  ---------------------------------------------------------------------- Vital Signs  Height:        5'3" ---------------------------------------------------------------------- Fetal Evaluation  Num Of Fetuses:         1  Fetal Heart Rate(bpm):  167  Cardiac Activity:       Observed  Presentation:           Transverse, head to maternal left  Placenta:               Central previa  P. Cord Insertion:      Marginal insertion  Amniotic Fluid  AFI FV:      Polyhydramnios  AFI Sum(cm)     %Tile       Largest Pocket(cm)  27.35           97          11.6  RUQ(cm)       RLQ(cm)       LUQ(cm)        LLQ(cm)  4.34          5.8           11.6           5.61 ---------------------------------------------------------------------- Biophysical Evaluation  Amniotic F.V:   Pocket => 2 cm             F. Tone:        Observed  F. Movement:    Observed                   Score:          8/8  F. Breathing:   Observed ---------------------------------------------------------------------- Biometry  BPD:      88.6  mm     G. Age:  35w 6d         95  %    CI:        77.71   %    70 - 86                                                          FL/HC:      20.7   %    19.4 - 21.8  HC:      318.1  mm     G. Age:  35w 6d         71  %    HC/AC:      0.98        0.96 - 1.11  AC:      323.2  mm     G. Age:  36w 2d         98  %    FL/BPD:     74.5   %    71 - 87  FL:         66  mm     G. Age:  34w 0d         51  %    FL/AC:      20.4   %    20 - 24  HUM:      54.4  mm     G. Age:  31w 4d         19  %  LV:        4.9  mm  Est. FW:    2712  gm  6 lb     93  % ---------------------------------------------------------------------- OB History  Gravidity:    3         Term:   1        Prem:   0        SAB:   1  TOP:          0       Ectopic:  0        Living: 1 ---------------------------------------------------------------------- Gestational Age  LMP:            30w 1d        Date:  05/21/19                 EDD:   02/25/20  U/S Today:     35w 4d                                        EDD:   01/18/20  Best:          33w 4d     Det. By:  U/S  (09/04/19)          EDD:   02/01/20 ---------------------------------------------------------------------- Anatomy  Cranium:               Appears normal         LVOT:                   Previously seen  Cavum:                 Previously seen        Aortic Arch:            Not well visualized  Ventricles:            Appears normal         Ductal Arch:            Not well visualized  Choroid Plexus:        Previously seen        Diaphragm:              Appears normal  Cerebellum:            Previously seen        Stomach:                Appears normal, left                                                                        sided  Posterior Fossa:       Previously seen        Abdomen:                Appears normal  Nuchal Fold:           Previously seen        Abdominal Wall:         Previously seen  Face:                  Orbits and profile     Cord Vessels:  Previously seen                         previously seen  Lips:                  Previously seen        Kidneys:                Appear normal  Palate:                Not well visualized    Bladder:                Appears normal  Thoracic:              Appears normal         Spine:                  Previously seen  Heart:                 Previously seen        Upper Extremities:      Previously seen  RVOT:                  Previously seen        Lower Extremities:      Previously seen  Other:  Female gender prev seen.  Nasal bone, Heels and 5th digit prev          visualized. Technically difficult due to maternal habitus and fetal          position. ---------------------------------------------------------------------- Cervix Uterus Adnexa  Cervix  Not visualized (advanced GA >24wks) ---------------------------------------------------------------------- Impression  Patient  with placenta previa returned for fetal growth  assessment. She does not have vaginal bleeding now. She  was admitted in January following vaginal bleeding.  Her EDD of 02/01/2020 was established by first ultrasound  performed at our office on 09/04/19 and the GA was 18w 4d.  Earlier ultrasound dating performed on 08/22/19 should not  be considered for calculating EDD as only BPD  measurements were taken.  Her pregnancy is 33w 4d today.  On ultrasound, mild polyhydramnios was seen (AFI=27 cm).  Good fetal activity is seen. The estimated fetal weight is at  the 93rd percentile. Antenatal testing is reassuring. BPP 8/8.  Placenta previa is seen again.  I counseled the patient on the findings, EDD calculation and  timing of delivery.  We recommend delivery at 37 weeks or at 36 weeks because  of history of vaginal bleeding and previous hospital  admissions. Patient's wishes should be considered.  I also informed her that polyhydramnios may lead to uterine  contractions and vaginal bleeding.  She does not have gestational diabetes.  I discussed with Dr. Vergie Living who will be seeing her tomorrow. ---------------------------------------------------------------------- Recommendations  -Follow-up scans as clnically indicated.  -Delivery at 36 or 37 weeks (EDD 02/01/2020). ----------------------------------------------------------------------                  Noralee Space, MD Electronically Signed Final Report   12/18/2019 03:21 pm ----------------------------------------------------------------------   Assessment and Plan:  Pregnancy: G3P1011 at [redacted]w[redacted]d 1. COVID-19 affecting pregnancy in second trimester   2. Transient hypertension of pregnancy in third trimester BP is ok today  3. Placenta previa in third trimester No bleeding for C-section on 4/5  4. Supervision of high risk pregnancy, antepartum   Preterm labor symptoms and general obstetric precautions including but not limited to vaginal  bleeding,  contractions, leaking of fluid and fetal movement were reviewed in detail with the patient. I discussed the assessment and treatment plan with the patient. The patient was provided an opportunity to ask questions and all were answered. The patient agreed with the plan and demonstrated an understanding of the instructions. The patient was advised to call back or seek an in-person office evaluation/[facility] for any urgent or concerning symptoms. Please refer to After Visit Summary for other counseling recommendations.   I provided 9 minutes of face-to-face time during this encounter.  Return in 1 week (on 01/02/2020).  Future Appointments  Date Time Provider Department Center  12/31/2019 11:15 AM [place] Bing[place]ens, Charlie, MD [facility]  01/02/2020  8:40 AM [facility] 1 [facility] None    Reva Boresanya S Aryona Sill, MD [facility]

## 2019-12-30 ENCOUNTER — Encounter (HOSPITAL_COMMUNITY): Payer: Self-pay

## 2019-12-31 ENCOUNTER — Ambulatory Visit (INDEPENDENT_AMBULATORY_CARE_PROVIDER_SITE_OTHER): Payer: Commercial Managed Care - PPO | Admitting: Obstetrics and Gynecology

## 2019-12-31 ENCOUNTER — Other Ambulatory Visit: Payer: Self-pay

## 2019-12-31 VITALS — BP 121/86 | HR 106 | Wt 254.0 lb

## 2019-12-31 DIAGNOSIS — O99820 Streptococcus B carrier state complicating pregnancy: Secondary | ICD-10-CM

## 2019-12-31 DIAGNOSIS — Z3A35 35 weeks gestation of pregnancy: Secondary | ICD-10-CM

## 2019-12-31 DIAGNOSIS — O4403 Placenta previa specified as without hemorrhage, third trimester: Secondary | ICD-10-CM

## 2019-12-31 DIAGNOSIS — O403XX0 Polyhydramnios, third trimester, not applicable or unspecified: Secondary | ICD-10-CM

## 2019-12-31 DIAGNOSIS — O0990 Supervision of high risk pregnancy, unspecified, unspecified trimester: Secondary | ICD-10-CM

## 2019-12-31 DIAGNOSIS — R768 Other specified abnormal immunological findings in serum: Secondary | ICD-10-CM

## 2019-12-31 DIAGNOSIS — O3663X0 Maternal care for excessive fetal growth, third trimester, not applicable or unspecified: Secondary | ICD-10-CM

## 2019-12-31 NOTE — Progress Notes (Signed)
Prenatal Visit Note Date: 12/31/2019 Clinic: [facility]  Subjective:  Madison Cook is a 24 y.o. G3P1011 at [redacted]w[redacted]d being seen today for ongoing prenatal care.  She is currently monitored for the following issues for this high-risk pregnancy and has BMI 40.0-44.9, adult (HCC); Obesity in pregnancy; Supervision of high risk pregnancy, antepartum; Rh negative state in antepartum period; GBS (group B Streptococcus carrier), +RV culture, currently pregnant; History of gestational hypertension; Short interval between pregnancies affecting pregnancy, antepartum; Late prenatal care; Placenta previa; Marginal insertion of umbilical cord affecting management of mother; COVID-19 affecting pregnancy in second trimester; Transient hypertension of pregnancy; Anemia in pregnancy; Red blood cell antibody positive; Excessive fetal growth affecting management of mother in third trimester, antepartum; and Polyhydramnios in third trimester on their problem list.  Patient reports no complaints.   Contractions: Irregular. Vag. Bleeding: None.  Movement: Present. Denies leaking of fluid.   The following portions of the patient's history were reviewed and updated as appropriate: allergies, current medications, past family history, past medical history, past social history, past surgical history and problem list. Problem list updated.  Objective:   Vitals:   12/31/19 1134  BP: 121/86  Pulse: (!) 106  Weight: 254 lb (115.2 kg)    Fetal Status: Fetal Heart Rate (bpm): 147   Movement: Present     General:  Alert, oriented and cooperative. Patient is in no acute distress.  Skin: Skin is warm and dry. No rash noted.   Cardiovascular: Normal heart rate noted  Respiratory: Normal respiratory effort, no problems with respiration noted  Abdomen: Soft, gravid, appropriate for gestational age. Pain/Pressure: Absent     Pelvic:  Cervical exam deferred        Extremities: Normal range of motion.   Edema: None  Mental Status: Normal mood and affect. Normal behavior. Normal judgment and thought content.   Urinalysis:      Assessment and Plan:  Pregnancy: G3P1011 at [redacted]w[redacted]d  1. GBS (group B Streptococcus carrier), +RV culture, currently pregnant  2. Supervision of high risk pregnancy, antepartum Routine care  3. Placenta previa in third trimester Scheduled for 4/5 primary c-section  4. Red blood cell antibody positive Follow up type and screen and blood bank to see if she still has antibodies that were seen during her last hospitalization  5. Polyhydramnios in third trimester complication, single or unspecified fetus No issues  6. Excessive fetal growth affecting management of pregnancy in third trimester, single or unspecified fetus No issue  Preterm labor symptoms and general obstetric precautions including but not limited to vaginal bleeding, contractions, leaking of fluid and fetal movement were reviewed in detail with the patient. Please refer to After Visit Summary for other counseling recommendations.  RTC 2-3wk for incision check   [place] Bing, MD

## 2020-01-02 ENCOUNTER — Other Ambulatory Visit (HOSPITAL_COMMUNITY)
Admission: RE | Admit: 2020-01-02 | Discharge: 2020-01-02 | Disposition: A | Discharge status: Home / Self Care | Payer: Commercial Managed Care - PPO | Source: Ambulatory Visit | Attending: Obstetrics and Gynecology | Admitting: Obstetrics and Gynecology

## 2020-01-02 ENCOUNTER — Other Ambulatory Visit: Payer: Self-pay | Admitting: Family Medicine

## 2020-01-03 NOTE — Patient Instructions (Signed)
Madison Cook  01/03/2020   Your procedure is scheduled on:  01/06/2020  Arrive at 0800 at Entrance C on [address] at [facility]  and [employer]. You are invited to use the FREE valet parking or use the Visitor's parking deck.  Pick up the phone at the desk and dial [phone].  Call this number if you have problems the morning of surgery: [phone]  Remember:   Do not eat food:(After Midnight) Desps de medianoche.  Do not drink clear liquids: (After Midnight) Desps de medianoche.  Take these medicines the morning of surgery with A SIP OF WATER:  none   Do not wear jewelry, make-up or nail polish.  Do not wear lotions, powders, or perfumes. Do not wear deodorant.  Do not shave 48 hours prior to surgery.  Do not bring valuables to the hospital.  [facility] is not   responsible for any belongings or valuables brought to the hospital.  Contacts, dentures or bridgework may not be worn into surgery.  Leave suitcase in the car. After surgery it may be brought to your room.  For patients admitted to the hospital, checkout time is 11:00 AM the day of              discharge.      Please read over the following fact sheets that you were given:     Preparing for Surgery

## 2020-01-04 ENCOUNTER — Other Ambulatory Visit (HOSPITAL_COMMUNITY)
Admission: RE | Admit: 2020-01-04 | Discharge: 2020-01-04 | Disposition: A | Discharge status: Home / Self Care | Payer: Commercial Managed Care - PPO | Source: Ambulatory Visit | Attending: Obstetrics and Gynecology | Admitting: Obstetrics and Gynecology

## 2020-01-04 ENCOUNTER — Encounter (HOSPITAL_COMMUNITY): Payer: Self-pay | Admitting: Family Medicine

## 2020-01-04 ENCOUNTER — Other Ambulatory Visit: Payer: Self-pay

## 2020-01-04 LAB — RAPID HIV SCREEN (HIV 1/2 AB+AG)
HIV 1/2 Antibodies: NONREACTIVE
HIV-1 P24 Antigen - HIV24: NONREACTIVE

## 2020-01-04 LAB — TYPE AND SCREEN
ABO/RH(D): A NEG
Antibody Screen: NEGATIVE

## 2020-01-04 LAB — CBC
HCT: 36.4 % (ref 36.0–46.0)
Hemoglobin: 11.9 g/dL — ABNORMAL LOW (ref 12.0–15.0)
MCH: 30.1 pg (ref 26.0–34.0)
MCHC: 32.7 g/dL (ref 30.0–36.0)
MCV: 92.2 fL (ref 80.0–100.0)
Platelets: 240 10*3/uL (ref 150–400)
RBC: 3.95 MIL/uL (ref 3.87–5.11)
RDW: 14.4 % (ref 11.5–15.5)
WBC: 13.9 10*3/uL — ABNORMAL HIGH (ref 4.0–10.5)
nRBC: 0 % (ref 0.0–0.2)

## 2020-01-04 LAB — SARS CORONAVIRUS 2 (TAT 6-24 HRS): SARS Coronavirus 2: NEGATIVE

## 2020-01-04 NOTE — Anesthesia Preprocedure Evaluation (Addendum)
Anesthesia Evaluation  Patient identified by MRN, date of birth, ID band Patient awake    Reviewed: Allergy & Precautions, NPO status , Patient's Chart, lab work & pertinent test results  Airway Mallampati: III  TM Distance: >3 FB Neck ROM: Full    Dental no notable dental hx. (+) Teeth Intact, Dental Advisory Given   Pulmonary neg pulmonary ROS, asthma ,  Covid + 09/24/19   Pulmonary exam normal breath sounds clear to auscultation       Cardiovascular hypertension, Normal cardiovascular exam Rhythm:Regular Rate:Normal  Hx/o Gestational HTN with last pregnancy   Neuro/Psych negative neurological ROS  negative psych ROS   GI/Hepatic Neg liver ROS, GERD  Medicated,  Endo/Other  Morbid obesity (BMI 45)  Renal/GU negative Renal ROS  negative genitourinary   Musculoskeletal negative musculoskeletal ROS (+)   Abdominal   Peds  Hematology  (+) anemia ,   Anesthesia Other Findings Primary C/S 2/2 known previa with acute onset vaginal bleeding and contractions   Reproductive/Obstetrics (+) Pregnancy Marginal insertion of UC Placenta Previa 36 3/[redacted] weeks gestation                            Anesthesia Physical Anesthesia Plan  ASA: III and emergent  Anesthesia Plan: Spinal   Post-op Pain Management:    Induction:   PONV Risk Score and Plan: 4 or greater and Ondansetron and Treatment may vary due to age or medical condition  Airway Management Planned: Natural Airway  Additional Equipment:   Intra-op Plan:   Post-operative Plan:   Informed Consent:   Plan Discussed with:   Anesthesia Plan Comments: (2 IVs and 2U RBCs on hold)       Anesthesia Quick Evaluation

## 2020-01-04 NOTE — MAU Note (Signed)
Pt here for covid swab and lab draw. Denies symptoms and sick contacts. Swab collected. 

## 2020-01-05 ENCOUNTER — Inpatient Hospital Stay (HOSPITAL_COMMUNITY)
Admission: AD | Admit: 2020-01-05 | Discharge: 2020-01-09 | DRG: 787 | Disposition: A | Discharge status: Home / Self Care | Payer: Commercial Managed Care - PPO | Attending: Obstetrics & Gynecology | Admitting: Obstetrics & Gynecology

## 2020-01-05 DIAGNOSIS — O99019 Anemia complicating pregnancy, unspecified trimester: Secondary | ICD-10-CM | POA: Diagnosis present

## 2020-01-05 DIAGNOSIS — O09899 Supervision of other high risk pregnancies, unspecified trimester: Secondary | ICD-10-CM

## 2020-01-05 DIAGNOSIS — O165 Unspecified maternal hypertension, complicating the puerperium: Secondary | ICD-10-CM | POA: Diagnosis not present

## 2020-01-05 DIAGNOSIS — O26893 Other specified pregnancy related conditions, third trimester: Secondary | ICD-10-CM | POA: Diagnosis present

## 2020-01-05 DIAGNOSIS — Z6791 Unspecified blood type, Rh negative: Secondary | ICD-10-CM

## 2020-01-05 DIAGNOSIS — O99820 Streptococcus B carrier state complicating pregnancy: Secondary | ICD-10-CM

## 2020-01-05 DIAGNOSIS — O4400 Placenta previa specified as without hemorrhage, unspecified trimester: Secondary | ICD-10-CM | POA: Diagnosis present

## 2020-01-05 DIAGNOSIS — O99214 Obesity complicating childbirth: Secondary | ICD-10-CM | POA: Diagnosis present

## 2020-01-05 DIAGNOSIS — O99210 Obesity complicating pregnancy, unspecified trimester: Secondary | ICD-10-CM | POA: Diagnosis present

## 2020-01-05 DIAGNOSIS — O43199 Other malformation of placenta, unspecified trimester: Secondary | ICD-10-CM | POA: Diagnosis present

## 2020-01-05 DIAGNOSIS — Z6841 Body Mass Index (BMI) 40.0 and over, adult: Secondary | ICD-10-CM

## 2020-01-05 DIAGNOSIS — O0930 Supervision of pregnancy with insufficient antenatal care, unspecified trimester: Secondary | ICD-10-CM

## 2020-01-05 DIAGNOSIS — O4413 Placenta previa with hemorrhage, third trimester: Principal | ICD-10-CM | POA: Diagnosis present

## 2020-01-05 DIAGNOSIS — Z3A36 36 weeks gestation of pregnancy: Secondary | ICD-10-CM

## 2020-01-05 DIAGNOSIS — O26899 Other specified pregnancy related conditions, unspecified trimester: Secondary | ICD-10-CM

## 2020-01-05 DIAGNOSIS — O43123 Velamentous insertion of umbilical cord, third trimester: Secondary | ICD-10-CM | POA: Diagnosis present

## 2020-01-05 DIAGNOSIS — Z20822 Contact with and (suspected) exposure to covid-19: Secondary | ICD-10-CM | POA: Diagnosis present

## 2020-01-05 DIAGNOSIS — D62 Acute posthemorrhagic anemia: Secondary | ICD-10-CM | POA: Diagnosis not present

## 2020-01-05 DIAGNOSIS — Z8759 Personal history of other complications of pregnancy, childbirth and the puerperium: Secondary | ICD-10-CM

## 2020-01-05 DIAGNOSIS — O9081 Anemia of the puerperium: Secondary | ICD-10-CM | POA: Diagnosis not present

## 2020-01-05 DIAGNOSIS — O99824 Streptococcus B carrier state complicating childbirth: Secondary | ICD-10-CM | POA: Diagnosis present

## 2020-01-05 LAB — RPR: RPR Ser Ql: NONREACTIVE

## 2020-01-06 ENCOUNTER — Encounter (HOSPITAL_COMMUNITY): Payer: Self-pay | Admitting: Obstetrics & Gynecology

## 2020-01-06 ENCOUNTER — Inpatient Hospital Stay (HOSPITAL_COMMUNITY)
Admission: RE | Admit: 2020-01-06 | Payer: Commercial Managed Care - PPO | Source: Home / Self Care | Admitting: Family Medicine

## 2020-01-06 ENCOUNTER — Inpatient Hospital Stay (HOSPITAL_COMMUNITY): Payer: Commercial Managed Care - PPO | Admitting: Anesthesiology

## 2020-01-06 ENCOUNTER — Encounter (HOSPITAL_COMMUNITY)
Admission: AD | Disposition: A | Discharge status: Home / Self Care | Payer: Self-pay | Source: Home / Self Care | Attending: Obstetrics & Gynecology

## 2020-01-06 DIAGNOSIS — O4413 Placenta previa with hemorrhage, third trimester: Secondary | ICD-10-CM | POA: Diagnosis present

## 2020-01-06 DIAGNOSIS — O9081 Anemia of the puerperium: Secondary | ICD-10-CM | POA: Diagnosis not present

## 2020-01-06 DIAGNOSIS — O26893 Other specified pregnancy related conditions, third trimester: Secondary | ICD-10-CM | POA: Diagnosis present

## 2020-01-06 DIAGNOSIS — Z3A36 36 weeks gestation of pregnancy: Secondary | ICD-10-CM | POA: Diagnosis not present

## 2020-01-06 DIAGNOSIS — D62 Acute posthemorrhagic anemia: Secondary | ICD-10-CM | POA: Diagnosis not present

## 2020-01-06 DIAGNOSIS — O99824 Streptococcus B carrier state complicating childbirth: Secondary | ICD-10-CM

## 2020-01-06 DIAGNOSIS — Z6791 Unspecified blood type, Rh negative: Secondary | ICD-10-CM | POA: Diagnosis not present

## 2020-01-06 DIAGNOSIS — O99214 Obesity complicating childbirth: Secondary | ICD-10-CM | POA: Diagnosis present

## 2020-01-06 DIAGNOSIS — Z20822 Contact with and (suspected) exposure to covid-19: Secondary | ICD-10-CM | POA: Diagnosis present

## 2020-01-06 DIAGNOSIS — O43123 Velamentous insertion of umbilical cord, third trimester: Secondary | ICD-10-CM | POA: Diagnosis present

## 2020-01-06 LAB — COMPREHENSIVE METABOLIC PANEL
ALT: 8 U/L (ref 0–44)
AST: 15 U/L (ref 15–41)
Albumin: 2.1 g/dL — ABNORMAL LOW (ref 3.5–5.0)
Alkaline Phosphatase: 74 U/L (ref 38–126)
Anion gap: 11 (ref 5–15)
BUN: 7 mg/dL (ref 6–20)
CO2: 22 mmol/L (ref 22–32)
Calcium: 8.4 mg/dL — ABNORMAL LOW (ref 8.9–10.3)
Chloride: 107 mmol/L (ref 98–111)
Creatinine, Ser: 0.73 mg/dL (ref 0.44–1.00)
GFR calc Af Amer: >60 mL/min (ref >60)
GFR calc non Af Amer: >60 mL/min (ref >60)
Glucose, Bld: 108 mg/dL — ABNORMAL HIGH (ref 70–99)
Potassium: 3.8 mmol/L (ref 3.5–5.1)
Sodium: 140 mmol/L (ref 135–145)
Total Bilirubin: 0.4 mg/dL (ref 0.3–1.2)
Total Protein: 5.3 g/dL — ABNORMAL LOW (ref 6.5–8.1)

## 2020-01-06 LAB — CBC
HCT: 32.5 % — ABNORMAL LOW (ref 36.0–46.0)
HCT: 28.9 % — ABNORMAL LOW (ref 36.0–46.0)
Hemoglobin: 10.6 g/dL — ABNORMAL LOW (ref 12.0–15.0)
Hemoglobin: 9.3 g/dL — ABNORMAL LOW (ref 12.0–15.0)
MCH: 30.3 pg (ref 26.0–34.0)
MCH: 29.4 pg (ref 26.0–34.0)
MCHC: 32.6 g/dL (ref 30.0–36.0)
MCHC: 32.2 g/dL (ref 30.0–36.0)
MCV: 92.9 fL (ref 80.0–100.0)
MCV: 91.5 fL (ref 80.0–100.0)
Platelets: 211 10*3/uL (ref 150–400)
Platelets: 201 10*3/uL (ref 150–400)
RBC: 3.5 MIL/uL — ABNORMAL LOW (ref 3.87–5.11)
RBC: 3.16 MIL/uL — ABNORMAL LOW (ref 3.87–5.11)
RDW: 14.6 % (ref 11.5–15.5)
RDW: 14.5 % (ref 11.5–15.5)
WBC: 25.4 10*3/uL — ABNORMAL HIGH (ref 4.0–10.5)
WBC: 18.8 10*3/uL — ABNORMAL HIGH (ref 4.0–10.5)
nRBC: 0 % (ref 0.0–0.2)
nRBC: 0 % (ref 0.0–0.2)

## 2020-01-06 LAB — TYPE AND SCREEN
ABO/RH(D): A NEG
Antibody Screen: NEGATIVE

## 2020-01-06 LAB — RPR: RPR Ser Ql: NONREACTIVE

## 2020-01-06 SURGERY — Surgical Case
Anesthesia: Spinal | Wound class: Clean Contaminated

## 2020-01-06 MED ORDER — PHENYLEPHRINE HCL (PRESSORS) 10 MG/ML IV SOLN
INTRAVENOUS | Status: DC | PRN
  Administered 2020-01-06 (×2): 80 ug via INTRAVENOUS

## 2020-01-06 MED ORDER — SIMETHICONE 80 MG PO CHEW
80.0000 mg | CHEWABLE_TABLET | ORAL | Status: DC
  Administered 2020-01-06 – 2020-01-09 (×3): 80 mg via ORAL
  Filled 2020-01-06 (×3): qty 1

## 2020-01-06 MED ORDER — MORPHINE SULFATE (PF) 0.5 MG/ML IJ SOLN
INTRAMUSCULAR | Status: DC | PRN
  Administered 2020-01-06: .15 ug via EPIDURAL

## 2020-01-06 MED ORDER — METHYLERGONOVINE MALEATE 0.2 MG/ML IJ SOLN
INTRAMUSCULAR | Status: DC | PRN
  Administered 2020-01-06: .2 mg via INTRAMUSCULAR

## 2020-01-06 MED ORDER — MENTHOL 3 MG MT LOZG: 1.0000 | LOZENGE | OROMUCOSAL | Status: DC | PRN

## 2020-01-06 MED ORDER — MISOPROSTOL 200 MCG PO TABS
800.0000 ug | ORAL_TABLET | Freq: Once | ORAL | Status: AC
  Administered 2020-01-06: 03:00:00 800 ug via BUCCAL

## 2020-01-06 MED ORDER — ACETAMINOPHEN 325 MG PO TABS
650.0000 mg | ORAL_TABLET | Freq: Four times a day (QID) | ORAL | Status: DC | PRN
  Administered 2020-01-07 – 2020-01-09 (×4): 650 mg via ORAL
  Filled 2020-01-06 (×4): qty 2

## 2020-01-06 MED ORDER — KETOROLAC TROMETHAMINE 30 MG/ML IJ SOLN
30.0000 mg | Freq: Four times a day (QID) | INTRAMUSCULAR | Status: AC
  Administered 2020-01-06 (×4): 30 mg via INTRAVENOUS
  Filled 2020-01-06 (×4): qty 1

## 2020-01-06 MED ORDER — ONDANSETRON HCL 4 MG/2ML IJ SOLN
INTRAMUSCULAR | Status: DC | PRN
  Administered 2020-01-06: 4 mg via INTRAVENOUS

## 2020-01-06 MED ORDER — TETANUS-DIPHTH-ACELL PERTUSSIS 5-2.5-18.5 LF-MCG/0.5 IM SUSP: 0.5000 mL | Freq: Once | INTRAMUSCULAR | Status: DC

## 2020-01-06 MED ORDER — LACTATED RINGERS IV SOLN: INTRAVENOUS | Status: DC

## 2020-01-06 MED ORDER — ENOXAPARIN SODIUM 60 MG/0.6ML ~~LOC~~ SOLN
0.5000 mg/kg | SUBCUTANEOUS | Status: DC
  Administered 2020-01-06 – 2020-01-08 (×3): 60 mg via SUBCUTANEOUS
  Filled 2020-01-06 (×3): qty 0.6

## 2020-01-06 MED ORDER — OXYTOCIN 40 UNITS IN NORMAL SALINE INFUSION - SIMPLE MED
INTRAVENOUS | Status: DC | PRN
  Administered 2020-01-06: 40 [IU] via INTRAVENOUS

## 2020-01-06 MED ORDER — NALOXONE HCL 4 MG/10ML IJ SOLN
1.0000 ug/kg/h | INTRAVENOUS | Status: DC | PRN
  Filled 2020-01-06: qty 5

## 2020-01-06 MED ORDER — DIPHENHYDRAMINE HCL 25 MG PO CAPS: 25.0000 mg | ORAL_CAPSULE | Freq: Four times a day (QID) | ORAL | Status: DC | PRN

## 2020-01-06 MED ORDER — DIPHENHYDRAMINE HCL 50 MG/ML IJ SOLN: 12.5000 mg | INTRAMUSCULAR | Status: DC | PRN

## 2020-01-06 MED ORDER — NALBUPHINE HCL 10 MG/ML IJ SOLN: 5.0000 mg | INTRAMUSCULAR | Status: DC | PRN

## 2020-01-06 MED ORDER — SIMETHICONE 80 MG PO CHEW
80.0000 mg | CHEWABLE_TABLET | ORAL | Status: DC | PRN
  Filled 2020-01-06: qty 1

## 2020-01-06 MED ORDER — DIBUCAINE (PERIANAL) 1 % EX OINT: 1.0000 "application " | TOPICAL_OINTMENT | CUTANEOUS | Status: DC | PRN

## 2020-01-06 MED ORDER — PHENYLEPHRINE HCL-NACL 20-0.9 MG/250ML-% IV SOLN
INTRAVENOUS | Status: DC | PRN
  Administered 2020-01-06: 60 ug/min via INTRAVENOUS

## 2020-01-06 MED ORDER — WITCH HAZEL-GLYCERIN EX PADS: 1.0000 "application " | MEDICATED_PAD | CUTANEOUS | Status: DC | PRN

## 2020-01-06 MED ORDER — BUPIVACAINE IN DEXTROSE 0.75-8.25 % IT SOLN
INTRATHECAL | Status: DC | PRN
  Administered 2020-01-06: 1.6 mL via INTRATHECAL

## 2020-01-06 MED ORDER — OXYTOCIN 40 UNITS IN NORMAL SALINE INFUSION - SIMPLE MED: 2.5000 [IU]/h | INTRAVENOUS | Status: AC

## 2020-01-06 MED ORDER — KETOROLAC TROMETHAMINE 30 MG/ML IJ SOLN: 30.0000 mg | Freq: Four times a day (QID) | INTRAMUSCULAR | Status: DC | PRN

## 2020-01-06 MED ORDER — ACETAMINOPHEN 10 MG/ML IV SOLN
INTRAVENOUS | Status: AC
  Filled 2020-01-06: qty 100

## 2020-01-06 MED ORDER — FENTANYL CITRATE (PF) 100 MCG/2ML IJ SOLN
INTRAMUSCULAR | Status: DC | PRN
  Administered 2020-01-06: 15 ug via INTRAVENOUS

## 2020-01-06 MED ORDER — SODIUM CHLORIDE 0.9% FLUSH: 3.0000 mL | INTRAVENOUS | Status: DC | PRN

## 2020-01-06 MED ORDER — LACTATED RINGERS IV SOLN: INTRAVENOUS | Status: DC | PRN

## 2020-01-06 MED ORDER — SIMETHICONE 80 MG PO CHEW
80.0000 mg | CHEWABLE_TABLET | Freq: Three times a day (TID) | ORAL | Status: DC
  Administered 2020-01-06 – 2020-01-09 (×10): 80 mg via ORAL
  Filled 2020-01-06 (×10): qty 1

## 2020-01-06 MED ORDER — FENTANYL CITRATE (PF) 100 MCG/2ML IJ SOLN
INTRAMUSCULAR | Status: AC
  Filled 2020-01-06: qty 2

## 2020-01-06 MED ORDER — IBUPROFEN 800 MG PO TABS
800.0000 mg | ORAL_TABLET | Freq: Four times a day (QID) | ORAL | Status: DC
  Administered 2020-01-07 – 2020-01-09 (×10): 800 mg via ORAL
  Filled 2020-01-06 (×10): qty 1

## 2020-01-06 MED ORDER — SODIUM CHLORIDE 0.9 % IR SOLN
Status: DC | PRN
  Administered 2020-01-06: 1000 mL

## 2020-01-06 MED ORDER — NALBUPHINE HCL 10 MG/ML IJ SOLN: 5.0000 mg | Freq: Once | INTRAMUSCULAR | Status: DC | PRN

## 2020-01-06 MED ORDER — ACETAMINOPHEN 500 MG PO TABS
1000.0000 mg | ORAL_TABLET | Freq: Four times a day (QID) | ORAL | Status: AC
  Administered 2020-01-06 – 2020-01-07 (×4): 1000 mg via ORAL
  Filled 2020-01-06 (×4): qty 2

## 2020-01-06 MED ORDER — FENTANYL CITRATE (PF) 100 MCG/2ML IJ SOLN: INTRAMUSCULAR | Status: DC | PRN

## 2020-01-06 MED ORDER — ACETAMINOPHEN 10 MG/ML IV SOLN
1000.0000 mg | Freq: Once | INTRAVENOUS | Status: DC | PRN
  Administered 2020-01-06: 1000 mg via INTRAVENOUS

## 2020-01-06 MED ORDER — STERILE WATER FOR IRRIGATION IR SOLN
Status: DC | PRN
  Administered 2020-01-06: 1000 mL

## 2020-01-06 MED ORDER — PRENATAL MULTIVITAMIN CH
1.0000 | ORAL_TABLET | Freq: Every day | ORAL | Status: DC
  Administered 2020-01-06 – 2020-01-09 (×4): 1 via ORAL
  Filled 2020-01-06 (×4): qty 1

## 2020-01-06 MED ORDER — ONDANSETRON HCL 4 MG/2ML IJ SOLN
4.0000 mg | Freq: Three times a day (TID) | INTRAMUSCULAR | Status: DC | PRN
  Administered 2020-01-07: 4 mg via INTRAVENOUS
  Filled 2020-01-06: qty 2

## 2020-01-06 MED ORDER — DIPHENHYDRAMINE HCL 25 MG PO CAPS
25.0000 mg | ORAL_CAPSULE | ORAL | Status: DC | PRN
  Administered 2020-01-06: 13:00:00 25 mg via ORAL
  Filled 2020-01-06 (×2): qty 1

## 2020-01-06 MED ORDER — FENTANYL CITRATE (PF) 100 MCG/2ML IJ SOLN
25.0000 ug | INTRAMUSCULAR | Status: DC | PRN
  Administered 2020-01-06 (×2): 50 ug via INTRAVENOUS

## 2020-01-06 MED ORDER — SENNOSIDES-DOCUSATE SODIUM 8.6-50 MG PO TABS
2.0000 | ORAL_TABLET | ORAL | Status: DC
  Administered 2020-01-06 – 2020-01-09 (×3): 2 via ORAL
  Filled 2020-01-06 (×3): qty 2

## 2020-01-06 MED ORDER — PHENYLEPHRINE HCL (PRESSORS) 10 MG/ML IV SOLN: INTRAVENOUS | Status: DC | PRN

## 2020-01-06 MED ORDER — ZOLPIDEM TARTRATE 5 MG PO TABS: 5.0000 mg | ORAL_TABLET | Freq: Every evening | ORAL | Status: DC | PRN

## 2020-01-06 MED ORDER — SCOPOLAMINE 1 MG/3DAYS TD PT72: 1.0000 | MEDICATED_PATCH | Freq: Once | TRANSDERMAL | Status: DC

## 2020-01-06 MED ORDER — OXYCODONE HCL 5 MG PO TABS
5.0000 mg | ORAL_TABLET | ORAL | Status: DC | PRN
  Administered 2020-01-06: 22:00:00 10 mg via ORAL
  Administered 2020-01-06: 5 mg via ORAL
  Administered 2020-01-07 (×4): 10 mg via ORAL
  Administered 2020-01-08: 5 mg via ORAL
  Administered 2020-01-08 – 2020-01-09 (×5): 10 mg via ORAL
  Filled 2020-01-06: qty 1
  Filled 2020-01-06 (×5): qty 2
  Filled 2020-01-06: qty 1
  Filled 2020-01-06: qty 2
  Filled 2020-01-06: qty 1
  Filled 2020-01-06: qty 2
  Filled 2020-01-06: qty 1
  Filled 2020-01-06 (×3): qty 2

## 2020-01-06 MED ORDER — NALOXONE HCL 0.4 MG/ML IJ SOLN: 0.4000 mg | INTRAMUSCULAR | Status: DC | PRN

## 2020-01-06 MED ORDER — COCONUT OIL OIL: 1.0000 "application " | TOPICAL_OIL | Status: DC | PRN

## 2020-01-06 SURGICAL SUPPLY — 40 items
BENZOIN TINCTURE PRP APPL 2/3 (GAUZE/BANDAGES/DRESSINGS) ×2 IMPLANT
CHLORAPREP W/TINT 26ML (MISCELLANEOUS) ×2 IMPLANT
CLAMP CORD UMBIL (MISCELLANEOUS) IMPLANT
CLOTH BEACON ORANGE TIMEOUT ST (SAFETY) ×2 IMPLANT
DERMABOND ADHESIVE PROPEN (GAUZE/BANDAGES/DRESSINGS) ×1
DERMABOND ADVANCED .7 DNX6 (GAUZE/BANDAGES/DRESSINGS) ×1 IMPLANT
DRSG OPSITE POSTOP 4X10 (GAUZE/BANDAGES/DRESSINGS) ×2 IMPLANT
ELECT REM PT RETURN 9FT ADLT (ELECTROSURGICAL) ×2
ELECTRODE REM PT RTRN 9FT ADLT (ELECTROSURGICAL) ×1 IMPLANT
EXTRACTOR VACUUM M CUP 4 TUBE (SUCTIONS) IMPLANT
GLOVE BIOGEL PI IND STRL 7.0 (GLOVE) ×2 IMPLANT
GLOVE BIOGEL PI IND STRL 7.5 (GLOVE) ×2 IMPLANT
GLOVE BIOGEL PI INDICATOR 7.0 (GLOVE) ×2
GLOVE BIOGEL PI INDICATOR 7.5 (GLOVE) ×2
GLOVE ECLIPSE 7.5 STRL STRAW (GLOVE) ×2 IMPLANT
GOWN STRL REUS W/TWL LRG LVL3 (GOWN DISPOSABLE) ×6 IMPLANT
HOVERMATT SINGLE USE (MISCELLANEOUS) ×2 IMPLANT
KIT ABG SYR 3ML LUER SLIP (SYRINGE) IMPLANT
NEEDLE HYPO 25X5/8 SAFETYGLIDE (NEEDLE) IMPLANT
NS IRRIG 1000ML POUR BTL (IV SOLUTION) ×2 IMPLANT
PACK C SECTION WH (CUSTOM PROCEDURE TRAY) ×2 IMPLANT
PAD OB MATERNITY 4.3X12.25 (PERSONAL CARE ITEMS) ×2 IMPLANT
PENCIL SMOKE EVAC W/HOLSTER (ELECTROSURGICAL) ×2 IMPLANT
RETRACTOR TRAXI PANNICULUS (MISCELLANEOUS) ×1 IMPLANT
RETRACTOR WND ALEXIS 25 LRG (MISCELLANEOUS) ×1 IMPLANT
RTRCTR C-SECT PINK 25CM LRG (MISCELLANEOUS) ×2 IMPLANT
RTRCTR WOUND ALEXIS 25CM LRG (MISCELLANEOUS) ×2
STRIP CLOSURE SKIN 1/2X4 (GAUZE/BANDAGES/DRESSINGS) ×2 IMPLANT
SUT MNCRL 0 VIOLET CTX 36 (SUTURE) ×2 IMPLANT
SUT MONOCRYL 0 CTX 36 (SUTURE) ×2
SUT PLAIN 2 0 XLH (SUTURE) ×2 IMPLANT
SUT VIC AB 0 CTX 36 (SUTURE) ×3
SUT VIC AB 0 CTX36XBRD ANBCTRL (SUTURE) ×3 IMPLANT
SUT VIC AB 2-0 CT1 27 (SUTURE) ×1
SUT VIC AB 2-0 CT1 TAPERPNT 27 (SUTURE) ×1 IMPLANT
SUT VIC AB 4-0 KS 27 (SUTURE) ×2 IMPLANT
TOWEL OR 17X24 6PK STRL BLUE (TOWEL DISPOSABLE) ×2 IMPLANT
TRAXI PANNICULUS RETRACTOR (MISCELLANEOUS) ×1
TRAY FOLEY W/BAG SLVR 14FR LF (SET/KITS/TRAYS/PACK) ×2 IMPLANT
WATER STERILE IRR 1000ML POUR (IV SOLUTION) ×2 IMPLANT

## 2020-01-06 NOTE — Op Note (Signed)
Cesarean Section Procedure Note   Madison Cook  01/05/2020 - 01/06/2020  Indications: bleeding with placenta previa   Pre-operative Diagnosis: placenta previa  Post-operative Diagnosis: Same   Surgeon: Surgeon(s) and Role:    * Eure, Amaryllis Dyke, MD - Primary    * Addisen Chappelle, Hoyle Sauer, MD - Fellow   Anesthesia: spinal    Estimated Blood Loss: 853 mL   Total IV Fluids: 1300 ml   Urine Output: 100 mL  Specimens: Placenta sent to pathology   Findings: Viable female infant in cephalic presentation. Clear amniotic fluid.  Intact placenta, three vessel cord.  Normal uterus, fallopian tubes and ovaries bilaterally. APGAR (1 MIN): 8   APGAR (5 MINS): 8   APGAR (10 MINS):   Weight: 3141g Arterial Cord pH: 7.291  Baby condition / location:  Nursery   Complications: no complications  Indications: Madison Cook is a 24 y.o. G3P1011 with an IUP [redacted]w[redacted]d presenting with known placenta previa and arrived to MAU with heavy bleeding.  The risks, benefits, complications, treatment options, and expected outcomes were discussed with the patient . The patient concurred with the proposed plan, giving informed consent. identified as Madison Cook and the procedure verified as C-Section Delivery.  Procedure Details: A Time Out was held and the above information confirmed.  The patient was taken back to the operative suite where anesthesia was placed.  After induction of anesthesia, the patient was draped and prepped in the usual sterile manner and placed in a dorsal supine position with a leftward tilt. A low transverse incision was made and carried down through the subcutaneous tissue to the fascia. Fascial incision was made and extended transversely. The fascia was separated from the underlying rectus tissue superiorly and inferiorly. The peritoneum was identified and entered. Peritoneal incision was extended longitudinally. A low transverse uterine incision was made. Delivered from cephalic  presentation was a 3141 gram Female with Apgar scores of 8 at one minute and 8 at five minutes. Cord ph was sent; the umbilical cord was clamped and cut cord blood was obtained for evaluation. The placenta was removed Intact and appeared normal. The uterine outline, tubes and ovaries appeared normal}. The uterine incision was closed with running locked sutures of 0 Monocryl in two layers.   Hemostasis was observed. Lavage was carried out until clear. Peritoneum and muscle re-approximated with one suture of 2-0 Chromic. The fascia was then reapproximated with running sutures of 0 Vicryl. The subcuticular closure was performed using 2-0 plain gut. The skin was closed with 4-0 Vicryl.   Instrument, sponge, and needle counts were correct prior the abdominal closure and were correct at the conclusion of the case.    Disposition: PACU - hemodynamically stable.   Maternal Condition: stable

## 2020-01-06 NOTE — MAU Note (Signed)
Pt reports to MAU c/o heavy vaginal bleeding that started around 2330. Pt reports she has a placenta previa. Pt reports ctx that just started. +FM. Pt last ate at 2330.

## 2020-01-06 NOTE — H&P (Signed)
Madison Cook is a 24 y.o. female G3P1011 at [redacted]w[redacted]d presenting for vaginal bleeding with known placenta previa. Her c-section was scheduled for later this morning. Patient reports vaginal bleeding started occurring around 2330 last night. Patient describes the bleeding as large bright red where she needed to wear a towel to MAU. She reports having some occasional contractions over the past 2 days but are "mild", rates 3/10. Patient reports she had some elevated BPs at home since yesterday. Reports them being 146/109 then 145/105, patient denies HA, vision changes or RUQ pain. She reports good fetal movement. Patient was admitted in January for bleeding previa and completed a course of BMZ on 1/10-1/11 and 2/14 and 2/15. Patient with prenatal care at [facility] complicated by placenta previa, maternal obesity and short interval between pregnancies.   OB History    Gravida  3   Para  1   Term  1   Preterm      AB  1   Living  1     SAB  1   TAB      Ectopic      Multiple      Live Births  1          Past Medical History:  Diagnosis Date  . Asthma   . Pregnancy induced hypertension    Past Surgical History:  Procedure Laterality Date  . WISDOM TOOTH EXTRACTION     Family History: family history includes Hemophilia in her paternal grandfather. Social History:  reports that she has never smoked. She has never used smokeless tobacco. She reports that she does not drink alcohol or use drugs.     Maternal Diabetes: No Genetic Screening: Declined Maternal Ultrasounds/Referrals: Normal Fetal Ultrasounds or other Referrals:  None Maternal Substance Abuse:  No Significant Maternal Medications:  ASA Significant Maternal Lab Results:  Rh negative s/p rhogam 10/14/19 Other Comments:  None  Review of Systems  See pertinent in HPI History   Blood pressure (!) 143/105, pulse (!) 118, temperature 98.2 F (36.8 C), temperature source Oral, last menstrual period 05/21/2019, unknown  if currently breastfeeding. Exam Physical Exam  GENERAL: Well-developed, well-nourished female in no acute distress.  LUNGS: Clear to auscultation bilaterally.  HEART: Tachycardic  ABDOMEN: Soft, nontender, gravid PELVIC: Normal external female genitalia. Heavy bleeding noted EXTREMITIES: No cyanosis, clubbing, or edema, 2+ distal pulses.  FHT: baseline 145, mod variability, 15x15 accels, no decels TOCO: q2-72m  Prenatal labs: ABO, Rh: --/--/PENDING (04/05 0105) Antibody: PENDING (04/05 0105) Rubella: 3.20 (11/19 1511) RPR: NON REACTIVE (04/03 0950)  HBsAg: Negative (11/19 1511)  HIV: NON REACTIVE (04/03 0950)  GBS: Positive/-- (03/18 1500)   Assessment/Plan: 24 yo G3P1011 at [redacted]w[redacted]d with placenta previa with hemorrhage. The risks of cesarean section discussed with the patient included but were not limited to: bleeding which may require transfusion or reoperation; infection which may require antibiotics; injury to bowel, bladder, ureters or other surrounding organs; injury to the fetus; need for additional procedures including hysterectomy in the event of a life-threatening hemorrhage; placental abnormalities wth subsequent pregnancies, incisional problems, thromboembolic phenomenon and other postoperative/anesthesia complications. The patient concurred with the proposed plan, giving informed written consent for the procedure.   Patient has been NPO since 2330 she will remain NPO for procedure. Anesthesia and OR aware. Preoperative prophylactic antibiotics and SCDs ordered on call to the OR.  To OR when ready.  Elika Godar N Aylyn Wenzler 01/06/2020, 1:31 AM

## 2020-01-06 NOTE — Discharge Summary (Addendum)
Postpartum Discharge Summary     Patient Name: Madison Cook DOB: August 23, 1996 MRN: [id number]  Date of admission: 01/05/2020 Delivering Provider: Florian Buff   Date of discharge: 01/09/2020  Admitting diagnosis: Labor and delivery, indication for care [O75.9] Intrauterine pregnancy: [redacted]w[redacted]d    Secondary diagnosis:  Active Problems:   BMI 40.0-44.9, adult ([facility]   Obesity in pregnancy   Rh negative state in antepartum period   GBS (group B Streptococcus carrier), +RV culture, currently pregnant   History of gestational hypertension   Short interval between pregnancies affecting pregnancy, antepartum   Late prenatal care   Placenta previa   Marginal insertion of umbilical cord affecting management of mother   Anemia in pregnancy   Labor and delivery, indication for care  Additional problems: None     Discharge diagnosis: Preterm Pregnancy Delivered and postpartum hypertension                                                                                                Post partum procedures:rhogam  Augmentation: NA  Complications: None  Hospital course:  Scheduled C/S- performed same day but earlier than planned:  24y.o. yo G3P1011 at 355w2das admitted to the hospital 01/05/2020 for urgent cesarean section as she presented to MAU with heavy bleeding in setting of known placenta previa. Membrane Rupture Time/Date: 2:22 AM ,01/06/2020   Patient delivered a Viable infant.01/06/2020  Details of operation can be found in separate operative note.  Pateint had an uncomplicated postpartum course. Hx of gHTN, BP's monitored and patient was started on low dose (69m50mEnalapril prior to discharge due to elevated BPs (< severe range, no s/s pre-e).  She is ambulating, tolerating a regular diet, passing flatus, and urinating well. Patient is discharged home in stable condition on  01/09/20        Delivery time: 2:22 AM    Magnesium Sulfate received: No BMZ received: Yesx2 series (on  previous admissions) Rhophylac:Yes MMR:N/A Transfusion:No  Physical exam  Vitals:   01/08/20 1943 01/08/20 2313 01/09/20 0132 01/09/20 0527  BP: (!) 140/97 (!) 143/85  (!) 135/97  Pulse: 97 91  96  Resp: 18 16  17   Temp: 98.7 F (37.1 C) 98.2 F (36.8 C)    TempSrc: Oral Oral    SpO2: 100% 98%  100%  Weight:   115 kg   Height:   5' 3"  (1.6 m)    General: alert and no distress Lochia: appropriate Uterine Fundus: firm Incision: Dressing is clean, dry, and intact DVT Evaluation: No evidence of DVT seen on physical exam. No cords or calf tenderness. No significant calf/ankle edema. Labs: Lab Results  Component Value Date   WBC 18.8 (H) 01/06/2020   HGB 9.3 (L) 01/06/2020   HCT 28.9 (L) 01/06/2020   MCV 91.5 01/06/2020   PLT 201 01/06/2020   CMP Latest Ref Rng & Units 01/06/2020  Glucose 70 - 99 mg/dL 108(H)  BUN 6 - 20 mg/dL 7  Creatinine 0.44 - 1.00 mg/dL 0.73  Sodium 135 - 145 mmol/L 140  Potassium 3.5 - 5.1 mmol/L 3.8  Chloride 98 - 111 mmol/L 107  CO2 22 - 32 mmol/L 22  Calcium 8.9 - 10.3 mg/dL 8.4(L)  Total Protein 6.5 - 8.1 g/dL 5.3(L)  Total Bilirubin 0.3 - 1.2 mg/dL 0.4  Alkaline Phos 38 - 126 U/L 74  AST 15 - 41 U/L 15  ALT 0 - 44 U/L 8   Edinburgh Score: Edinburgh Postnatal Depression Scale Screening Tool 01/07/2020  I have been able to laugh and see the funny side of things. 0  I have looked forward with enjoyment to things. 0  I have blamed myself unnecessarily when things went wrong. 0  I have been anxious or worried for no good reason. 0  I have felt scared or panicky for no good reason. 0  Things have been getting on top of me. 0  I have been so unhappy that I have had difficulty sleeping. 0  I have felt sad or miserable. 0  I have been so unhappy that I have been crying. 0  The thought of harming myself has occurred to me. 0  Edinburgh Postnatal Depression Scale Total 0    Discharge instruction: per After Visit Summary and "Baby and Me  Booklet".  After visit meds:  Allergies as of 01/09/2020   No Known Allergies     Medication List    STOP taking these medications   aspirin EC 81 MG tablet     TAKE these medications   albuterol 108 (90 Base) MCG/ACT inhaler Commonly known as: VENTOLIN HFA Inhale 2 puffs into the lungs every 6 (six) hours as needed for wheezing or shortness of breath.   calcium carbonate 500 MG chewable tablet Commonly known as: TUMS - dosed in mg elemental calcium Chew 3-5 tablets by mouth 5 (five) times daily as needed for indigestion or heartburn.   cyanocobalamin 1000 MCG tablet Take 1 tablet (1,000 mcg total) by mouth daily.   docusate sodium 100 MG capsule Commonly known as: COLACE Take 1 capsule (100 mg total) by mouth 2 (two) times daily as needed for mild constipation.   enalapril 5 MG tablet Commonly known as: VASOTEC Take 1 tablet (5 mg total) by mouth daily.   oxyCODONE 5 MG immediate release tablet Commonly known as: Oxy IR/ROXICODONE Take 1-2 tablets (5-10 mg total) by mouth every 4 (four) hours as needed for moderate pain.   polyethylene glycol 17 g packet Commonly known as: MIRALAX / GLYCOLAX Take 17 g by mouth daily.   PrePLUS 27-1 MG Tabs Take 1 tablet by mouth daily.       Diet: routine diet  Activity: Advance as tolerated. Pelvic rest for 6 weeks.   Outpatient follow up:BP & incision check in 1-2wks; PP visit in 4-6wks Follow up Appt: Future Appointments  Date Time Provider [place]  01/21/2020  3:00 PM [facility] NURSE [facility]  02/04/2020  3:00 PM Aletha Halim, MD [facility]   Follow up Visit:  Please schedule this patient for Postpartum visit in: 4 weeks with the following provider: Any provider In-Person For C/S patients schedule nurse incision check in weeks 2 weeks: yes High risk pregnancy complicated by: previa Delivery mode:  CS Anticipated Birth Control:  other/unsure PP Procedures needed: BP and incision  check  Schedule Integrated BH visit: no   Newborn Data: Live born female  Birth Weight: 3141g APGAR: 38,8  Newborn Delivery   Birth date/time: 01/06/2020 02:22:00 Delivery type: C-Section, Low Transverse Trial of labor: No C-section categorization: Repeat      Baby  Feeding: Breast Disposition:home with mother   01/09/2020 Lyndee Hensen, DO   CNM attestation I have seen and examined this patient and agree with above documentation in the resident's note.   Tya Haughey is a 24 y.o. [phone] s/p urgent pLTCS for placenta previa w/ bleeding.   Pain is well controlled.  Plan for birth control is oral progesterone-only contraceptive.  Method of Feeding: breast Denies s/s pre-e  PE: BPs overnight: 143/85, 140/97 BP (!) 135/97 (BP Location: Right Arm)   Pulse 96   Temp 98.2 F (36.8 C) (Oral)   Resp 17   Ht 5' 3"  (1.6 m)   Wt 115 kg   LMP 05/21/2019 (Approximate)   SpO2 100%   Breastfeeding Unknown   BMI 44.91 kg/m  Fundus firm  No results for input(s): HGB, HCT in the last 72 hours.   Plan: discharge today - will start enalapril 72m prior to d/c due to elevated BPs overnight and early this morning - postpartum care discussed - f/u clinic in 1-2wks for incision and BP check; 4-6 weeks for postpartum visit  KMyrtis Ser CNM 5:03 PM

## 2020-01-06 NOTE — MAU Note (Addendum)
[facility] notified. Anesthesia notified. OB OR charge notified.  MB charge nurse notified.([phone])

## 2020-01-06 NOTE — Lactation Note (Signed)
This note was copied from a baby's chart. Lactation Consultation Note  Patient Name: Madison Cook [id number] Date: 01/06/2020 Reason for consult: Follow-up assessment;Mother's request;Late-preterm 34-36.6wks  P2 mother whose infant is now 66 hours old.  This is a LPTI at 36+2 weeks.  Mother breast fed her first child (now 12 months old) for 2 months.  Mother requested latch assistance.  Previous LC assisted 5 hours ago and reviewed the LPTI policy with mother.  Mother's breasts are soft and non tender and nipples are short shafted, everted and intact.  The left nipple has a birthmark around the nipple; it is not bruised.    Assisted mother to latch in the football hold on the left breast easily.  Mother mentioned that there was difficulty at the last feeding with baby rolling her lower lip in.  She has a recessed chin and I made a couple of suggestions as to how to assist when this happens.  Baby did not do this with this latch.  Mother denied pain with latching.  Baby has a recessed chin.  Observed baby feeding for 5 minutes before becoming restless at the breast.  Removed her from the breast, burped well and assisted with latching again.  Baby continued to suck with short pauses and mother denied pain.  Observed her feeding for 10 minutes and baby was still feeding when I left the room.  Explained to mother how to tell when baby is finished feeding.  Mother verbalized understanding.    Mother will continue to offer the breast 8-12 times/24 hours or sooner if baby shows feeding cues.  She will awaken her every three hours if she does not self awaken.  Reminded mother to continue pumping after every feeding for 15 minutes and to feed back any EBM she obtains to baby.  Father present but engaged with his phone the entire time I was in the room.  Suggested mother call her RN/LC for assistance as needed.     Maternal Data Formula Feeding for Exclusion: No Has patient been taught Hand  Expression?: Yes Does the patient have breastfeeding experience prior to this delivery?: Yes  Feeding Feeding Type: Breast Fed  LATCH Score Latch: Grasps breast easily, tongue down, lips flanged, rhythmical sucking.  Audible Swallowing: None  Type of Nipple: Everted at rest and after stimulation(short shafted)  Comfort (Breast/Nipple): Soft / non-tender  Hold (Positioning): Assistance needed to correctly position infant at breast and maintain latch.  LATCH Score: 7  Interventions Interventions: Breast feeding basics reviewed;Assisted with latch;Skin to skin;Hand express;Breast compression;Adjust position;Position options;Support pillows  Lactation Tools Discussed/Used Breast pump type: Double-Electric Breast Pump   Consult Status Consult Status: Follow-up Date: 01/07/20 Follow-up type: In-patient    Madison Cook 01/06/2020, 8:46 PM

## 2020-01-06 NOTE — Lactation Note (Signed)
This note was copied from a baby's chart. Lactation Consultation Note  Patient Name: Madison Cook [id number] Date: 01/06/2020 Reason for consult: Follow-up assessment  Baby is 57 hours old, LPTI of a P2 mother. Mother is eager to breastfeed because she had to stop BF her first baby due to poor milk supply. Mother has a 8-month old baby at home currently.   Baby was STS upon arrival. Mother requested assistance with latching baby to breast since baby was to sleepy and had not fed since 9:45am. Mother prefers football hold to right breast. Baby latched with difficulty and continue to slide off breast onto nipple. Noted baby sucked her lower lip and appears to have a recessed jaw. Corrected lower lip curling and assisted mother with positioning to attain a deeper latch.   Set up DEBP and assisted mother with pumping. Few drops of colostrum collected on flange. Baby started showing cues and offered to assist with latch again. Identified some swallows but baby lost depth quickly. Talked about nipple shield but mother is not interested at the moment due to poor experience with her first child. Baby fell sleep in the breast after 6 minutes.   Assisted mother with hand expression and able to collect 4 mL in a bullet. Mother fingerfed baby 68mL of colostrum by curved tip syringe.   Reviewed LPTI policy with mother and encouraged to contact [facility] for support and questions. Lactation brochure given to mom.   Plan: 1-Encourage mother to keep baby STS as much as possible. 2-Offer the breast with cues, goal of 8 to 12 feeding per 24 hours.  3-Hand-express colostrum to feed to baby per LPT guidelines 4-Pump both breast on initiation setting 5-Ask for help PRN   Maternal Data Formula Feeding for Exclusion: No Has patient been taught Hand Expression?: Yes Does the patient have breastfeeding experience prior to this delivery?: Yes  Feeding Feeding Type: Breast Milk  LATCH Score Latch: Repeated  attempts needed to sustain latch, nipple held in mouth throughout feeding, stimulation needed to elicit sucking reflex.  Audible Swallowing: A few with stimulation  Type of Nipple: Everted at rest and after stimulation  Comfort (Breast/Nipple): Soft / non-tender  Hold (Positioning): Assistance needed to correctly position infant at breast and maintain latch.  LATCH Score: 7  Interventions Interventions: Breast feeding basics reviewed;Assisted with latch;Skin to skin;Breast massage;Hand express;Adjust position;Support pillows;Position options;Expressed milk;DEBP  Lactation Tools Discussed/Used Tools: Pump;Flanges Flange Size: 24 Breast pump type: Double-Electric Breast Pump Pump Review: Setup, frequency, and cleaning;Milk Storage Initiated by:: Zamaria Brazzle Higuera, IBCLC Date initiated:: 01/06/20   Consult Status Consult Status: Follow-up Date: 01/07/20 Follow-up type: In-patient    Kassius Battiste A Higuera Ancidey 01/06/2020, 2:55 PM

## 2020-01-06 NOTE — Anesthesia Procedure Notes (Signed)
Spinal  Patient location during procedure: OR Start time: 01/06/2020 1:52 AM End time: 01/06/2020 2:02 AM Staffing Performed: anesthesiologist  Anesthesiologist: Elmer Picker, MD Preanesthetic Checklist Completed: patient identified, IV checked, risks and benefits discussed, surgical consent, monitors and equipment checked, pre-op evaluation and timeout performed Spinal Block Patient position: sitting Prep: DuraPrep and site prepped and draped Patient monitoring: cardiac monitor, continuous pulse ox and blood pressure Approach: midline Location: L3-4 Injection technique: single-shot Needle Needle type: Pencan  Needle gauge: 24 G Needle length: 9 cm Assessment Sensory level: T6 Additional Notes Functioning IV was confirmed and monitors were applied. Sterile prep and drape, including hand hygiene and sterile gloves were used. The patient was positioned and the spine was prepped. The skin was anesthetized with lidocaine.  Free flow of clear CSF was obtained prior to injecting local anesthetic into the CSF.  The spinal needle aspirated freely following injection.  The needle was carefully withdrawn.  The patient tolerated the procedure well.

## 2020-01-06 NOTE — Anesthesia Postprocedure Evaluation (Signed)
Anesthesia Post Note  Patient: Madison Cook  Procedure(s) Performed: CESAREAN SECTION (N/A )     Patient location during evaluation: PACU Anesthesia Type: Spinal Level of consciousness: oriented and awake and alert Pain management: pain level controlled Vital Signs Assessment: post-procedure vital signs reviewed and stable Respiratory status: spontaneous breathing, respiratory function stable and nonlabored ventilation Cardiovascular status: blood pressure returned to baseline and stable Postop Assessment: no headache, no backache, no apparent nausea or vomiting, spinal receding, patient able to bend at knees and able to ambulate Anesthetic complications: no    Last Vitals:  Vitals:   01/06/20 0500 01/06/20 0600  BP: 135/88 131/75  Pulse: 100 (!) 110  Resp: 18 17  Temp: (!) 38.2 C 37.4 C  SpO2: 100% 100%    Last Pain:  Vitals:   01/06/20 0600  TempSrc: Oral  PainSc:    Pain Goal:                   [name] A.

## 2020-01-06 NOTE — Transfer of Care (Signed)
Immediate Anesthesia Transfer of Care Note  Patient: Madison Cook  Procedure(s) Performed: CESAREAN SECTION (N/A )  Patient Location: PACU  Anesthesia Type:Spinal  Level of Consciousness: awake, alert  and oriented  Airway & Oxygen Therapy: Patient Spontanous Breathing  Post-op Assessment: Report given to RN and Post -op Vital signs reviewed and stable  Post vital signs: Reviewed and stable  Last Vitals:  Vitals Value Taken Time  BP 110/59 01/06/20 0326  Temp 36.9 C 01/06/20 0317  Pulse 142 01/06/20 0330  Resp 12 01/06/20 0330  SpO2 100 % 01/06/20 0330  Vitals shown include unvalidated device data.  Last Pain:  Vitals:   01/06/20 0317  TempSrc: Oral  PainSc:          Complications: No apparent anesthesia complications

## 2020-01-07 LAB — SURGICAL PATHOLOGY

## 2020-01-07 MED ORDER — ASCORBIC ACID 500 MG PO TABS
500.0000 mg | ORAL_TABLET | ORAL | Status: DC
  Administered 2020-01-07 – 2020-01-09 (×2): 500 mg via ORAL
  Filled 2020-01-07 (×2): qty 1

## 2020-01-07 MED ORDER — RHO D IMMUNE GLOBULIN 1500 UNIT/2ML IJ SOSY
300.0000 ug | PREFILLED_SYRINGE | Freq: Once | INTRAMUSCULAR | Status: AC
  Administered 2020-01-07: 14:00:00 300 ug via INTRAVENOUS
  Filled 2020-01-07: qty 2

## 2020-01-07 MED ORDER — FERROUS SULFATE 325 (65 FE) MG PO TABS
325.0000 mg | ORAL_TABLET | ORAL | Status: DC
  Administered 2020-01-07 – 2020-01-09 (×2): 325 mg via ORAL
  Filled 2020-01-07 (×2): qty 1

## 2020-01-07 NOTE — Lactation Note (Signed)
This note was copied from a baby's chart. Lactation Consultation Note  Patient Name: Madison Cook UYEBX'I Date: 01/07/2020  P2, 40 hor LPTI weight loss -6% being supplemented with donor breast milk. LC entered room family member assisting mom with shower. LC will return later.    Maternal Data    Feeding Feeding Type: Donor Breast Milk  LATCH Score                   Interventions    Lactation Tools Discussed/Used     Consult Status      Madison Cook 01/07/2020, 6:40 PM

## 2020-01-07 NOTE — Progress Notes (Signed)
Left AC IV removed. Insertion not previously documented.

## 2020-01-08 LAB — RH IG WORKUP (INCLUDES ABO/RH)
ABO/RH(D): A NEG
Fetal Screen: NEGATIVE
Gestational Age(Wks): 36
Unit division: 0

## 2020-01-08 NOTE — Lactation Note (Signed)
This note was copied from a baby's chart. Lactation Consultation Note  Patient Name: Girl Krystale Rinkenberger Today's Date: 01/08/2020  P2, 47 hour LPTI, weight loss -6% being supplemented with donor milk. LC entered room mom and infant asleep at this time, LC 2nd attempt to meet with mom and baby.    Maternal Data    Feeding Feeding Type: Breast Fed  LATCH Score                   Interventions    Lactation Tools Discussed/Used     Consult Status      Danelle Earthly 01/08/2020, 1:51 AM

## 2020-01-08 NOTE — Progress Notes (Addendum)
Subjective: Postpartum Day 2: Cesarean Delivery Patient reports: Pt was in good spirits and awake today. She reports that she had no troubles overnight, was tolerating PO, able to ambulate,  + flatus though no BM for two days, and had no problems voiding. She reports some tenderness around the incision site but pain was controlled through medication.  Objective: Vital signs in last 24 hours: Temp:  [97.8 F (36.6 C)-97.9 F (36.6 C)] 97.8 F (36.6 C) (04/06 2108) Pulse Rate:  [87-94] 94 (04/07 0553) Resp:  [17-18] 17 (04/07 0553) BP: (131-134)/(84-89) 134/85 (04/07 0553) SpO2:  [99 %] 99 % (04/06 2108)  Physical Exam:  General: alert, cooperative and no distress and in good spirits Lochia: appropriate Uterine Fundus: soft Incision: healing well, no significant drainage, no significant erythema DVT Evaluation: No evidence of DVT seen on physical exam.  Recent Labs    01/06/20 0602 01/06/20 1150  HGB 10.6* 9.3*  HCT 32.5* 28.9*    Assessment/Plan: Status post Cesarean section. Doing well postoperatively, with minimal pain that is controlled via medication. Continue current care.  Can discharge if baby discharges.   Laural Benes MS3 01/08/2020, 8:16 AM  RESIDENT ATTESTATION OF STUDENT NOTE   I personally evaluated this patient along with the student, and verified all aspects of the history, physical exam, and medical decision making as documented by the student. I agree with the student's documentation and have made all necessary edits.  Katha Cabal, DO PGY-1, [facility] 01/08/2020 8:31 AM     I saw and evaluated the patient. I agree with the findings and the plan of care as documented in the resident and student's note. Cont PO iron. S/p Rhogam (baby A pos). Plan for discharge tomorrow unless baby can discharge today. Passing flatus, pain well-controlled. Incision: Clean, dry, intact. Homan's negative bilaterally. Hx of gHTN with first pregnancy, vitals  stable thus far. Discussed importance of > 18 months prior to next pregnancy. Patient would like to start birth control at [facility] visit.   Jerilynn Birkenhead, MD [facility] Family Medicine Fellow, [facility] for [employer], [facility]

## 2020-01-08 NOTE — Lactation Note (Signed)
This note was copied from a baby's chart. Lactation Consultation Note:  Mother is a P2, infant is hours old and is now at 10 % wt loss.  Infant is breastfeeding and taking donor milk. Mother reports that she is having some difficulty with keeping infant latched longer that a few mins. She reports that infant pull on  And off after a few sucks. She reports that she latches her and then she arches her back and root side to side.   Assist mother with placing infant on in football hold. Infant latched well with good depth. Infant sustained latch for 15 mins. Observed frequent suckles and audible swallows.   She is active with WIC . Mother has a DEBP sat up at the bedside.   Infant sustained latch on alternat breast for 5-10 mins on and off.  Mother to offer bottle of donor milk when infant finishes with the second breast.   Plan of Care : Breastfeed infant with feeding cues Supplement infant with ebm/donor milk  according to supplemental guidelines. Pump using a DEBP after each feeding for 15-20 mins.   Mother to continue to cue base feed infant and feed at least 8-12 times or more in 24 hours and advised to allow for cluster feeding infant as needed.   Mother to continue to due STS. Mother is aware of available LC services at [facility], BFSG'S, OP Dept, and phone # for questions or concerns about breastfeeding.  Mother receptive to all teaching and plan of care.    Patient Name: Madison Cook [id number]'D Date: 01/08/2020 Reason for consult: Follow-up assessment   Maternal Data    Feeding Feeding Type: Breast Fed Nipple Type: Slow - flow  LATCH Score Latch: Grasps breast easily, tongue down, lips flanged, rhythmical sucking.  Audible Swallowing: Spontaneous and intermittent  Type of Nipple: Everted at rest and after stimulation(lt nipple has a birthmark on the interside of the nipple)  Comfort (Breast/Nipple): Filling, red/small blisters or bruises, mild/mod discomfort  Hold  (Positioning): Assistance needed to correctly position infant at breast and maintain latch.  LATCH Score: 8  Interventions Interventions: Breast compression;Adjust position;Support pillows;Position options;Expressed milk  Lactation Tools Discussed/Used     Consult Status Consult Status: Follow-up Date: 01/09/20 Follow-up type: In-patient    Stevan Born [facility] 01/08/2020, 2:17 PM

## 2020-01-09 ENCOUNTER — Other Ambulatory Visit: Payer: Self-pay

## 2020-01-09 ENCOUNTER — Encounter (HOSPITAL_COMMUNITY): Payer: Self-pay | Admitting: Obstetrics & Gynecology

## 2020-01-09 MED ORDER — OXYCODONE HCL 5 MG PO TABS: 5.0000 mg | ORAL_TABLET | ORAL | 0 refills | Status: DC | PRN

## 2020-01-09 MED ORDER — ENALAPRIL MALEATE 5 MG PO TABS: 5.0000 mg | ORAL_TABLET | Freq: Every day | ORAL | 0 refills | Status: DC

## 2020-01-09 MED ORDER — ENALAPRIL MALEATE 5 MG PO TABS
5.0000 mg | ORAL_TABLET | Freq: Every day | ORAL | Status: DC
  Administered 2020-01-09: 5 mg via ORAL
  Filled 2020-01-09: qty 1

## 2020-01-09 MED FILL — OXYCODONE HCL 5 MG TABS: 5 | 3 days supply | Qty: 20 | Fill #0

## 2020-01-09 MED FILL — ENALAPRIL MALEATE 5 MG TABS: 5 | 30 days supply | Qty: 30 | Fill #0

## 2020-01-09 NOTE — Lactation Note (Signed)
This note was copied from a baby's chart. Lactation Consultation Note  Patient Name: [name] DEYCX'K Date: 01/09/2020 Reason for consult: Follow-up assessment  P2 mother whose infant is now 46 hours old.  This is a LPTI at 36+2 weeks with a CGA of 36+5 weeks.  Mother breast fed her first child (now 59 months old) for 2 months.  Baby has a 10% weight loss this morning; unchanged from a 10% weight loss yesterday.  Baby was asleep in the bassinet when I arrived.  Pediatrician has the discharge order in place.  Mother reports that baby does not consistently latch to the breast.  Asked for a NS to take home "just in case" she needed it.  Mother received a #24 NS per her request.    Pecola Leisure has been feeding at least every three hours and shows feeding cues.  Mother plans to continue working on breast feeding and will use the NS as needed.  Discussed baby's age and weight loss and asked mother to continue supplementing with 30 mls or more after every feeding.  Mother has been using all of her own EBM now to supplement.  She had no questions related to pumping and has a DEBP for home use.  She is also a [facility] participant.  Mother has our OP phone number for questions/concerns after discharge.    She is familiar with engorgement and did not wish to review.  She has a manual pump at bedside.  Mother has ice packs at home to use as needed.  Father present and sleeping on the couch.  Suggested mother use the DEBP right before discharge to empty the breasts until she can arrive home.  Mother verbalized understanding.   Maternal Data    Feeding Feeding Type: Breast Milk Nipple Type: Slow - flow  LATCH Score                   Interventions    Lactation Tools Discussed/Used     Consult Status Consult Status: Complete Date: 01/09/20 Follow-up type: Call as needed    Wanda Cellucci R Lajarvis Italiano 01/09/2020, 10:20 AM

## 2020-01-10 DIAGNOSIS — O165 Unspecified maternal hypertension, complicating the puerperium: Secondary | ICD-10-CM

## 2020-01-10 HISTORY — DX: Unspecified maternal hypertension, complicating the puerperium: O16.5

## 2020-01-21 ENCOUNTER — Other Ambulatory Visit: Payer: Self-pay

## 2020-01-21 ENCOUNTER — Ambulatory Visit (INDEPENDENT_AMBULATORY_CARE_PROVIDER_SITE_OTHER): Payer: Commercial Managed Care - PPO | Admitting: *Deleted

## 2020-01-21 NOTE — Progress Notes (Signed)
Pt here today for a BP check and incision check.  Pt denies any issues at this time.   BP in office 121/.   Incision healing well. No drainage or redness noted.   Pt to follow up at postpartum appt and as needed.

## 2020-01-24 NOTE — Progress Notes (Signed)
121/83  Patient seen and assessed by nursing staff during this encounter. I have reviewed the chart and agree with the documentation and plan. I have also made any necessary editorial changes.  [place] Bing, MD 01/24/2020 7:36 AM

## 2020-01-31 NOTE — Telephone Encounter (Signed)
error 

## 2020-02-04 ENCOUNTER — Ambulatory Visit (INDEPENDENT_AMBULATORY_CARE_PROVIDER_SITE_OTHER): Payer: Commercial Managed Care - PPO | Admitting: Obstetrics and Gynecology

## 2020-02-04 ENCOUNTER — Other Ambulatory Visit: Payer: Self-pay

## 2020-02-04 ENCOUNTER — Encounter: Payer: Self-pay | Admitting: Obstetrics and Gynecology

## 2020-02-04 DIAGNOSIS — O165 Unspecified maternal hypertension, complicating the puerperium: Secondary | ICD-10-CM

## 2020-02-04 NOTE — Progress Notes (Signed)
    Post Partum Visit Note  Madison Cook is a 24 y.o. [id number]  S/p 4/5 pLTCS for bleeding and previa at 36wks; she was discharged to home on POD#3   I have fully reviewed the prenatal and intrapartum course.   Anesthesia: Epiudal Postpartum course has been uncomplicated. Baby is doing well at this time. Baby is feeding by breast. Bleeding small amount. Bowel function is normal. Bladder function is normal. Patient is sexually active. Contraception method is pills. Postpartum depression screening: negative.    Baby is doing well.   Review of Systems Pertinent items noted in HPI and remainder of comprehensive ROS otherwise negative.    Objective:  Blood pressure 119/78, pulse 72, weight 242 lb (109.8 kg), unknown if currently breastfeeding. NAD Abdomen: soft, obese, nttp, nd. C/d/i incision Assessment:    normal postpartum exam. Pap smear UTD  Plan:    1.  Mood and well being: doing well.   2. Infant care and feeding: d/w her re: lactational amenorrhea. Pt would liked to do the patch; she has used OCPs in the past but wants something more long ranging and after options d/w her she'd like to do the patch. Will d/c enalapril and RTC in two weeks for UPT and BP check and if both negative then can start the patch  3. Sexuality, contraception and birth spacing: no issues with sex  4. Sleep and fatigue: no issues  5. Physical Recovery  Healing well  6.  Health Maintenance UTD   [place] Bing, MD Center for [employer], [facility]

## 2020-02-18 ENCOUNTER — Other Ambulatory Visit: Payer: Self-pay

## 2020-02-18 ENCOUNTER — Ambulatory Visit (INDEPENDENT_AMBULATORY_CARE_PROVIDER_SITE_OTHER): Payer: Commercial Managed Care - PPO | Admitting: *Deleted

## 2020-02-18 DIAGNOSIS — Z3202 Encounter for pregnancy test, result negative: Secondary | ICD-10-CM | POA: Diagnosis not present

## 2020-02-18 LAB — POCT URINE PREGNANCY: Preg Test, Ur: NEGATIVE

## 2020-02-18 NOTE — Progress Notes (Signed)
Pt here today for a BP check and UPT to start on birth control  BP in office 119/83  UPT in office was negative   Will inform provider of results.

## 2020-02-19 NOTE — Progress Notes (Signed)
Patient was assessed and managed by nursing staff during this encounter. I have reviewed the chart and agree with the documentation and plan. I have also made any necessary editorial changes.  [place] Bing, MD 02/19/2020 1:28 PM

## 2020-02-20 ENCOUNTER — Other Ambulatory Visit: Payer: Self-pay | Admitting: Obstetrics and Gynecology

## 2020-02-20 MED ORDER — XULANE 150-35 MCG/24HR TD PTWK: 1.0000 | MEDICATED_PATCH | TRANSDERMAL | 12 refills | Status: DC

## 2020-03-04 ENCOUNTER — Telehealth: Payer: Self-pay

## 2020-03-04 DIAGNOSIS — Z9189 Other specified personal risk factors, not elsewhere classified: Secondary | ICD-10-CM

## 2020-03-04 NOTE — Telephone Encounter (Signed)
Received call from patient she reports seeing [facility] today and they suggested she call us concerning some postpartum depression/feeling she has been experiencing over the last couple of days.She reports she feels safe at this time. No thoughts of hurting herself or baby at this time. Patient has been scheduled for appointment on 03/04/2020. She was instructed to go to [facility] with any thoughts of hurting herself or anyone else. Patient voice understanding at this time. Referral will be place to see Jamie([facility]) to follow up as well.

## 2020-03-05 ENCOUNTER — Other Ambulatory Visit: Payer: Self-pay

## 2020-03-05 ENCOUNTER — Encounter: Payer: Self-pay | Admitting: Obstetrics and Gynecology

## 2020-03-05 ENCOUNTER — Ambulatory Visit (INDEPENDENT_AMBULATORY_CARE_PROVIDER_SITE_OTHER): Payer: Commercial Managed Care - PPO | Admitting: Obstetrics and Gynecology

## 2020-03-05 VITALS — BP 138/85 | HR 89 | Wt 238.0 lb

## 2020-03-05 DIAGNOSIS — F53 Postpartum depression: Secondary | ICD-10-CM | POA: Diagnosis not present

## 2020-03-05 DIAGNOSIS — O99345 Other mental disorders complicating the puerperium: Secondary | ICD-10-CM

## 2020-03-05 NOTE — Progress Notes (Signed)
Patient present to the office today to discuss some family issue with having a new baby.

## 2020-03-05 NOTE — Progress Notes (Signed)
Obstetrics and Gynecology Visit Return Patient Evaluation  Appointment Date: 03/05/2020  Primary Care Provider: Phys[facility], [facility]  OBGYN Clinic: Center for [facility]  Chief Complaint: PP depression  History of Present Illness:  Madison Cook is a 24 y.o. with above CC.    PHQ9 score 15, no thoughts of SI  Review of Systems:  as noted in the History of Present Illness.  Medications:  [name] had no medications administered during this visit. Current Outpatient Medications  Medication Sig Dispense Refill  . norelgestromin-ethinyl estradiol [facility]) 150-35 MCG/24HR transdermal patch Place 1 patch onto the skin once a week. 3 patch 12   No current facility-administered medications for this visit.    Allergies: has No Known Allergies.  Physical Exam:  BP 138/85   Pulse 89   Wt 238 lb (108 kg)   BMI 42.16 kg/m  Body mass index is 42.16 kg/m. General appearance: Well nourished, well developed female in no acute distress.  Neuro/Psych:  Normal mood and affect.    Assessment: pt stable  Plan:  1. Postpartum depression Patient's partner works out of town four days a week doing re-modeling. 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 It sounds like the main etiology is lack of support with patient only getting about 4 hours of sleep a day and patient's partner not helping out much when he's home, but it doesn't sound like she presses him on the issue.  I encouraged her try and get as much support as she can from him and also to see if her and her brother's partner can co manage their kids to give each other some time off; also it sounds likes both sets of grandmothers are nearby and to try and use them as well. I told her it doesn't sound the  issue is with the birth control patch and to come up with other issues she may perceive to bring up at her [facility] visit in a few days; I also told her I don't feel that medications are indicated, at this point.    RTC: PRN  Cornelia Copa MD Attending Center for [employer] [facility])

## 2020-03-05 NOTE — BH Specialist Note (Signed)
Pt did not arrive to video visit and did not answer the phone ; Left HIPPA-compliant message to call back Asher Muir from [employer] for [employer] at [facility] at [phone] [facility] office).  ; left MyChart message for patient.    Integrated Behavioral Health via Telemedicine Video Visit  03/05/2020 Madison Cook [id number]   Rae Lips

## 2020-03-09 ENCOUNTER — Ambulatory Visit: Payer: Commercial Managed Care - PPO | Admitting: Clinical

## 2020-03-09 ENCOUNTER — Other Ambulatory Visit: Payer: Self-pay

## 2020-03-09 DIAGNOSIS — Z91199 Patient's noncompliance with other medical treatment and regimen due to unspecified reason: Secondary | ICD-10-CM

## 2020-03-18 ENCOUNTER — Telehealth: Payer: Self-pay | Admitting: *Deleted

## 2020-03-18 MED ORDER — NORETHINDRONE 0.35 MG PO TABS: 1.0000 | ORAL_TABLET | Freq: Every day | ORAL | 12 refills | Status: DC

## 2020-03-18 NOTE — Telephone Encounter (Signed)
Pt called the office stating she had an elevated BP 2 days ago of 169/110 and was going to recheck her BP today. Pt is currently on the [facility] patch. Dr Vergie Living was made aware of BP and pt is too stop the patch.   Called pt back, informed to stop the patch. Pt states she currently had the patch off as this is her week off and is supposed to place another one on Sunday. BP today was 136/86. Pt states she is unable to come by office for a BP check this week. Dr Alvester Morin also made aware of pt and willing to send in POP for pt to start. Discussed with pt the options of starting the POP or making an appointment to discuss other birth control options. Pt would like to start POP and will try that and if she is unhappy with that she will make an appointment to  Change BC.

## 2020-11-01 ENCOUNTER — Emergency Department (HOSPITAL_COMMUNITY): Payer: Commercial Managed Care - PPO

## 2020-11-01 ENCOUNTER — Encounter (HOSPITAL_COMMUNITY): Payer: Self-pay | Admitting: Emergency Medicine

## 2020-11-01 ENCOUNTER — Inpatient Hospital Stay (HOSPITAL_COMMUNITY)
Admission: EM | Admit: 2020-11-01 | Discharge: 2020-11-10 | DRG: 463 | Disposition: A | Discharge status: Home Health | Payer: Commercial Managed Care - PPO | Attending: Surgery | Admitting: Surgery

## 2020-11-01 ENCOUNTER — Inpatient Hospital Stay (HOSPITAL_COMMUNITY): Payer: Commercial Managed Care - PPO | Admitting: Anesthesiology

## 2020-11-01 ENCOUNTER — Other Ambulatory Visit: Payer: Self-pay

## 2020-11-01 ENCOUNTER — Inpatient Hospital Stay: Payer: Self-pay

## 2020-11-01 ENCOUNTER — Encounter (HOSPITAL_COMMUNITY): Admission: EM | Disposition: A | Discharge status: Home Health | Payer: Self-pay | Source: Home / Self Care

## 2020-11-01 DIAGNOSIS — D62 Acute posthemorrhagic anemia: Secondary | ICD-10-CM | POA: Diagnosis not present

## 2020-11-01 DIAGNOSIS — Z79899 Other long term (current) drug therapy: Secondary | ICD-10-CM | POA: Diagnosis not present

## 2020-11-01 DIAGNOSIS — S82861A Displaced Maisonneuve's fracture of right leg, initial encounter for closed fracture: Secondary | ICD-10-CM

## 2020-11-01 DIAGNOSIS — D72829 Elevated white blood cell count, unspecified: Secondary | ICD-10-CM | POA: Diagnosis not present

## 2020-11-01 DIAGNOSIS — Y92410 Unspecified street and highway as the place of occurrence of the external cause: Secondary | ICD-10-CM | POA: Diagnosis not present

## 2020-11-01 DIAGNOSIS — U071 COVID-19: Secondary | ICD-10-CM

## 2020-11-01 DIAGNOSIS — I959 Hypotension, unspecified: Secondary | ICD-10-CM | POA: Diagnosis not present

## 2020-11-01 DIAGNOSIS — S83511A Sprain of anterior cruciate ligament of right knee, initial encounter: Secondary | ICD-10-CM | POA: Diagnosis present

## 2020-11-01 DIAGNOSIS — J9601 Acute respiratory failure with hypoxia: Secondary | ICD-10-CM | POA: Diagnosis not present

## 2020-11-01 DIAGNOSIS — Z0189 Encounter for other specified special examinations: Secondary | ICD-10-CM

## 2020-11-01 DIAGNOSIS — Z793 Long term (current) use of hormonal contraceptives: Secondary | ICD-10-CM | POA: Diagnosis not present

## 2020-11-01 DIAGNOSIS — E875 Hyperkalemia: Secondary | ICD-10-CM | POA: Diagnosis not present

## 2020-11-01 DIAGNOSIS — A0472 Enterocolitis due to Clostridium difficile, not specified as recurrent: Secondary | ICD-10-CM | POA: Diagnosis not present

## 2020-11-01 DIAGNOSIS — S32462A Displaced associated transverse-posterior fracture of left acetabulum, initial encounter for closed fracture: Principal | ICD-10-CM | POA: Diagnosis present

## 2020-11-01 DIAGNOSIS — Z23 Encounter for immunization: Secondary | ICD-10-CM | POA: Diagnosis not present

## 2020-11-01 DIAGNOSIS — R52 Pain, unspecified: Secondary | ICD-10-CM

## 2020-11-01 DIAGNOSIS — S0181XA Laceration without foreign body of other part of head, initial encounter: Secondary | ICD-10-CM

## 2020-11-01 DIAGNOSIS — M898X9 Other specified disorders of bone, unspecified site: Secondary | ICD-10-CM | POA: Diagnosis present

## 2020-11-01 DIAGNOSIS — S82401A Unspecified fracture of shaft of right fibula, initial encounter for closed fracture: Secondary | ICD-10-CM | POA: Diagnosis present

## 2020-11-01 DIAGNOSIS — S3991XA Unspecified injury of abdomen, initial encounter: Secondary | ICD-10-CM

## 2020-11-01 DIAGNOSIS — S73035A Other anterior dislocation of left hip, initial encounter: Secondary | ICD-10-CM

## 2020-11-01 DIAGNOSIS — S73005A Unspecified dislocation of left hip, initial encounter: Secondary | ICD-10-CM | POA: Diagnosis present

## 2020-11-01 DIAGNOSIS — E871 Hypo-osmolality and hyponatremia: Secondary | ICD-10-CM | POA: Diagnosis not present

## 2020-11-01 DIAGNOSIS — S9304XA Dislocation of right ankle joint, initial encounter: Secondary | ICD-10-CM | POA: Diagnosis present

## 2020-11-01 DIAGNOSIS — M25361 Other instability, right knee: Secondary | ICD-10-CM | POA: Diagnosis present

## 2020-11-01 DIAGNOSIS — F419 Anxiety disorder, unspecified: Secondary | ICD-10-CM | POA: Diagnosis present

## 2020-11-01 DIAGNOSIS — E559 Vitamin D deficiency, unspecified: Secondary | ICD-10-CM | POA: Diagnosis present

## 2020-11-01 DIAGNOSIS — Z419 Encounter for procedure for purposes other than remedying health state, unspecified: Secondary | ICD-10-CM

## 2020-11-01 DIAGNOSIS — S32402A Unspecified fracture of left acetabulum, initial encounter for closed fracture: Secondary | ICD-10-CM

## 2020-11-01 DIAGNOSIS — Z6841 Body Mass Index (BMI) 40.0 and over, adult: Secondary | ICD-10-CM | POA: Diagnosis not present

## 2020-11-01 DIAGNOSIS — S32452A Displaced transverse fracture of left acetabulum, initial encounter for closed fracture: Secondary | ICD-10-CM | POA: Diagnosis present

## 2020-11-01 DIAGNOSIS — T1490XA Injury, unspecified, initial encounter: Secondary | ICD-10-CM

## 2020-11-01 DIAGNOSIS — E876 Hypokalemia: Secondary | ICD-10-CM | POA: Diagnosis not present

## 2020-11-01 DIAGNOSIS — Z978 Presence of other specified devices: Secondary | ICD-10-CM

## 2020-11-01 HISTORY — PX: INCISION AND DRAINAGE OF WOUND: SHX1803

## 2020-11-01 HISTORY — PX: INSERTION OF TRACTION PIN: SHX6560

## 2020-11-01 LAB — CREATININE, SERUM: Creatinine, Ser: 0.77 mg/dL (ref 0.44–1.00)

## 2020-11-01 LAB — COMPREHENSIVE METABOLIC PANEL
ALT: 21 U/L (ref 0–44)
AST: 40 U/L (ref 15–41)
Albumin: 3.6 g/dL (ref 3.5–5.0)
Alkaline Phosphatase: 45 U/L (ref 38–126)
Anion gap: 13 (ref 5–15)
BUN: 12 mg/dL (ref 6–20)
CO2: 20 mmol/L — ABNORMAL LOW (ref 22–32)
Calcium: 8.7 mg/dL — ABNORMAL LOW (ref 8.9–10.3)
Chloride: 103 mmol/L (ref 98–111)
Creatinine, Ser: 0.78 mg/dL (ref 0.44–1.00)
GFR, Estimated: >60 mL/min (ref >60)
Glucose, Bld: 187 mg/dL — ABNORMAL HIGH (ref 70–99)
Potassium: 3.3 mmol/L — ABNORMAL LOW (ref 3.5–5.1)
Sodium: 136 mmol/L (ref 135–145)
Total Bilirubin: 0.7 mg/dL (ref 0.3–1.2)
Total Protein: 6.7 g/dL (ref 6.5–8.1)

## 2020-11-01 LAB — URINALYSIS, ROUTINE W REFLEX MICROSCOPIC
Bacteria, UA: NONE SEEN
Bilirubin Urine: NEGATIVE
Glucose, UA: 50 mg/dL — AB
Ketones, ur: 5 mg/dL — AB
Leukocytes,Ua: NEGATIVE
Nitrite: NEGATIVE
Protein, ur: 100 mg/dL — AB
Specific Gravity, Urine: 1.043 — ABNORMAL HIGH (ref 1.005–1.030)
pH: 5 (ref 5.0–8.0)

## 2020-11-01 LAB — I-STAT CHEM 8, ED
BUN: 14 mg/dL (ref 6–20)
Calcium, Ion: 1.07 mmol/L — ABNORMAL LOW (ref 1.15–1.40)
Chloride: 106 mmol/L (ref 98–111)
Creatinine, Ser: 0.6 mg/dL (ref 0.44–1.00)
Glucose, Bld: 185 mg/dL — ABNORMAL HIGH (ref 70–99)
HCT: 40 % (ref 36.0–46.0)
Hemoglobin: 13.6 g/dL (ref 12.0–15.0)
Potassium: 3.2 mmol/L — ABNORMAL LOW (ref 3.5–5.1)
Sodium: 138 mmol/L (ref 135–145)
TCO2: 21 mmol/L — ABNORMAL LOW (ref 22–32)

## 2020-11-01 LAB — SARS CORONAVIRUS 2 BY RT PCR (HOSPITAL ORDER, PERFORMED IN ~~LOC~~ HOSPITAL LAB): SARS Coronavirus 2: POSITIVE — AB

## 2020-11-01 LAB — CBC
HCT: 40.7 % (ref 36.0–46.0)
Hemoglobin: 12.8 g/dL (ref 12.0–15.0)
Hemoglobin: 12.1 g/dL (ref 12.0–15.0)
MCH: 27.6 pg (ref 26.0–34.0)
MCH: 28.6 pg (ref 26.0–34.0)
MCHC: 31.4 g/dL (ref 30.0–36.0)
MCV: 87.9 fL (ref 80.0–100.0)
Platelets: 405 10*3/uL — ABNORMAL HIGH (ref 150–400)
RBC: 4.63 MIL/uL (ref 3.87–5.11)
RBC: 4.23 MIL/uL (ref 3.87–5.11)
RDW: 13.8 % (ref 11.5–15.5)
RDW: 14 % (ref 11.5–15.5)
WBC: 40.2 10*3/uL — ABNORMAL HIGH (ref 4.0–10.5)
nRBC: 0 % (ref 0.0–0.2)

## 2020-11-01 LAB — TYPE AND SCREEN
ABO/RH(D): A NEG
Antibody Screen: NEGATIVE

## 2020-11-01 LAB — SAMPLE TO BLOOD BANK

## 2020-11-01 LAB — PROTIME-INR
INR: 1 (ref 0.8–1.2)
Prothrombin Time: 12.6 seconds (ref 11.4–15.2)

## 2020-11-01 LAB — I-STAT BETA HCG BLOOD, ED (MC, WL, AP ONLY): I-stat hCG, quantitative: <5 m[IU]/mL (ref <5)

## 2020-11-01 LAB — LACTIC ACID, PLASMA: Lactic Acid, Venous: 2.1 mmol/L (ref 0.5–1.9)

## 2020-11-01 LAB — ETHANOL: Alcohol, Ethyl (B): <10 mg/dL (ref <10)
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
## 2020-11-01 SURGERY — INSERTION, TRACTION PIN
Anesthesia: General | Site: Leg Upper | Laterality: Right

## 2020-11-01 MED ORDER — PANTOPRAZOLE SODIUM 40 MG IV SOLR
40.0000 mg | Freq: Every day | INTRAVENOUS | Status: DC
  Administered 2020-11-01: 40 mg via INTRAVENOUS
  Filled 2020-11-01: qty 40

## 2020-11-01 MED ORDER — PANTOPRAZOLE SODIUM 40 MG PO TBEC
40.0000 mg | DELAYED_RELEASE_TABLET | Freq: Every day | ORAL | Status: DC
  Filled 2020-11-01: qty 1

## 2020-11-01 MED ORDER — CHLORHEXIDINE GLUCONATE 0.12% ORAL RINSE (MEDLINE KIT)
15.0000 mL | Freq: Two times a day (BID) | OROMUCOSAL | Status: DC
  Administered 2020-11-01 – 2020-11-09 (×18): 15 mL via OROMUCOSAL

## 2020-11-01 MED ORDER — KETAMINE HCL 50 MG/5ML IJ SOSY
0.3000 mg/kg | PREFILLED_SYRINGE | Freq: Once | INTRAMUSCULAR | Status: DC
  Filled 2020-11-01: qty 5

## 2020-11-01 MED ORDER — FENTANYL CITRATE (PF) 100 MCG/2ML IJ SOLN
50.0000 ug | Freq: Once | INTRAMUSCULAR | Status: DC
Start: 2020-11-01 — End: 2020-11-04

## 2020-11-01 MED ORDER — HYDRALAZINE HCL 20 MG/ML IJ SOLN: 10.0000 mg | INTRAMUSCULAR | Status: DC | PRN

## 2020-11-01 MED ORDER — LACTATED RINGERS IV SOLN: INTRAVENOUS | Status: DC

## 2020-11-01 MED ORDER — DEXTROSE 5 % IV SOLN
3.0000 g | Freq: Three times a day (TID) | INTRAVENOUS | Status: DC
  Administered 2020-11-01: 3 g via INTRAVENOUS
  Filled 2020-11-01 (×8): qty 3000

## 2020-11-01 MED ORDER — PROPOFOL 1000 MG/100ML IV EMUL
INTRAVENOUS | Status: AC
  Administered 2020-11-02: 50 ug/kg/min via INTRAVENOUS
  Filled 2020-11-01: qty 100

## 2020-11-01 MED ORDER — ETOMIDATE 2 MG/ML IV SOLN
INTRAVENOUS | Status: AC
  Filled 2020-11-01: qty 10

## 2020-11-01 MED ORDER — POLYETHYLENE GLYCOL 3350 17 G PO PACK
17.0000 g | PACK | Freq: Every day | ORAL | Status: DC
  Administered 2020-11-01 – 2020-11-04 (×2): 17 g
  Filled 2020-11-01 (×3): qty 1

## 2020-11-01 MED ORDER — SUCCINYLCHOLINE CHLORIDE 20 MG/ML IJ SOLN
INTRAMUSCULAR | Status: AC | PRN
  Administered 2020-11-01: 120 mg via INTRAVENOUS

## 2020-11-01 MED ORDER — FENTANYL CITRATE (PF) 100 MCG/2ML IJ SOLN: 50.0000 ug | Freq: Once | INTRAMUSCULAR | Status: AC

## 2020-11-01 MED ORDER — ROCURONIUM BROMIDE 10 MG/ML (PF) SYRINGE
PREFILLED_SYRINGE | INTRAVENOUS | Status: DC | PRN
  Administered 2020-11-01: 50 mg via INTRAVENOUS

## 2020-11-01 MED ORDER — ONDANSETRON 4 MG PO TBDP
4.0000 mg | ORAL_TABLET | Freq: Four times a day (QID) | ORAL | Status: DC | PRN
  Administered 2020-11-06 – 2020-11-07 (×2): 4 mg via ORAL
  Filled 2020-11-01 (×2): qty 1

## 2020-11-01 MED ORDER — DOCUSATE SODIUM 50 MG/5ML PO LIQD: 100.0000 mg | Freq: Two times a day (BID) | ORAL | Status: DC

## 2020-11-01 MED ORDER — FENTANYL BOLUS VIA INFUSION
50.0000 ug | INTRAVENOUS | Status: DC | PRN
  Filled 2020-11-01: qty 50

## 2020-11-01 MED ORDER — FENTANYL CITRATE (PF) 100 MCG/2ML IJ SOLN
INTRAMUSCULAR | Status: AC
  Administered 2020-11-01: 50 ug via INTRAVENOUS
  Filled 2020-11-01: qty 2

## 2020-11-01 MED ORDER — ONDANSETRON HCL 4 MG/2ML IJ SOLN
4.0000 mg | Freq: Four times a day (QID) | INTRAMUSCULAR | Status: DC | PRN
  Administered 2020-11-03 – 2020-11-06 (×6): 4 mg via INTRAVENOUS
  Filled 2020-11-01 (×6): qty 2

## 2020-11-01 MED ORDER — ETOMIDATE 2 MG/ML IV SOLN
INTRAVENOUS | Status: AC | PRN
  Administered 2020-11-01: 20 mg via INTRAVENOUS

## 2020-11-01 MED ORDER — POLYETHYLENE GLYCOL 3350 17 G PO PACK: 17.0000 g | PACK | Freq: Every day | ORAL | Status: DC

## 2020-11-01 MED ORDER — FENTANYL 2500MCG IN NS 250ML (10MCG/ML) PREMIX INFUSION: 50.0000 ug/h | INTRAVENOUS | Status: DC

## 2020-11-01 MED ORDER — SODIUM CHLORIDE 0.9 % IR SOLN
Status: DC | PRN
  Administered 2020-11-01: 1000 mL

## 2020-11-01 MED ORDER — FENTANYL CITRATE (PF) 250 MCG/5ML IJ SOLN
INTRAMUSCULAR | Status: AC
  Filled 2020-11-01: qty 5

## 2020-11-01 MED ORDER — PROPOFOL 1000 MG/100ML IV EMUL
0.0000 ug/kg/min | INTRAVENOUS | Status: DC
  Administered 2020-11-01: 100 ug/kg/min via INTRAVENOUS
  Administered 2020-11-01 (×2): 30 ug/kg/min via INTRAVENOUS
  Administered 2020-11-02 – 2020-11-03 (×7): 50 ug/kg/min via INTRAVENOUS
  Filled 2020-11-01 (×14): qty 100

## 2020-11-01 MED ORDER — TETANUS-DIPHTH-ACELL PERTUSSIS 5-2.5-18.5 LF-MCG/0.5 IM SUSY
0.5000 mL | PREFILLED_SYRINGE | Freq: Once | INTRAMUSCULAR | Status: AC
  Administered 2020-11-01: 0.5 mL via INTRAMUSCULAR
  Filled 2020-11-01: qty 0.5

## 2020-11-01 MED ORDER — CEFAZOLIN SODIUM-DEXTROSE 2-4 GM/100ML-% IV SOLN
2.0000 g | Freq: Once | INTRAVENOUS | Status: DC
  Filled 2020-11-01: qty 100

## 2020-11-01 MED ORDER — ONDANSETRON HCL 4 MG/2ML IJ SOLN
4.0000 mg | Freq: Once | INTRAMUSCULAR | Status: AC
  Filled 2020-11-01: qty 2

## 2020-11-01 MED ORDER — ENOXAPARIN SODIUM 30 MG/0.3ML ~~LOC~~ SOLN
30.0000 mg | Freq: Two times a day (BID) | SUBCUTANEOUS | Status: DC
  Administered 2020-11-02 – 2020-11-10 (×16): 30 mg via SUBCUTANEOUS
  Filled 2020-11-01 (×16): qty 0.3

## 2020-11-01 MED ORDER — FENTANYL CITRATE (PF) 100 MCG/2ML IJ SOLN
INTRAMUSCULAR | Status: AC
  Filled 2020-11-01: qty 2

## 2020-11-01 MED ORDER — ONDANSETRON HCL 4 MG/2ML IJ SOLN
INTRAMUSCULAR | Status: AC
  Administered 2020-11-01: 4 mg via INTRAVENOUS
  Filled 2020-11-01: qty 2

## 2020-11-01 MED ORDER — SODIUM CHLORIDE 0.9 % IV SOLN: INTRAVENOUS | Status: DC | PRN

## 2020-11-01 MED ORDER — FENTANYL 2500MCG IN NS 250ML (10MCG/ML) PREMIX INFUSION
50.0000 ug/h | INTRAVENOUS | Status: DC
  Administered 2020-11-01: 250 ug/h via INTRAVENOUS
  Administered 2020-11-01: 25 ug/h via INTRAVENOUS
  Administered 2020-11-02 – 2020-11-03 (×3): 200 ug/h via INTRAVENOUS
  Filled 2020-11-01 (×4): qty 250

## 2020-11-01 MED ORDER — ACETAMINOPHEN 160 MG/5ML PO SOLN
325.0000 mg | Freq: Four times a day (QID) | ORAL | Status: DC
  Administered 2020-11-01 – 2020-11-03 (×6): 325 mg
  Filled 2020-11-01 (×6): qty 20.3

## 2020-11-01 MED ORDER — PROPOFOL 10 MG/ML IV BOLUS
INTRAVENOUS | Status: AC
  Filled 2020-11-01: qty 20

## 2020-11-01 MED ORDER — HYDROMORPHONE HCL 1 MG/ML IJ SOLN
INTRAMUSCULAR | Status: AC
  Filled 2020-11-01: qty 1

## 2020-11-01 MED ORDER — CHLORHEXIDINE GLUCONATE CLOTH 2 % EX PADS
6.0000 | MEDICATED_PAD | Freq: Every day | CUTANEOUS | Status: DC
  Administered 2020-11-03 – 2020-11-10 (×9): 6 via TOPICAL

## 2020-11-01 MED ORDER — FENTANYL CITRATE (PF) 100 MCG/2ML IJ SOLN
100.0000 ug | Freq: Once | INTRAMUSCULAR | Status: DC
  Filled 2020-11-01 (×2): qty 2

## 2020-11-01 MED ORDER — GABAPENTIN 250 MG/5ML PO SOLN
100.0000 mg | Freq: Three times a day (TID) | ORAL | Status: DC
  Administered 2020-11-01 – 2020-11-04 (×8): 100 mg
  Filled 2020-11-01 (×12): qty 2

## 2020-11-01 MED ORDER — SUCCINYLCHOLINE CHLORIDE 200 MG/10ML IV SOSY
PREFILLED_SYRINGE | INTRAVENOUS | Status: AC
  Filled 2020-11-01: qty 10

## 2020-11-01 MED ORDER — IOHEXOL 300 MG/ML  SOLN
100.0000 mL | Freq: Once | INTRAMUSCULAR | Status: AC | PRN
  Administered 2020-11-01: 100 mL via INTRAVENOUS

## 2020-11-01 MED ORDER — DOCUSATE SODIUM 50 MG/5ML PO LIQD
100.0000 mg | Freq: Two times a day (BID) | ORAL | Status: DC
  Administered 2020-11-01 – 2020-11-04 (×5): 100 mg
  Filled 2020-11-01 (×6): qty 10

## 2020-11-01 MED ORDER — ORAL CARE MOUTH RINSE
15.0000 mL | OROMUCOSAL | Status: DC
  Administered 2020-11-01 – 2020-11-04 (×27): 15 mL via OROMUCOSAL

## 2020-11-01 MED ORDER — FENTANYL BOLUS VIA INFUSION: 50.0000 ug | INTRAVENOUS | Status: DC | PRN

## 2020-11-01 MED ORDER — MIDAZOLAM HCL 2 MG/2ML IJ SOLN
INTRAMUSCULAR | Status: AC
  Filled 2020-11-01: qty 2

## 2020-11-01 SURGICAL SUPPLY — 64 items
BANDAGE ESMARK 6X9 LF (GAUZE/BANDAGES/DRESSINGS) ×3 IMPLANT
BNDG CMPR 9X6 STRL LF SNTH (GAUZE/BANDAGES/DRESSINGS) ×3
BNDG CMPR MED 15X6 ELC VLCR LF (GAUZE/BANDAGES/DRESSINGS) ×6
BNDG COHESIVE 6X5 TAN STRL LF (GAUZE/BANDAGES/DRESSINGS) IMPLANT
BNDG ELASTIC 4X5.8 VLCR STR LF (GAUZE/BANDAGES/DRESSINGS) IMPLANT
BNDG ELASTIC 6X15 VLCR STRL LF (GAUZE/BANDAGES/DRESSINGS) ×8 IMPLANT
BNDG ELASTIC 6X5.8 VLCR STR LF (GAUZE/BANDAGES/DRESSINGS) IMPLANT
BNDG ESMARK 6X9 LF (GAUZE/BANDAGES/DRESSINGS) ×4
BNDG GAUZE ELAST 4 BULKY (GAUZE/BANDAGES/DRESSINGS) IMPLANT
BOOT STEPPER DURA XLG (SOFTGOODS) ×4 IMPLANT
BRUSH SCRUB EZ  4% CHG (MISCELLANEOUS) ×1
BRUSH SCRUB EZ 4% CHG (MISCELLANEOUS) ×3 IMPLANT
CLEANER TIP ELECTROSURG 2X2 (MISCELLANEOUS) ×4 IMPLANT
COVER SURGICAL LIGHT HANDLE (MISCELLANEOUS) ×4 IMPLANT
CUFF TOURN SGL QUICK 34 (TOURNIQUET CUFF)
CUFF TRNQT CYL 34X4.125X (TOURNIQUET CUFF) IMPLANT
DRAPE C-ARM 42X72 X-RAY (DRAPES) ×4 IMPLANT
DRAPE U-SHAPE 47X51 STRL (DRAPES) ×4 IMPLANT
DRSG PAD ABDOMINAL 8X10 ST (GAUZE/BANDAGES/DRESSINGS) ×16 IMPLANT
DRSG TEGADERM 4X4.5 CHG (GAUZE/BANDAGES/DRESSINGS) ×8 IMPLANT
DRSG XEROFORM 1X8 (GAUZE/BANDAGES/DRESSINGS) ×4 IMPLANT
DURAPREP 26ML APPLICATOR (WOUND CARE) IMPLANT
ELECT REM PT RETURN 9FT ADLT (ELECTROSURGICAL) ×4
ELECTRODE REM PT RTRN 9FT ADLT (ELECTROSURGICAL) ×3 IMPLANT
EVACUATOR 1/8 PVC DRAIN (DRAIN) IMPLANT
GAUZE SPONGE 4X4 12PLY STRL (GAUZE/BANDAGES/DRESSINGS) ×8 IMPLANT
GAUZE XEROFORM 1X8 LF (GAUZE/BANDAGES/DRESSINGS) IMPLANT
GAUZE XEROFORM 5X9 LF (GAUZE/BANDAGES/DRESSINGS) ×4 IMPLANT
GLOVE BIOGEL PI IND STRL 7.5 (GLOVE) ×6 IMPLANT
GLOVE BIOGEL PI INDICATOR 7.5 (GLOVE) ×2
GLOVE ORTHO TXT STRL SZ7.5 (GLOVE) ×8 IMPLANT
GLOVE SRG 8 PF TXTR STRL LF DI (GLOVE) ×3 IMPLANT
GLOVE SURG ORTHO 8.0 STRL STRW (GLOVE) ×4 IMPLANT
GLOVE SURG UNDER POLY LF SZ8 (GLOVE) ×4
GOWN STRL REUS W/ TWL LRG LVL3 (GOWN DISPOSABLE) ×9 IMPLANT
GOWN STRL REUS W/TWL LRG LVL3 (GOWN DISPOSABLE) ×12
KIT BASIN OR (CUSTOM PROCEDURE TRAY) ×4 IMPLANT
KIT TURNOVER KIT B (KITS) ×4 IMPLANT
MANIFOLD NEPTUNE II (INSTRUMENTS) ×4 IMPLANT
NEEDLE 22X1 1/2 (OR ONLY) (NEEDLE) IMPLANT
NS IRRIG 1000ML POUR BTL (IV SOLUTION) ×4 IMPLANT
PACK ORTHO EXTREMITY (CUSTOM PROCEDURE TRAY) ×4 IMPLANT
PAD ARMBOARD 7.5X6 YLW CONV (MISCELLANEOUS) ×8 IMPLANT
PADDING CAST COTTON 6X4 STRL (CAST SUPPLIES) IMPLANT
SOL PREP POV-IOD 4OZ 10% (MISCELLANEOUS) ×4 IMPLANT
SOL PREP PROV IODINE SCRUB 4OZ (MISCELLANEOUS) ×4 IMPLANT
SPONGE LAP 18X18 RF (DISPOSABLE) ×4 IMPLANT
STAPLER VISISTAT 35W (STAPLE) IMPLANT
STOCKINETTE IMPERVIOUS LG (DRAPES) IMPLANT
STRIP CLOSURE SKIN 1/2X4 (GAUZE/BANDAGES/DRESSINGS) IMPLANT
SUCTION FRAZIER HANDLE 10FR (MISCELLANEOUS)
SUCTION TUBE FRAZIER 10FR DISP (MISCELLANEOUS) IMPLANT
SUT ETHILON 3 0 PS 1 (SUTURE) ×4 IMPLANT
SUT VIC AB 0 CT1 27 (SUTURE)
SUT VIC AB 0 CT1 27XBRD ANBCTR (SUTURE) IMPLANT
SUT VIC AB 2-0 CT1 27 (SUTURE)
SUT VIC AB 2-0 CT1 TAPERPNT 27 (SUTURE) IMPLANT
SYR CONTROL 10ML LL (SYRINGE) IMPLANT
TOWEL GREEN STERILE (TOWEL DISPOSABLE) ×8 IMPLANT
TOWEL GREEN STERILE FF (TOWEL DISPOSABLE) ×8 IMPLANT
TUBE CONNECTING 12X1/4 (SUCTIONS) ×4 IMPLANT
UNDERPAD 30X36 HEAVY ABSORB (UNDERPADS AND DIAPERS) ×4 IMPLANT
WATER STERILE IRR 1000ML POUR (IV SOLUTION) ×8 IMPLANT
YANKAUER SUCT BULB TIP NO VENT (SUCTIONS) ×4 IMPLANT

## 2020-11-01 NOTE — Progress Notes (Signed)
Pt has been on 30 mcg propofol. This RN noted rate to be too high for orders. Pt weight was set to 203 kg on pump instead of 106 kg. This RN changed the weight to 106 kg. Prop now running at a rate of 19.1 ml/hr.
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
## 2020-11-01 NOTE — ED Notes (Addendum)
Patient in CT, accompanied by EDRN x2,

## 2020-11-01 NOTE — Progress Notes (Signed)
Phlebotomy & RN unable to draw labs dt pt's poor vasculature. MD notified, order for A-line. RT unsuccessful with A-line placement, MD notified, orders for PICC placement.
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
## 2020-11-01 NOTE — ED Notes (Addendum)
Ketamine 18 mg, per Dr. Loralee Pacas verbal order for sedation, post intubation.

## 2020-11-01 NOTE — Progress Notes (Signed)
Orthopedic Tech Progress Note Patient Details:  MAKALIA BARE 04/18/1996 [id number]  Ortho Devices Type of Ortho Device: CAM walker Ortho Device/Splint Location: RLE Ortho Device/Splint Interventions: Ordered,Application,Adjustment   Post Interventions Patient Tolerated: Well Instructions Provided: Other (comment)   Michelle Piper 11/01/2020, 2:34 PM

## 2020-11-01 NOTE — ED Notes (Signed)
Patient transported to CT 

## 2020-11-01 NOTE — Progress Notes (Signed)
Orthopedic Tech Progress Note Patient Details:  INNOCENCE SCHLOTZHAUER 1996/02/05 [id number] Level 1 Patient ID: [name], female   DOB: 05/31/1996, 25 y.o.   MRN: [id number]   Michelle Piper 11/01/2020, 8:32 AM

## 2020-11-01 NOTE — Progress Notes (Signed)
Patient intubated by ED physician with a 7.5 ETT taped at 22 cm at lip, good color change on ETCO2 detector, SATS 100%, placed on above vent settings per [facility] net protocol.

## 2020-11-01 NOTE — ED Notes (Signed)
IVF changed to warm LR. Additional warm blankets applied
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
## 2020-11-01 NOTE — Procedures (Signed)
Laceration Repair Procedure Note  Pre-operative Diagnosis: Forehead laceration  Post-operative Diagnosis: same  Indications: close laceration, control bleeding, optimize healing  Anesthesia: not needed  Procedure Details  Patient was intubated and sedated.  The skin was sterilely prepped and draped over the affected area in the usual fashion. 6 cm laceration to left brow rinsed copiously with saline and probed for foreign bodies. Wound approximated with #7 sutures. 5-0 vicryl used. Wound cleansed with saline again and dry dressing applied.   Findings: Simple laceration, no foreign bodies   EBL: <5 cc's   Condition: Tolerated procedure well and Stable   Complications: none.  Juliet Rude, [facility] Surgery 11/01/2020, 10:02 AM Please see Amion for pager number during day hours 7:00am-4:30pm

## 2020-11-01 NOTE — Progress Notes (Signed)
Orthopedic Tech Progress Note Patient Details:  Madison Cook June 22, 1996 [id number]  Musculoskeletal Traction Type of Traction: Bucks Skin Traction Traction Location: RLE Traction Weight: 15 lbs   Post Interventions Patient Tolerated: Other (comment) Instructions Provided: Other (comment)   Michelle Piper 11/01/2020, 6:33 PM
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
## 2020-11-01 NOTE — Progress Notes (Signed)
RT attempted, x3, to place A-line. RT was unsuccessful.

## 2020-11-01 NOTE — Progress Notes (Signed)
Patient arrived to [id number] with belongings including only clothing.
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
## 2020-11-01 NOTE — Brief Op Note (Signed)
11/01/2020  2:54 PM  PATIENT:  Madison Cook  25 y.o. female  PRE-OPERATIVE DIAGNOSIS: 1. Comminuted left acetabular fracture with hip dislocation, 2. Right ankle dislocation (reduced in ER), 3. Right medial leg wound (?penetrating), 4. Right proximal fibula fracture  POST-OPERATIVE DIAGNOSIS:  1. Comminuted left acetabular fracture with hip dislocation, 2. Right ankle dislocation (reduced in ER), 3. Right medial leg wound (?penetrating), 4. Right proximal fibula fracture  PROCEDURE:  Procedure(s): 1.  CLOSED REDUCTION LEFT HIP  2.  INSERTION OF TRACTION PIN  3.  IRRIGATION AND DEBRIDEMENT WOUND (Right)  SURGEON:  Surgeon(s) and Role:    * Durene Romans, MD - Primary  PHYSICIAN ASSISTANT: None  ANESTHESIA:   general  EBL:  Minimal   BLOOD ADMINISTERED:none  DRAINS: none   LOCAL MEDICATIONS USED:  NONE  SPECIMEN:  No Specimen  DISPOSITION OF SPECIMEN:  N/A  COUNTS:  YES  TOURNIQUET:  * No tourniquets in log *  DICTATION: .Other Dictation: Dictation Number [id number]  PLAN OF CARE: Admit to inpatient   PATIENT DISPOSITION:  PACU - hemodynamically stable.   Delay start of Pharmacological VTE agent (>24hrs) due to surgical blood loss or risk of bleeding: no
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
## 2020-11-01 NOTE — Progress Notes (Signed)
Spoke with RN re PICC order, mostly needed for labs, has adequate access for meds at this time.  RN aware PICC will not be placed tonight.

## 2020-11-01 NOTE — Transfer of Care (Signed)
Immediate Anesthesia Transfer of Care Note  Patient: Madison Cook  Procedure(s) Performed: CLOSED REDUCTION LEFT HIP AND INSERTION OF TRACTION PIN (Left Leg Upper) IRRIGATION AND DEBRIDEMENT WOUND (Right Leg Lower)  Patient Location: ICU  Anesthesia Type:General  Level of Consciousness: Sedated  Airway & Oxygen Therapy: Patient remains intubated per anesthesia plan and Patient placed on Ventilator (see vital sign flow sheet for setting)  Post-op Assessment: Report given to RN and Post -op Vital signs reviewed and stable  Post vital signs: Reviewed and stable  Last Vitals:  Vitals Value Taken Time  BP 104/72 11/01/20 1150  Temp 35.6 C 11/01/20 1151  Pulse 65 11/01/20 1151  Resp 20 11/01/20 1151  SpO2 100 % 11/01/20 1151  Vitals shown include unvalidated device data.  Last Pain:  Vitals:   11/01/20 0955  TempSrc:   PainSc: 0-No pain         Complications: No complications documented.
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
## 2020-11-01 NOTE — Progress Notes (Signed)
CRITICAL VALUE ALERT  Critical Value:  Lactic Acid 2.1 Date & Time Notied:  11/01/20 1925  Provider Notified: Dr. Bedelia Person   Orders Received/Actions taken: No new orders

## 2020-11-02 ENCOUNTER — Inpatient Hospital Stay (HOSPITAL_COMMUNITY): Payer: Commercial Managed Care - PPO

## 2020-11-02 ENCOUNTER — Inpatient Hospital Stay (HOSPITAL_COMMUNITY): Payer: Commercial Managed Care - PPO | Admitting: Certified Registered Nurse Anesthetist

## 2020-11-02 ENCOUNTER — Encounter (HOSPITAL_COMMUNITY): Admission: EM | Disposition: A | Discharge status: Home Health | Payer: Self-pay | Source: Home / Self Care

## 2020-11-02 ENCOUNTER — Encounter (HOSPITAL_COMMUNITY): Payer: Self-pay | Admitting: Orthopedic Surgery

## 2020-11-02 HISTORY — PX: OPEN REDUCTION INTERNAL FIXATION ACETABULUM POSTERIOR LATERAL: SHX6834

## 2020-11-02 LAB — COMPREHENSIVE METABOLIC PANEL
ALT: 33 U/L (ref 0–44)
AST: 91 U/L — ABNORMAL HIGH (ref 15–41)
Albumin: 2.5 g/dL — ABNORMAL LOW (ref 3.5–5.0)
Alkaline Phosphatase: 33 U/L — ABNORMAL LOW (ref 38–126)
Anion gap: 10 (ref 5–15)
BUN: 6 mg/dL (ref 6–20)
CO2: 20 mmol/L — ABNORMAL LOW (ref 22–32)
Calcium: 7.4 mg/dL — ABNORMAL LOW (ref 8.9–10.3)
Chloride: 102 mmol/L (ref 98–111)
Creatinine, Ser: 0.7 mg/dL (ref 0.44–1.00)
GFR, Estimated: >60 mL/min (ref >60)
Glucose, Bld: 99 mg/dL (ref 70–99)
Potassium: 3.1 mmol/L — ABNORMAL LOW (ref 3.5–5.1)
Sodium: 132 mmol/L — ABNORMAL LOW (ref 135–145)
Total Bilirubin: 0.9 mg/dL (ref 0.3–1.2)
Total Protein: 4.9 g/dL — ABNORMAL LOW (ref 6.5–8.1)

## 2020-11-02 LAB — CBC
HCT: 30.1 % — ABNORMAL LOW (ref 36.0–46.0)
HCT: 26.2 % — ABNORMAL LOW (ref 36.0–46.0)
Hemoglobin: 9.9 g/dL — ABNORMAL LOW (ref 12.0–15.0)
Hemoglobin: 8.6 g/dL — ABNORMAL LOW (ref 12.0–15.0)
MCH: 28.8 pg (ref 26.0–34.0)
MCH: 28.3 pg (ref 26.0–34.0)
MCHC: 32.9 g/dL (ref 30.0–36.0)
MCHC: 32.8 g/dL (ref 30.0–36.0)
MCV: 87.5 fL (ref 80.0–100.0)
MCV: 86.2 fL (ref 80.0–100.0)
Platelets: 240 10*3/uL (ref 150–400)
Platelets: 237 10*3/uL (ref 150–400)
RBC: 3.44 MIL/uL — ABNORMAL LOW (ref 3.87–5.11)
RBC: 3.04 MIL/uL — ABNORMAL LOW (ref 3.87–5.11)
RDW: 14.1 % (ref 11.5–15.5)
RDW: 14.2 % (ref 11.5–15.5)
WBC: 7.5 10*3/uL (ref 4.0–10.5)
WBC: 9.9 10*3/uL (ref 4.0–10.5)
nRBC: 0 % (ref 0.0–0.2)
nRBC: 0 % (ref 0.0–0.2)

## 2020-11-02 LAB — TRIGLYCERIDES
Triglycerides: 1302 mg/dL — ABNORMAL HIGH (ref <150)
Triglycerides: 210 mg/dL — ABNORMAL HIGH (ref <150)

## 2020-11-02 IMAGING — DX DG PELVIS 3+V JUDET
3 series · 4 of 4 positions shown · non-contrast
Comparison: CT [DATE], radiographs [DATE], [DATE]

CLINICAL DATA: Post left acetabular ORIF

EXAM:
JUDET PELVIS - 3+ VIEW

[pelvis ap]
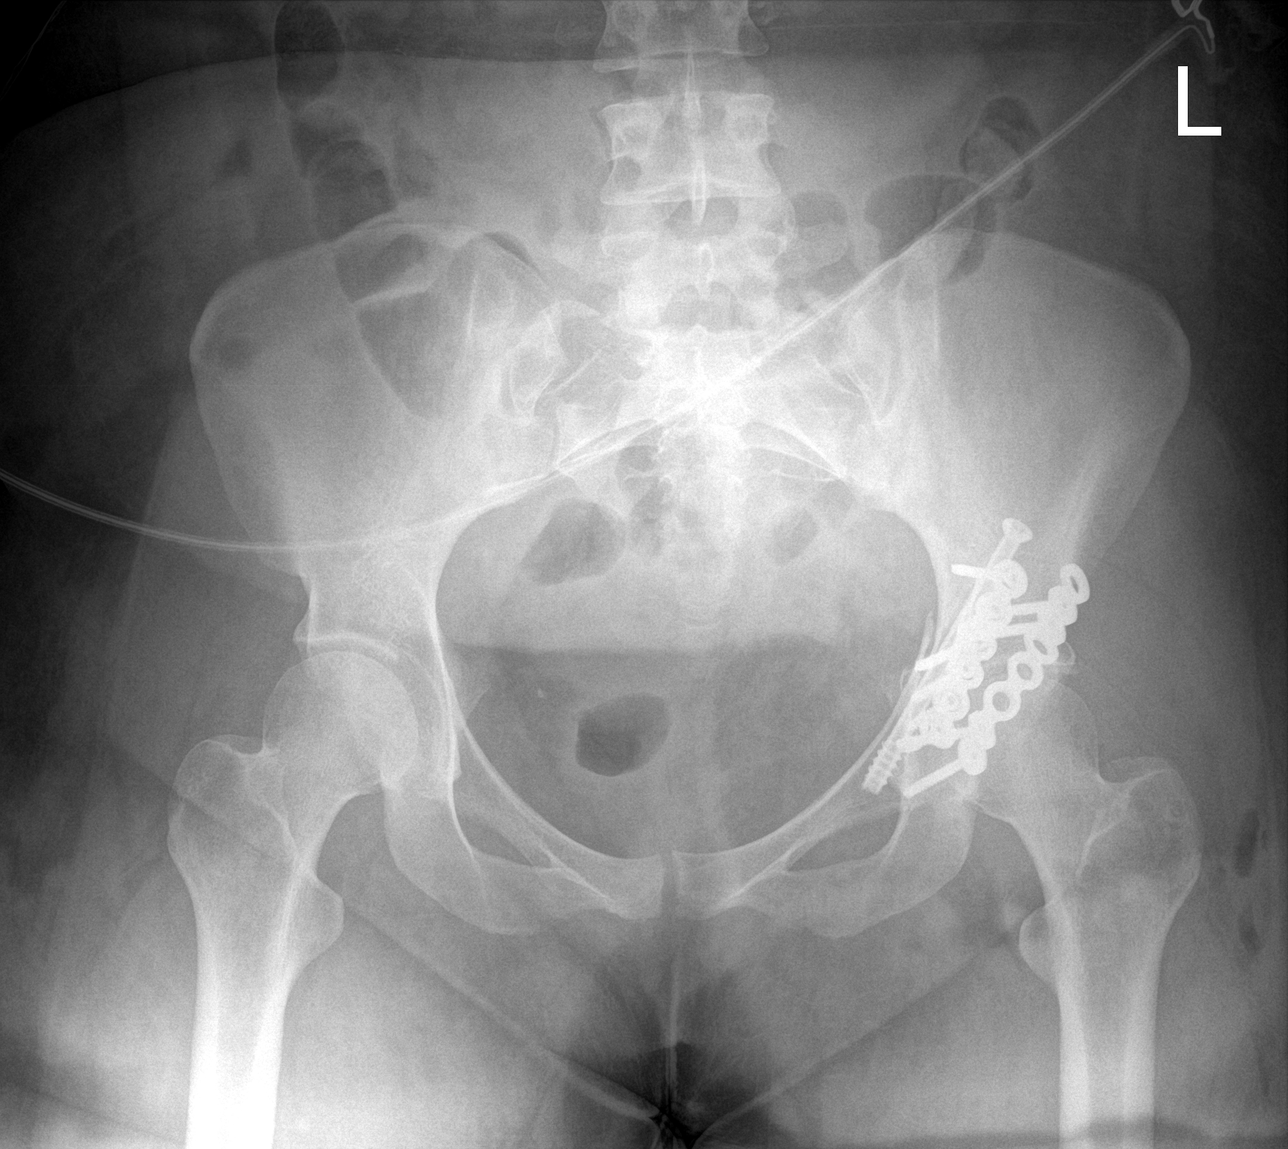

[pelvis obl (1 of 2)]
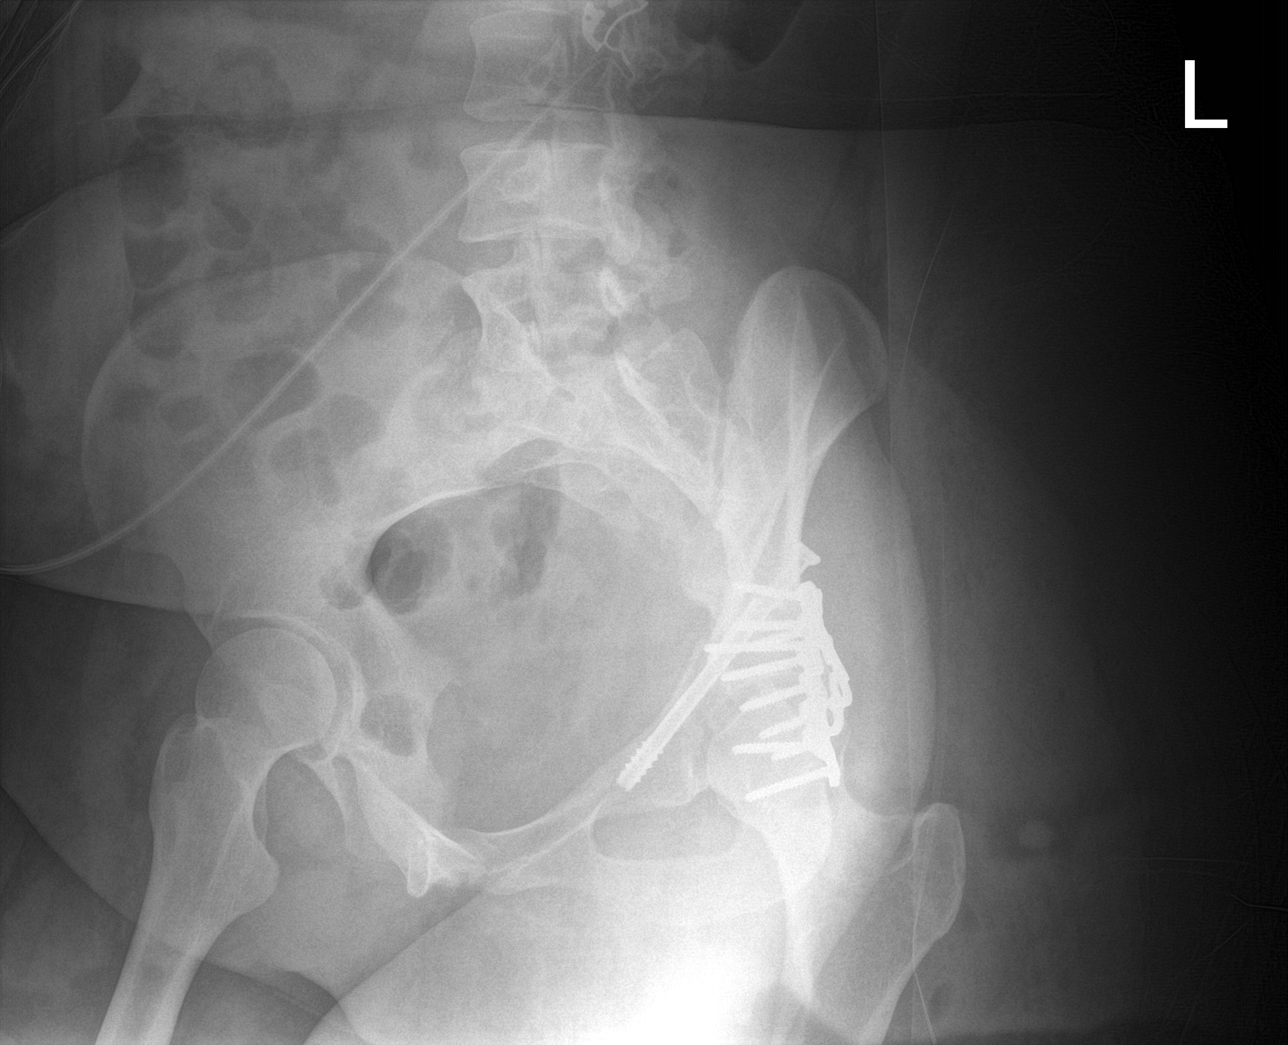

[Series 3: pelvis obl · 0.14mm/px · 2 of 2 slices shown (2 of 2)]
[im 1/2]
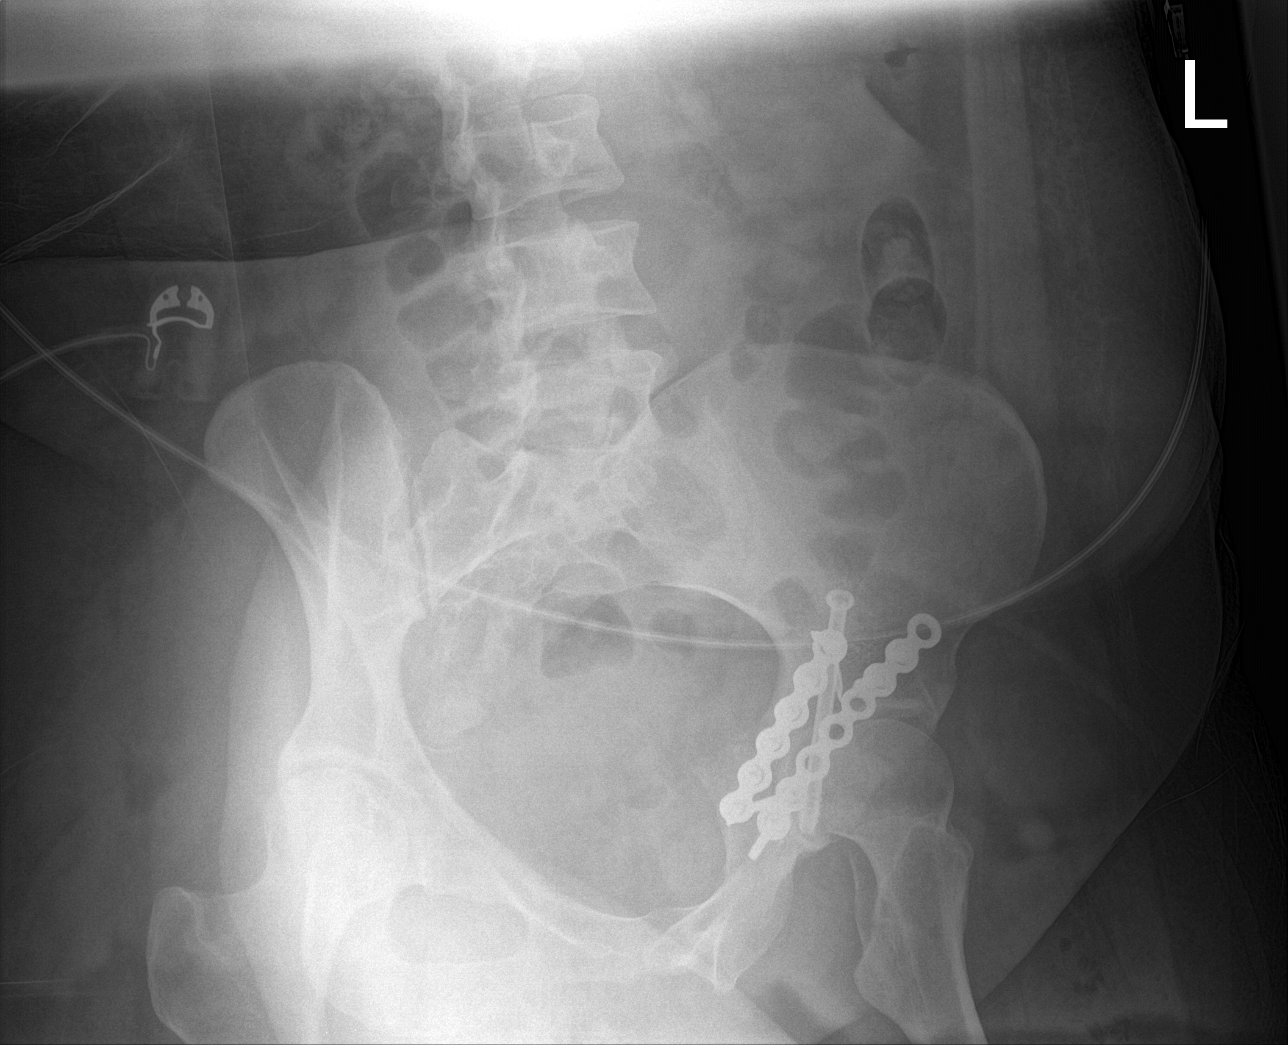
[im 2/2]
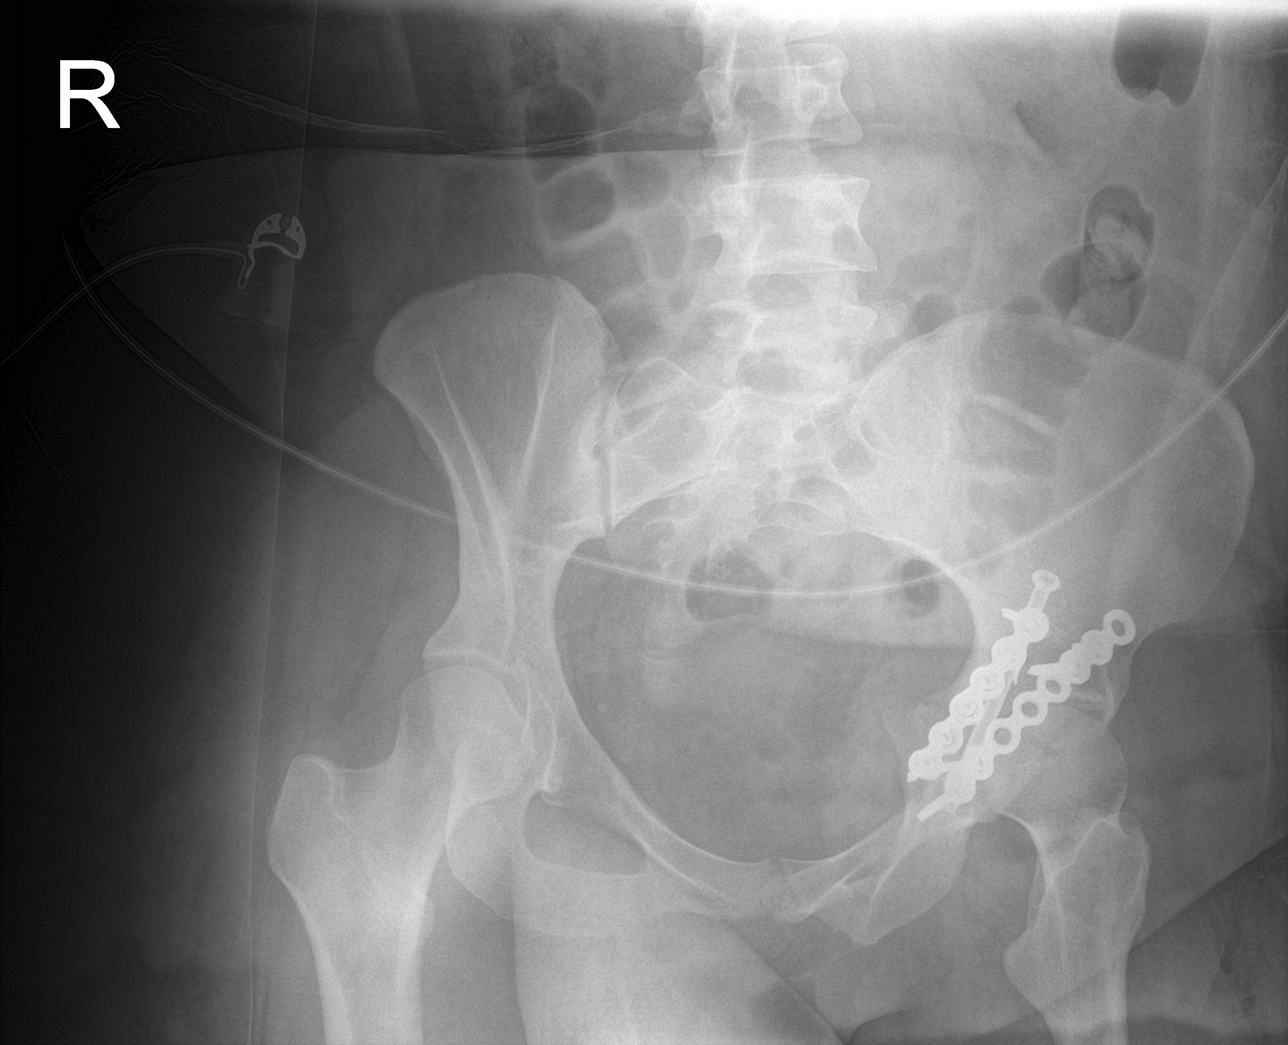

[4 of 4 positions shown; findings below may reference images not displayed]

FINDINGS: Patient is post ORIF of the complex left acetabular fracture seen on
comparison CT and radiography. Placement of 2 separate plate and
screw fixation construct as well as a partially threaded, fully
cannulated screw with intact hardware and no radiographic evidence
of acute complication. Minimal persistent fracture displacement
along the iliopectineal line otherwise, alignment is near anatomic
with normal location of the femoral head. No new fractures are
identified. Expected postsurgical soft tissue changes of the left
hip.
IMPRESSION: Status post ORIF of the complex left acetabular fracture without
evidence of acute complication.
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
## 2020-11-02 IMAGING — RF DG C-ARM 1-60 MIN
1 series · 12 of 12 positions shown · non-contrast
Comparison: Preoperative CT.

CLINICAL DATA: ORIF left hip fracture.

EXAM:
OPERATIVE LEFT HIP (WITH PELVIS IF PERFORMED)
TECHNIQUE: Fluoroscopic spot image(s) were submitted for interpretation
post-operatively.

[Series 1: run · 12 of 12 slices shown]
[im 1/12]
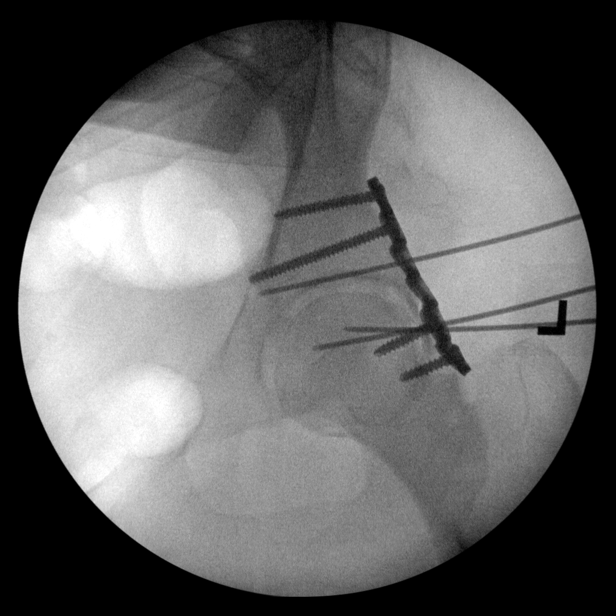
[im 2/12]
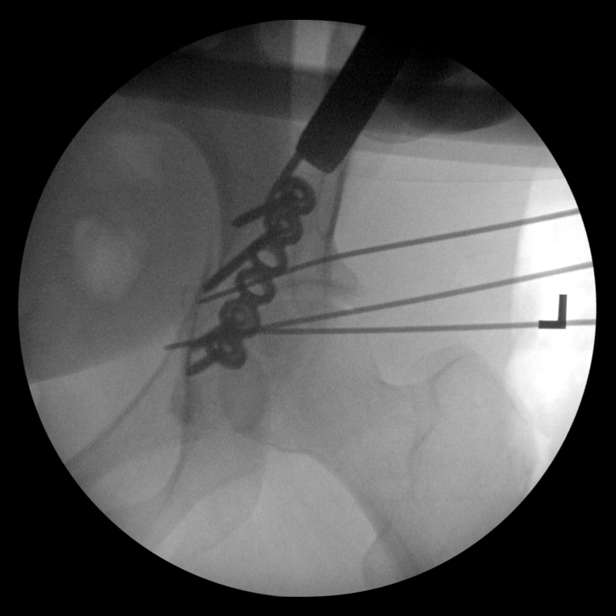
[im 3/12]
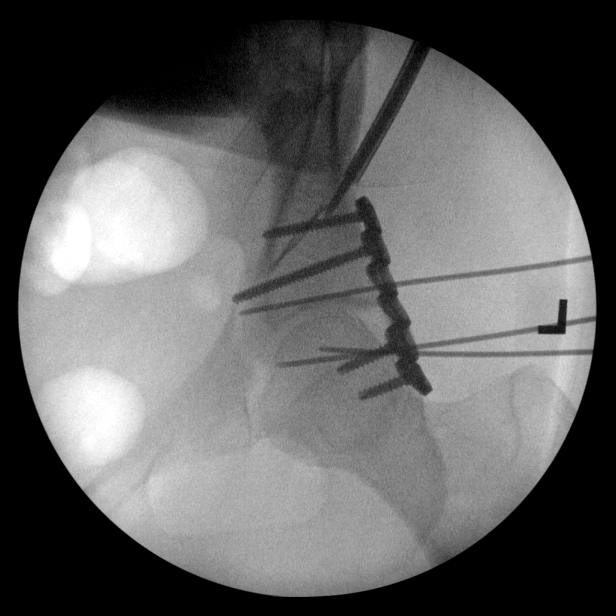
[im 4/12]
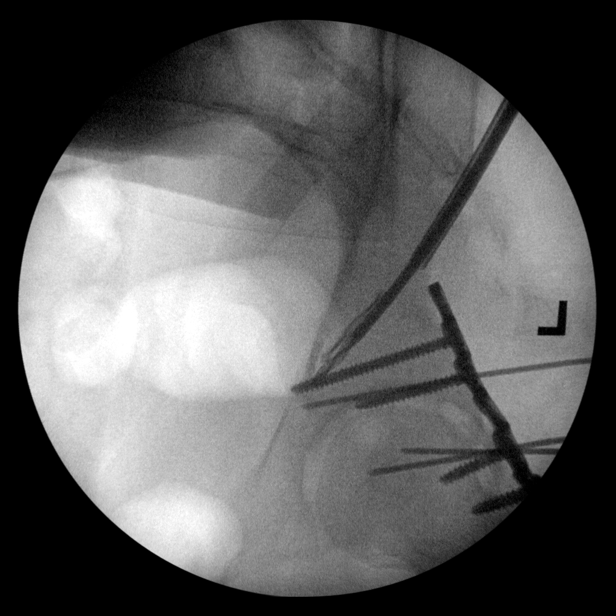
[im 5/12]
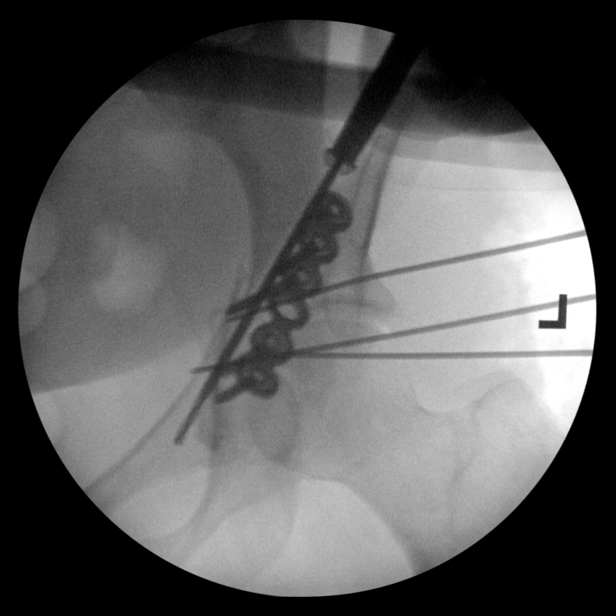
[im 6/12]
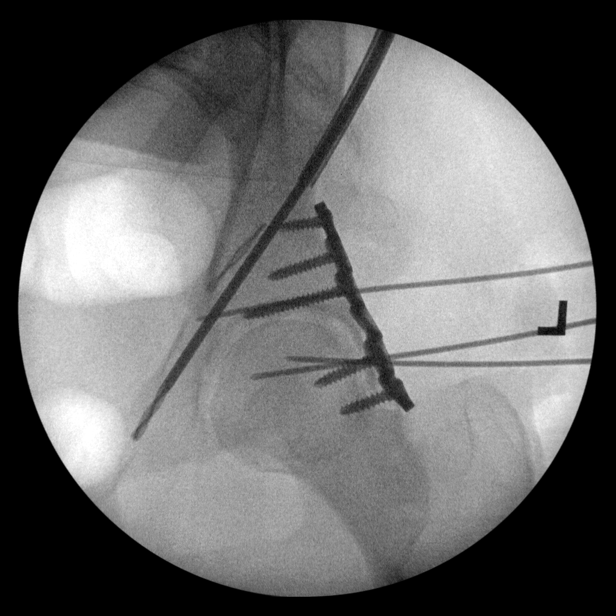
[im 7/12]
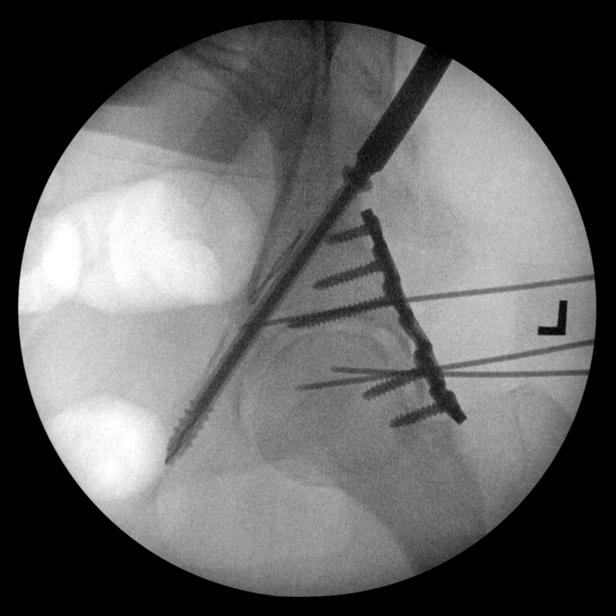
[im 8/12]
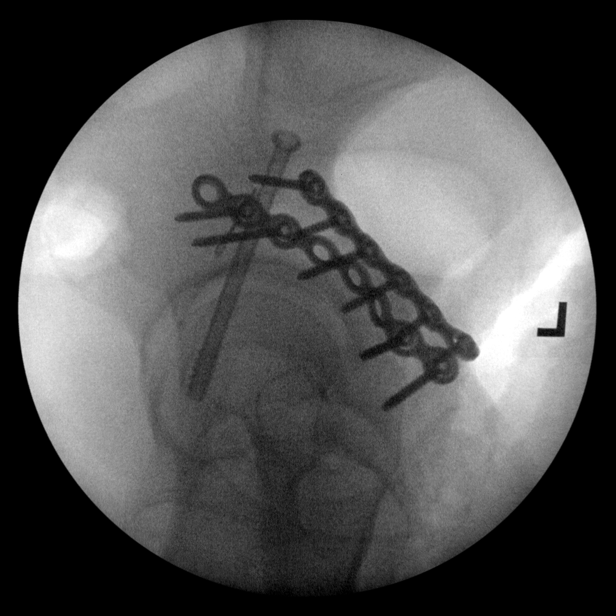
[im 9/12]
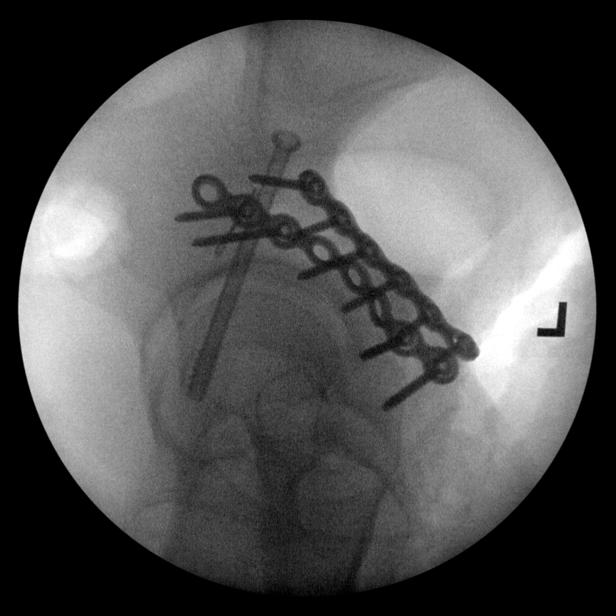
[im 10/12]
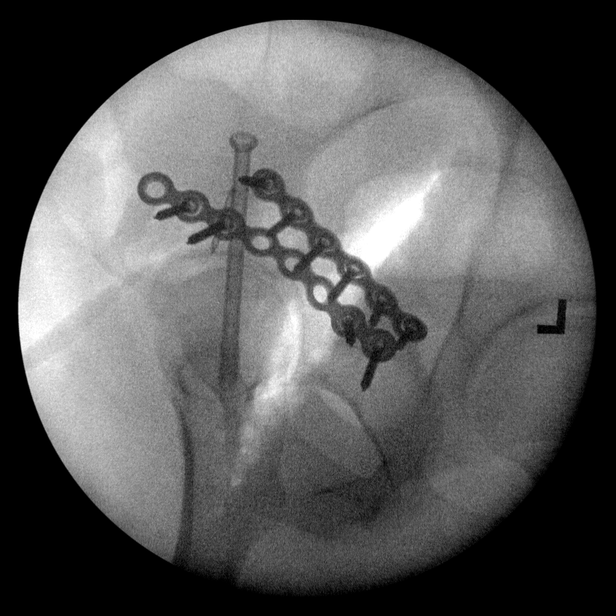
[im 11/12]
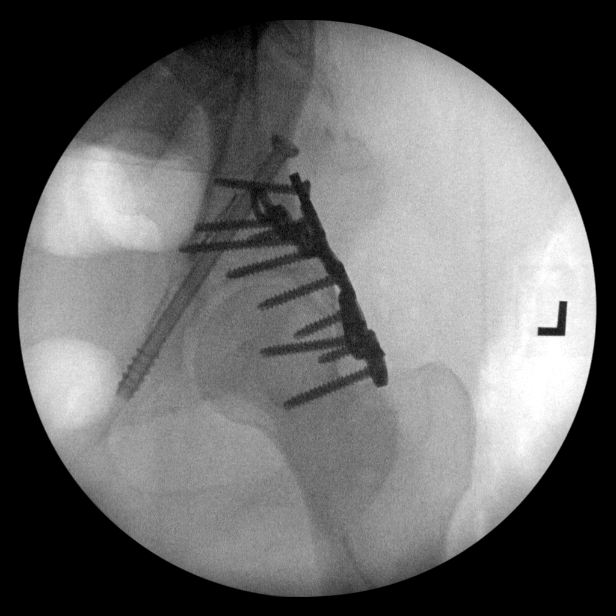
[im 12/12]
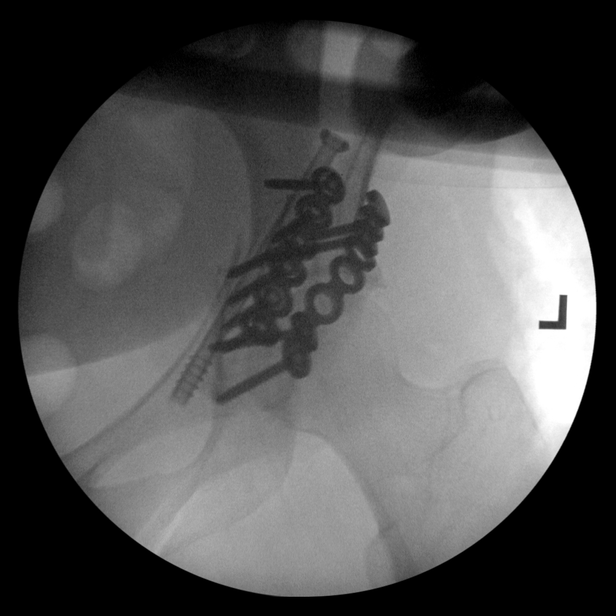

[12 of 12 positions shown; findings below may reference images not displayed]

FINDINGS: Eight fluoroscopic spot views of the left hip obtained in frontal,
lateral, and oblique projections. Plate and screw fixation of
acetabular fracture, with additional interfragmentary screw.
Fluoroscopy time 1 minutes 31 seconds. Dose 75.7 mGy.
IMPRESSION: Procedural fluoroscopy during left acetabular fracture ORIF.

## 2020-11-02 IMAGING — RF DG ANKLE 2V *R*
1 series · 2 of 2 positions shown · non-contrast
Comparison: Radiograph [DATE]

CLINICAL DATA: Stress views of ankle during surgery.

EXAM:
RIGHT ANKLE - 2 VIEW

[Series 1: run · 2 of 2 slices shown]
[im 1/2]
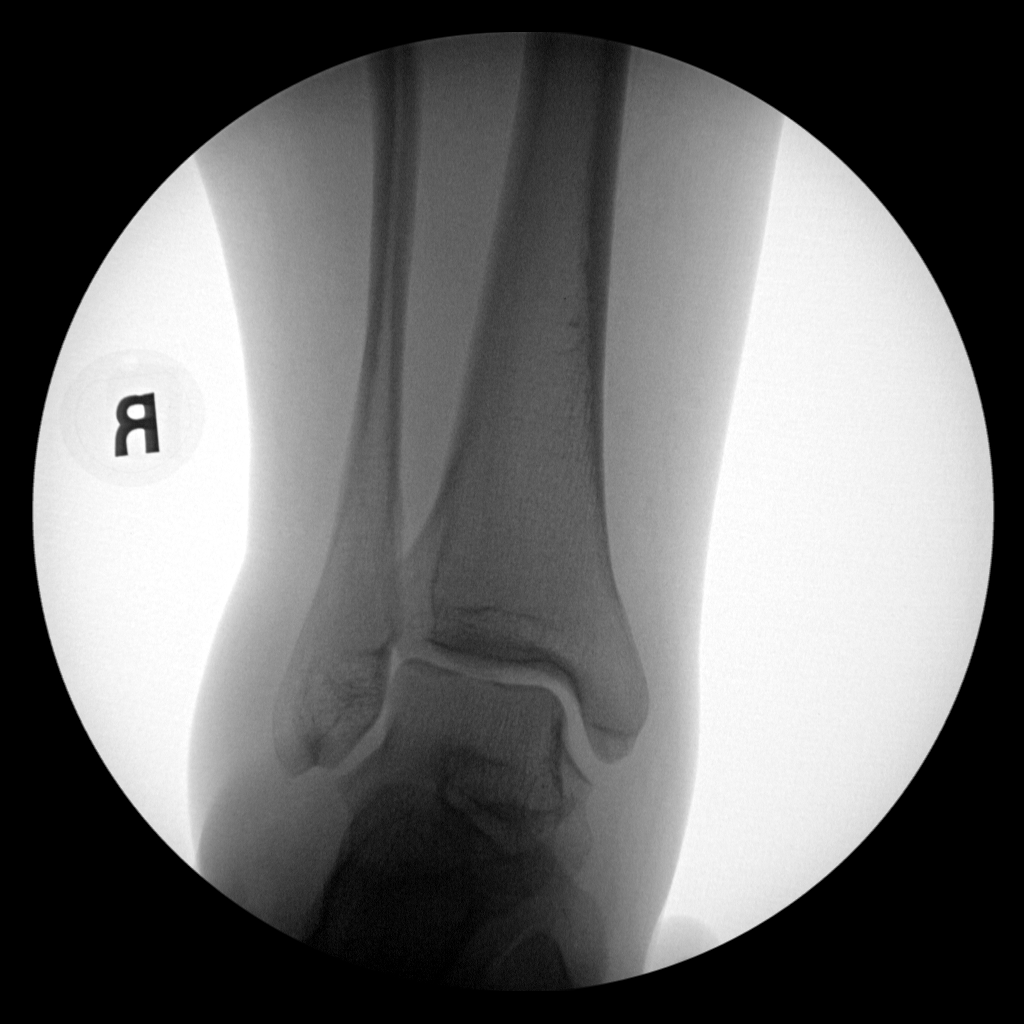
[im 2/2]
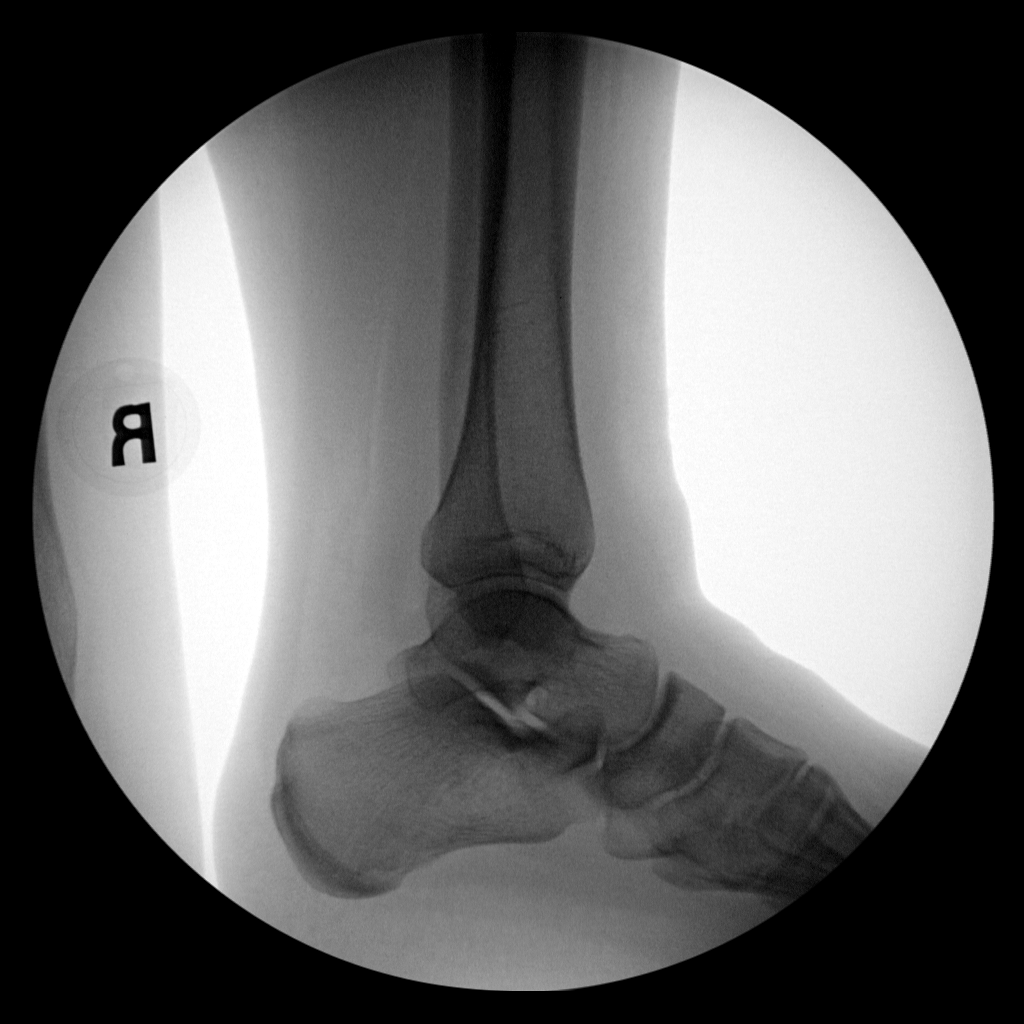

[2 of 2 positions shown; findings below may reference images not displayed]

FINDINGS: Two fluoroscopic spot views obtained of the right ankle in frontal
and lateral projections. Ankle mortise is preserved. No visualized
fracture. Fluoroscopy time 1 minutes 31 seconds.
IMPRESSION: Fluoroscopic spot views of the right ankle.

## 2020-11-02 IMAGING — CT CT HIP*L* W/O CM
3 of 6 series · 15 of 46 positions shown, 19 images · non-contrast
Comparison: Radiographs and CT scan [DATE]

CLINICAL DATA: Evaluate complex left acetabular fracture.

EXAM:
CT OF THE LEFT HIP WITHOUT CONTRAST
TECHNIQUE: Multidetector CT imaging of the left hip was performed according to
the standard protocol. Multiplanar CT image reconstructions were
also generated.

[Series 6: pelvis thin · axial · 0.39mm/px · z∈[+1075,+1285]mm · 13 of 400 slices shown, 17 images]
[im 34/400  soft-tissue]
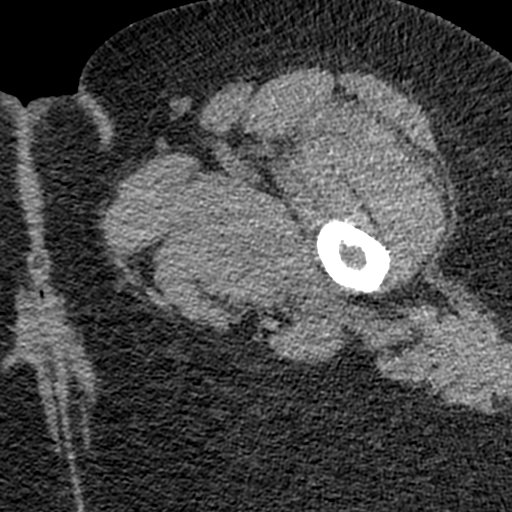
[im 34/400  bone]
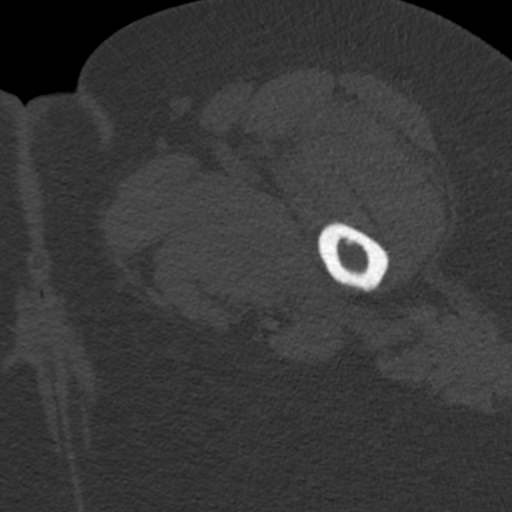
[im 67/400  soft-tissue]
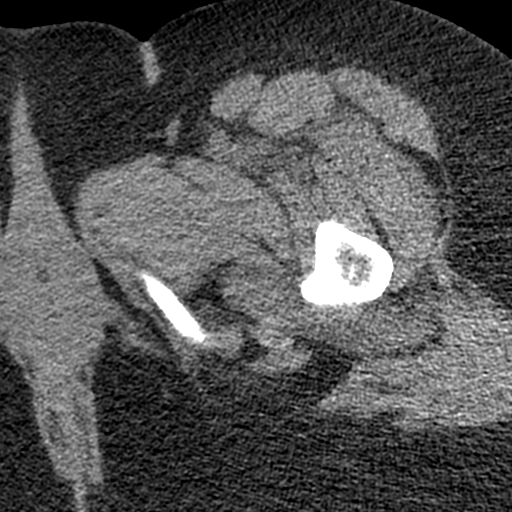
[im 100/400  soft-tissue]
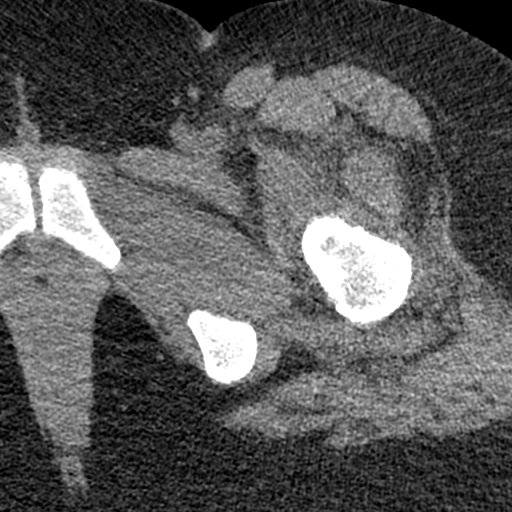
[im 134/400  soft-tissue]
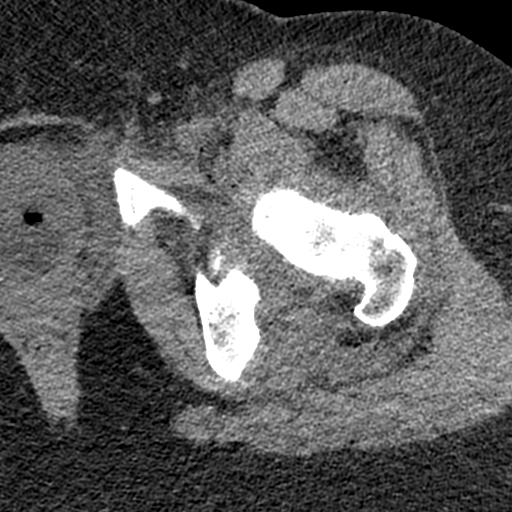
[im 167/400  soft-tissue]
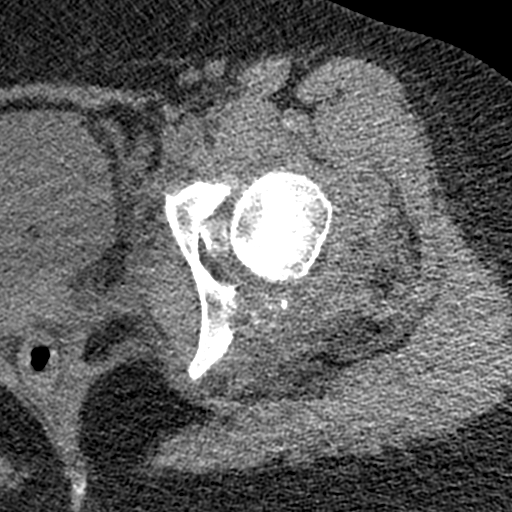
[im 200/400  soft-tissue]
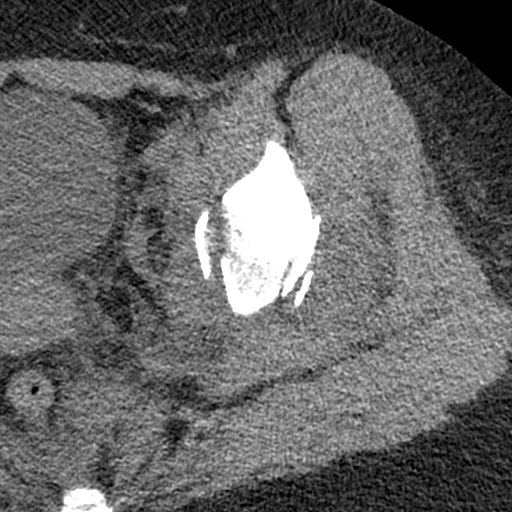
[im 233/400  soft-tissue]
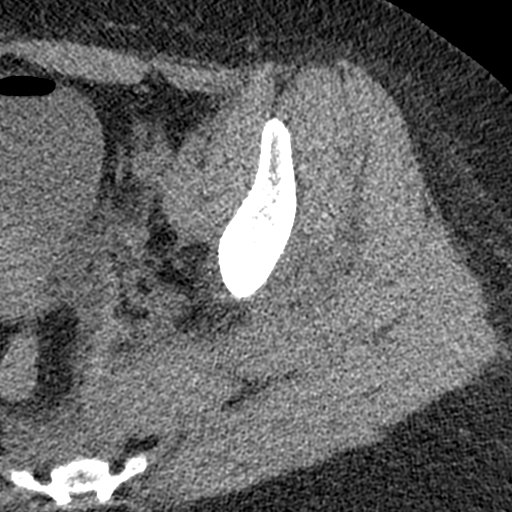
[im 267/400  soft-tissue]
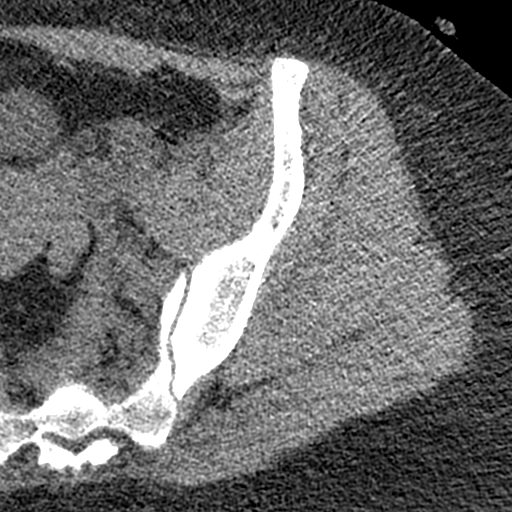
[im 300/400  soft-tissue]
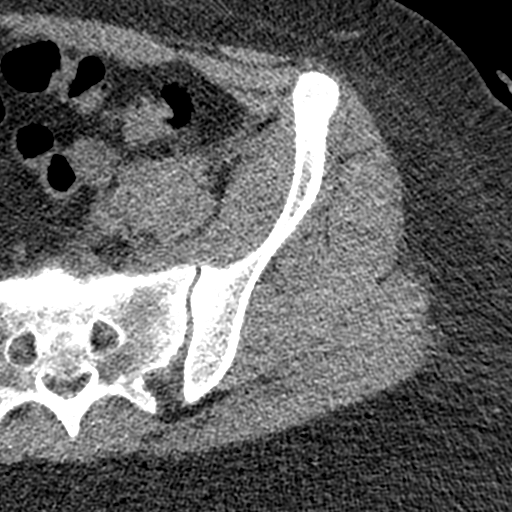
[im 300/400  bone]
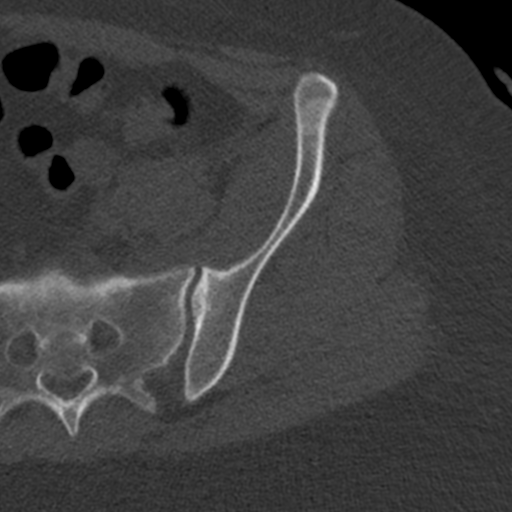
[im 333/400  soft-tissue]
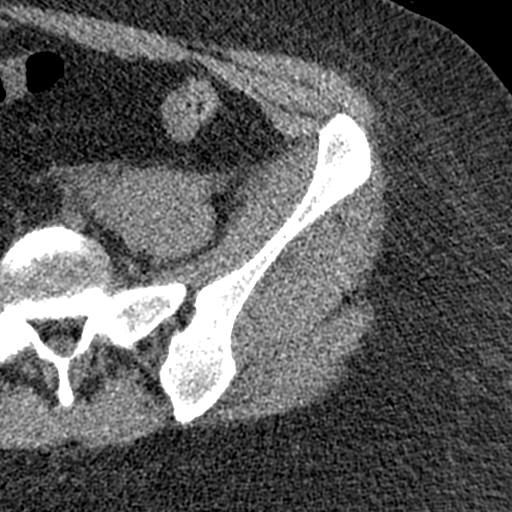
[im 333/400  lung]
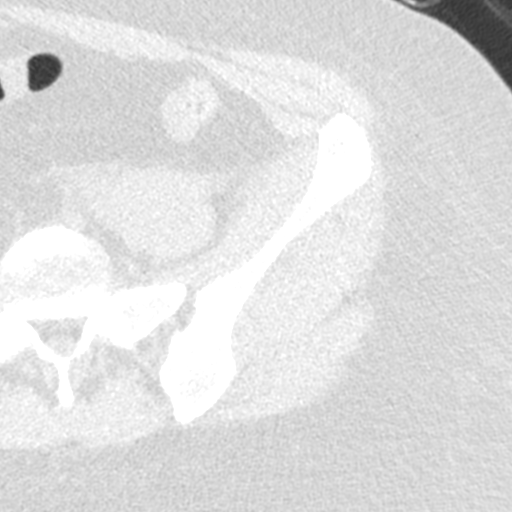
[im 350/400  lung]
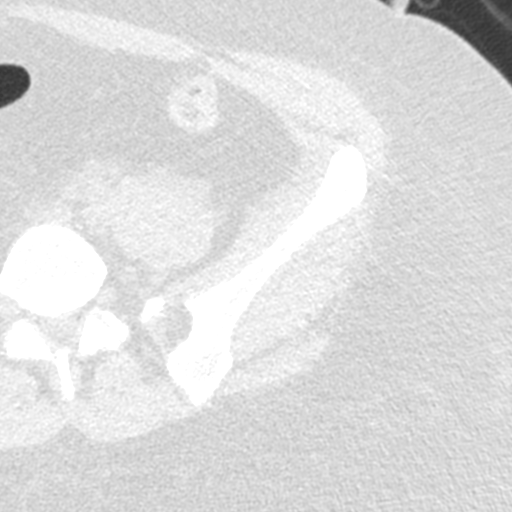
[im 366/400  soft-tissue]
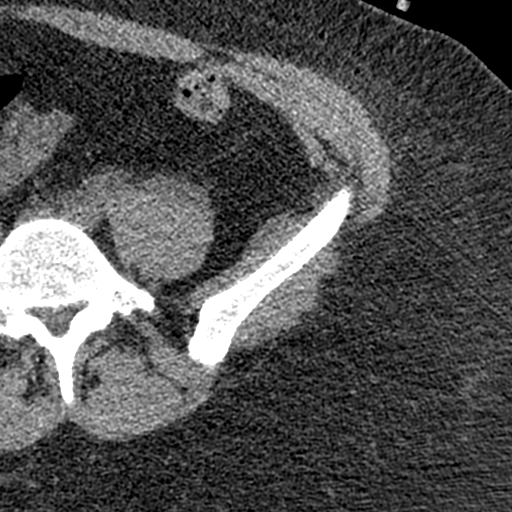
[im 366/400  lung]
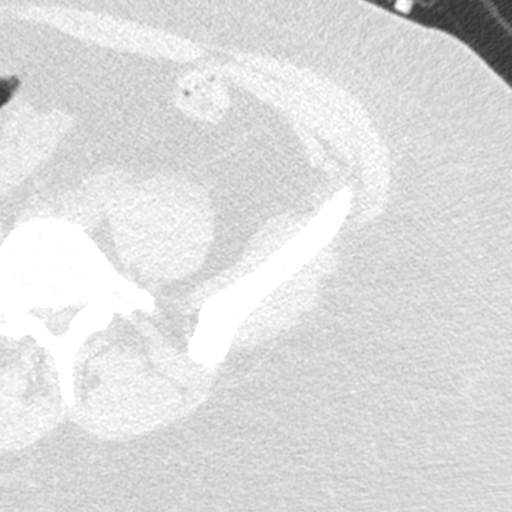
[im 383/400  lung]
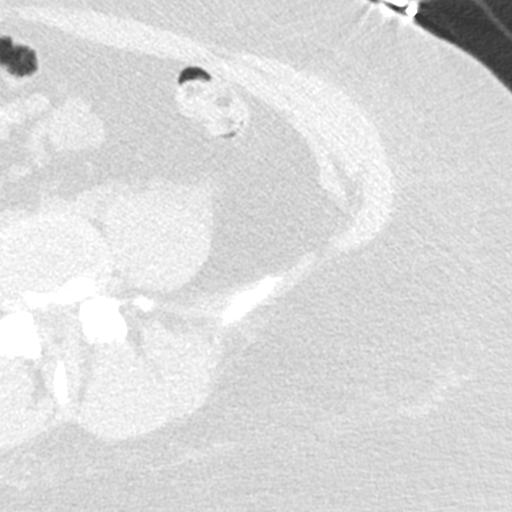

[Series 8: coronal st · coronal · 0.49mm/px · 1 of 95 slices shown]
[im 48/95  soft-tissue]
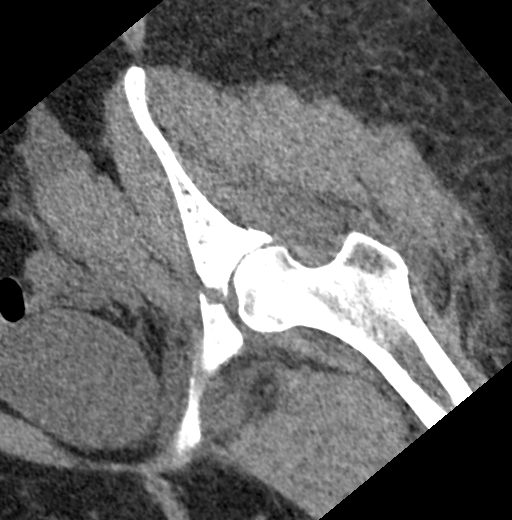

[Series 10: sagittal st · sagittal · 0.51mm/px · 1 of 98 slices shown]
[im 43/98  soft-tissue]
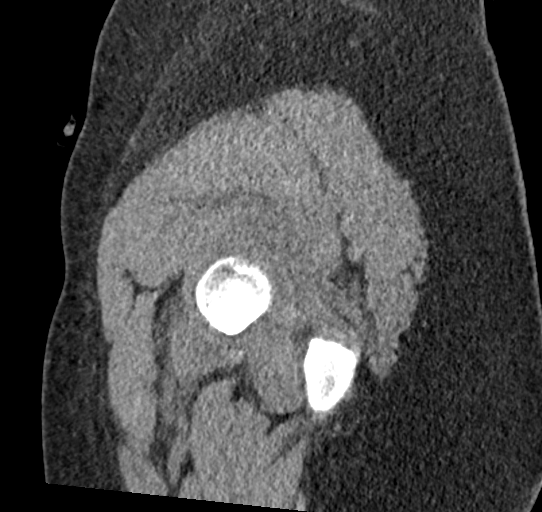

[15 of 46 positions shown; findings below may reference images not displayed]

FINDINGS: Complex comminuted left acetabular fractures. This is largely a
transverse and posterior wall fracture with a large loose fracture
fragment in the joint measuring 23 x 18 mm.

The left hip is intact. No hip fracture. The pubic bones are intact.
There is a small defect along the pubic symphysis but this appears
fairly smooth and is more likely an unfused secondary ossification
center. No widening of the pubic symphysis.

Associated surrounding hip and pelvic hematoma with some
extraperitoneal pelvic hematoma noted. Gas in the bladder likely
from recent catheter placement or instrumentation.
IMPRESSION: 1. Complex comminuted left acetabular fractures as detailed above
with a large loose fracture fragment in the joint.
2. Associated surrounding hip and pelvic hematoma with some
extraperitoneal pelvic hematoma noted.
3. No hip fracture.
4. Small defect along the pubic symphysis appears fairly smooth and
is more likely an unfused secondary ossification center.

## 2020-11-02 IMAGING — RF DG HIP (WITH PELVIS) OPERATIVE*L*
1 series · 12 of 12 positions shown · non-contrast
Comparison: Preoperative CT.

CLINICAL DATA: ORIF left hip fracture.

EXAM:
OPERATIVE LEFT HIP (WITH PELVIS IF PERFORMED)
TECHNIQUE: Fluoroscopic spot image(s) were submitted for interpretation
post-operatively.

[Series 1: run · 12 of 12 slices shown]
[im 1/12]
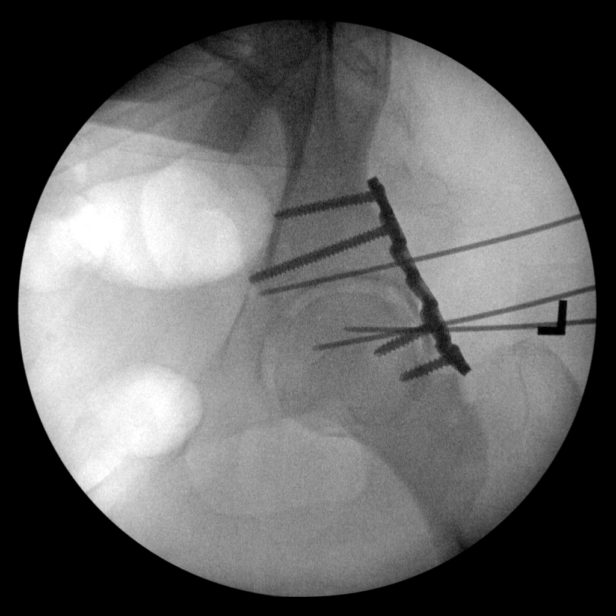
[im 2/12]
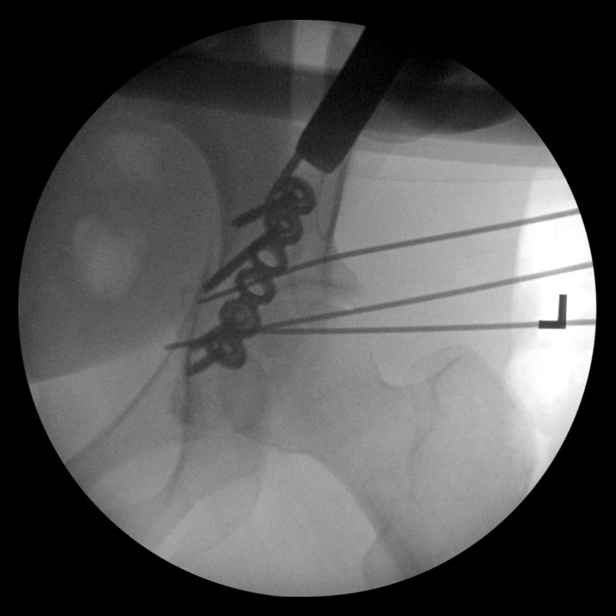
[im 3/12]
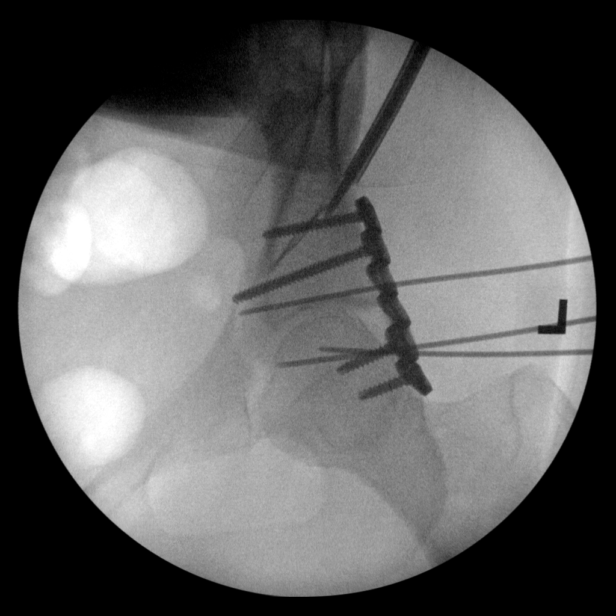
[im 4/12]
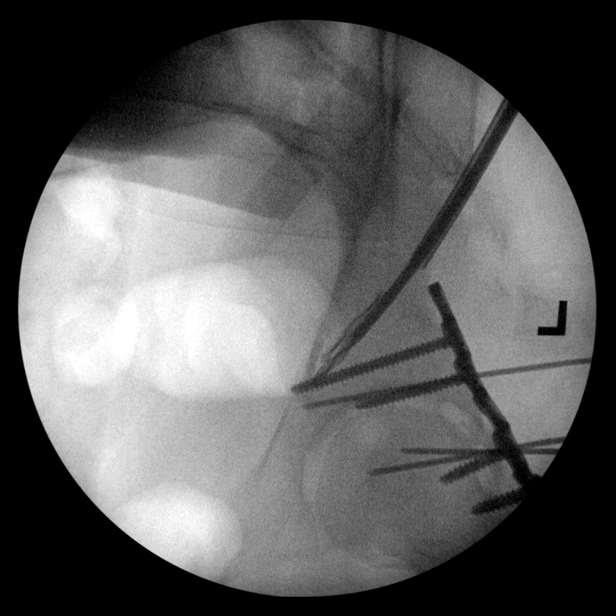
[im 5/12]
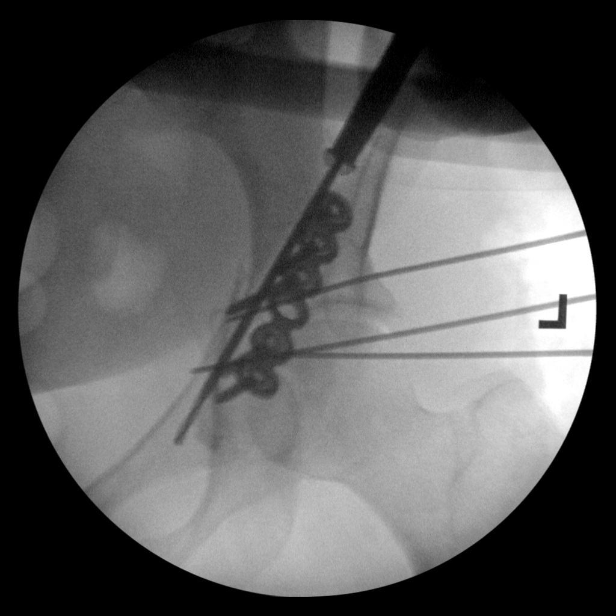
[im 6/12]
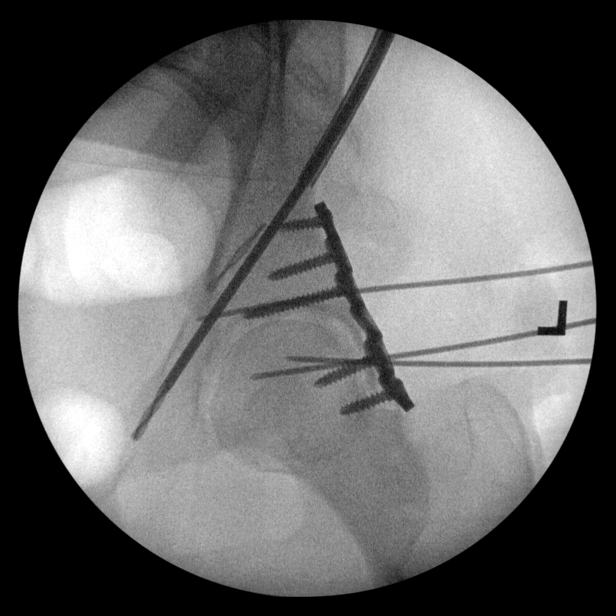
[im 7/12]
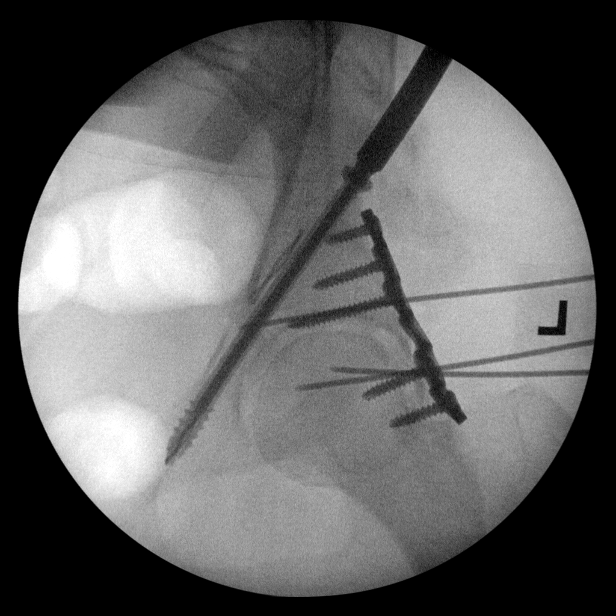
[im 8/12]
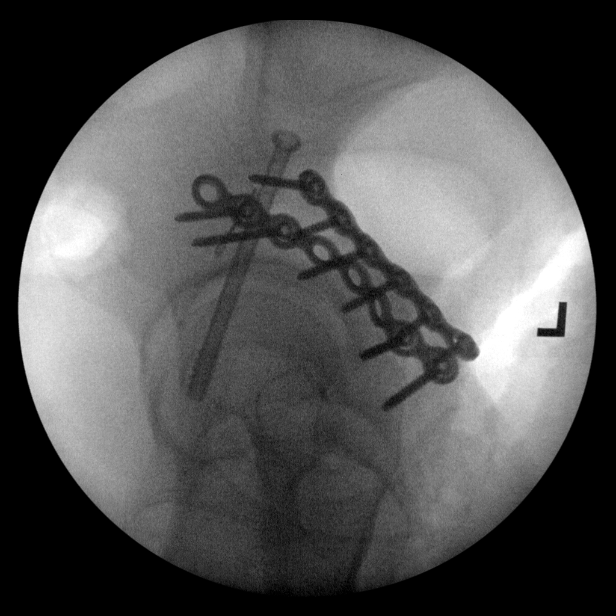
[im 9/12]
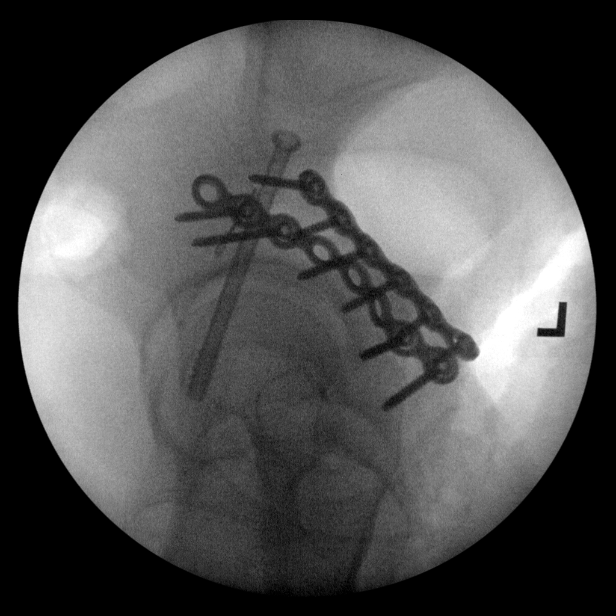
[im 10/12]
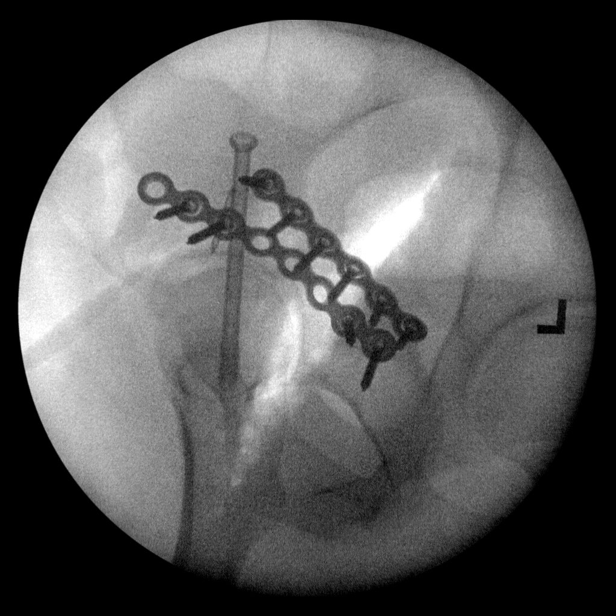
[im 11/12]
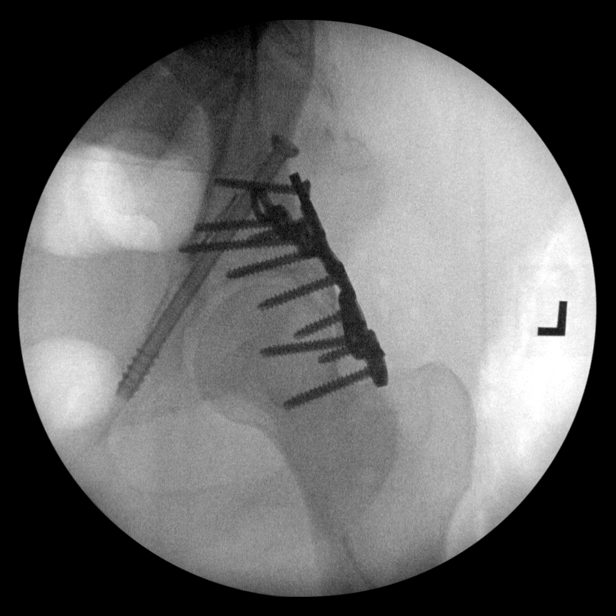
[im 12/12]
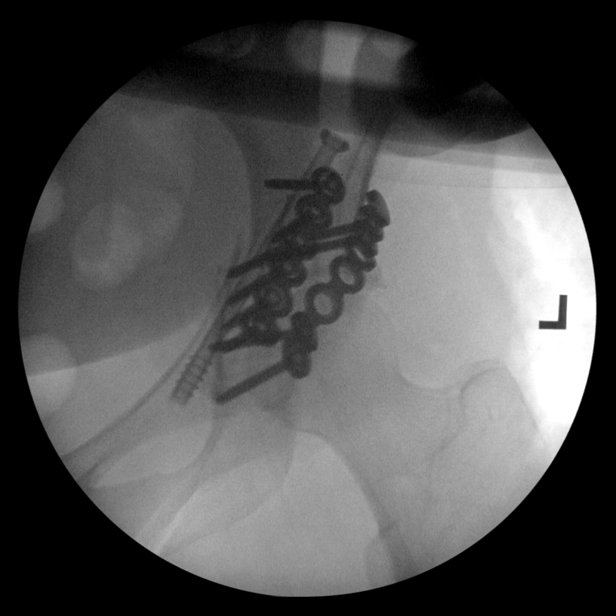

[12 of 12 positions shown; findings below may reference images not displayed]

FINDINGS: Eight fluoroscopic spot views of the left hip obtained in frontal,
lateral, and oblique projections. Plate and screw fixation of
acetabular fracture, with additional interfragmentary screw.
Fluoroscopy time 1 minutes 31 seconds. Dose 75.7 mGy.
IMPRESSION: Procedural fluoroscopy during left acetabular fracture ORIF.

## 2020-11-02 SURGERY — OPEN REDUCTION INTERNAL FIXATION ACETABULUM POSTERIOR LATERAL
Anesthesia: General | Site: Hip | Laterality: Left

## 2020-11-02 MED ORDER — CEFAZOLIN SODIUM-DEXTROSE 2-4 GM/100ML-% IV SOLN
2.0000 g | Freq: Three times a day (TID) | INTRAVENOUS | Status: AC
  Administered 2020-11-02 – 2020-11-03 (×3): 2 g via INTRAVENOUS
  Filled 2020-11-02 (×3): qty 100

## 2020-11-02 MED ORDER — ACETAMINOPHEN 10 MG/ML IV SOLN
INTRAVENOUS | Status: AC
  Filled 2020-11-02: qty 100

## 2020-11-02 MED ORDER — SODIUM CHLORIDE 0.9 % IR SOLN
Status: DC | PRN
  Administered 2020-11-02: 3000 mL

## 2020-11-02 MED ORDER — POTASSIUM CHLORIDE 10 MEQ/100ML IV SOLN
10.0000 meq | INTRAVENOUS | Status: AC
  Administered 2020-11-02 (×3): 10 meq via INTRAVENOUS
  Filled 2020-11-02 (×3): qty 100

## 2020-11-02 MED ORDER — ACETAMINOPHEN 10 MG/ML IV SOLN
INTRAVENOUS | Status: DC | PRN
  Administered 2020-11-02: 1000 mg via INTRAVENOUS

## 2020-11-02 MED ORDER — MIDAZOLAM HCL 2 MG/2ML IJ SOLN
INTRAMUSCULAR | Status: DC | PRN
  Administered 2020-11-02: 4 mg via INTRAVENOUS

## 2020-11-02 MED ORDER — FENTANYL CITRATE (PF) 250 MCG/5ML IJ SOLN
INTRAMUSCULAR | Status: DC | PRN
  Administered 2020-11-02 (×3): 50 ug via INTRAVENOUS
  Administered 2020-11-02: 100 ug via INTRAVENOUS
  Administered 2020-11-02 (×5): 50 ug via INTRAVENOUS

## 2020-11-02 MED ORDER — ROCURONIUM BROMIDE 100 MG/10ML IV SOLN
INTRAVENOUS | Status: DC | PRN
  Administered 2020-11-02 (×2): 100 mg via INTRAVENOUS

## 2020-11-02 MED ORDER — VANCOMYCIN HCL 1000 MG IV SOLR
INTRAVENOUS | Status: AC
  Filled 2020-11-02: qty 1000

## 2020-11-02 MED ORDER — MIDAZOLAM HCL 2 MG/2ML IJ SOLN
INTRAMUSCULAR | Status: AC
  Filled 2020-11-02: qty 4

## 2020-11-02 MED ORDER — POTASSIUM CHLORIDE IN NACL 20-0.9 MEQ/L-% IV SOLN
INTRAVENOUS | Status: DC
  Filled 2020-11-02 (×6): qty 1000

## 2020-11-02 MED ORDER — FENTANYL CITRATE (PF) 250 MCG/5ML IJ SOLN
INTRAMUSCULAR | Status: AC
  Filled 2020-11-02: qty 5

## 2020-11-02 MED ORDER — PHENYLEPHRINE HCL-NACL 10-0.9 MG/250ML-% IV SOLN
INTRAVENOUS | Status: DC | PRN
  Administered 2020-11-02: 50 ug/min via INTRAVENOUS

## 2020-11-02 MED ORDER — VANCOMYCIN HCL 1000 MG IV SOLR
INTRAVENOUS | Status: DC | PRN
  Administered 2020-11-02: 1000 mg

## 2020-11-02 MED ORDER — 0.9 % SODIUM CHLORIDE (POUR BTL) OPTIME
TOPICAL | Status: DC | PRN
  Administered 2020-11-02: 1000 mL

## 2020-11-02 MED ORDER — LACTATED RINGERS IV SOLN: INTRAVENOUS | Status: DC | PRN

## 2020-11-02 MED ORDER — PROPOFOL 10 MG/ML IV BOLUS
INTRAVENOUS | Status: AC
  Filled 2020-11-02: qty 20

## 2020-11-02 MED ORDER — SODIUM CHLORIDE 0.9 % IV SOLN: INTRAVENOUS | Status: DC

## 2020-11-02 MED ORDER — DEXTROSE 5 % IV SOLN
INTRAVENOUS | Status: DC | PRN
  Administered 2020-11-02: 3 g via INTRAVENOUS

## 2020-11-02 MED ORDER — PHENYLEPHRINE 40 MCG/ML (10ML) SYRINGE FOR IV PUSH (FOR BLOOD PRESSURE SUPPORT)
PREFILLED_SYRINGE | INTRAVENOUS | Status: DC | PRN
  Administered 2020-11-02: 120 ug via INTRAVENOUS
  Administered 2020-11-02 (×2): 240 ug via INTRAVENOUS

## 2020-11-02 SURGICAL SUPPLY — 88 items
BIT DRILL AO MATTA 2.5MX230M (BIT) ×1 IMPLANT
BIT DRILL SCALD PELVS II 2.5MM (BIT) ×1 IMPLANT
BIT DRILL STEP 3.5 (DRILL) IMPLANT
BIT DRILL TWST MATTA 3.5MX195M (BIT) ×1 IMPLANT
BLADE CLIPPER SURG (BLADE) IMPLANT
BONE CANC CHIPS 20CC PCAN1/4 (Bone Implant) ×2 IMPLANT
BRUSH SCRUB EZ PLAIN DRY (MISCELLANEOUS) ×4 IMPLANT
CHIPS CANC BONE 20CC PCAN1/4 (Bone Implant) ×1 IMPLANT
COVER SURGICAL LIGHT HANDLE (MISCELLANEOUS) ×2 IMPLANT
COVER WAND RF STERILE (DRAPES) IMPLANT
DRAPE C-ARM 42X72 X-RAY (DRAPES) ×2 IMPLANT
DRAPE C-ARMOR (DRAPES) ×2 IMPLANT
DRAPE INCISE IOBAN 66X45 STRL (DRAPES) ×2 IMPLANT
DRAPE INCISE IOBAN 85X60 (DRAPES) ×2 IMPLANT
DRAPE ORTHO SPLIT 77X108 STRL (DRAPES) ×4
DRAPE SURG ORHT 6 SPLT 77X108 (DRAPES) ×2 IMPLANT
DRAPE U-SHAPE 47X51 STRL (DRAPES) ×2 IMPLANT
DRESSING MEPILEX FLEX 4X4 (GAUZE/BANDAGES/DRESSINGS) ×1 IMPLANT
DRILL BIT AO MATTA 2.5MX230M (BIT) ×2
DRILL SCALED PELVIS II 2.5MM (BIT) ×2
DRILL STEP 3.5 (DRILL)
DRILL TWIST AO MATTA 3.5MX195M (BIT) ×2
DRSG MEPILEX BORDER 4X12 (GAUZE/BANDAGES/DRESSINGS) ×2 IMPLANT
DRSG MEPILEX BORDER 4X4 (GAUZE/BANDAGES/DRESSINGS) ×2 IMPLANT
DRSG MEPILEX BORDER 4X8 (GAUZE/BANDAGES/DRESSINGS) IMPLANT
DRSG MEPILEX FLEX 4X4 (GAUZE/BANDAGES/DRESSINGS) ×2
ELECT BLADE 6.5 EXT (BLADE) ×2 IMPLANT
ELECT REM PT RETURN 9FT ADLT (ELECTROSURGICAL) ×2
ELECTRODE REM PT RTRN 9FT ADLT (ELECTROSURGICAL) ×1 IMPLANT
GLOVE BIO SURGEON STRL SZ7.5 (GLOVE) ×2 IMPLANT
GLOVE BIO SURGEON STRL SZ8 (GLOVE) ×2 IMPLANT
GLOVE BIOGEL PI IND STRL 7.5 (GLOVE) ×1 IMPLANT
GLOVE BIOGEL PI INDICATOR 7.5 (GLOVE) ×1
GLOVE SRG 8 PF TXTR STRL LF DI (GLOVE) ×1 IMPLANT
GLOVE SURG SS PI 7.0 STRL IVOR (GLOVE) ×4 IMPLANT
GLOVE SURG UNDER POLY LF SZ8 (GLOVE) ×2
GOWN STRL REUS W/ TWL LRG LVL3 (GOWN DISPOSABLE) ×3 IMPLANT
GOWN STRL REUS W/ TWL XL LVL3 (GOWN DISPOSABLE) ×2 IMPLANT
GOWN STRL REUS W/TWL LRG LVL3 (GOWN DISPOSABLE) ×6
GOWN STRL REUS W/TWL XL LVL3 (GOWN DISPOSABLE) ×4
HANDPIECE INTERPULSE COAX TIP (DISPOSABLE) ×2
KIT BASIN OR (CUSTOM PROCEDURE TRAY) ×2 IMPLANT
KIT TURNOVER KIT B (KITS) ×2 IMPLANT
MANIFOLD NEPTUNE II (INSTRUMENTS) ×2 IMPLANT
NS IRRIG 1000ML POUR BTL (IV SOLUTION) ×2 IMPLANT
PACK TOTAL JOINT (CUSTOM PROCEDURE TRAY) ×2 IMPLANT
PAD ARMBOARD 7.5X6 YLW CONV (MISCELLANEOUS) ×4 IMPLANT
PIN APEX 180X50X5XST SLF (PIN) ×1 IMPLANT
PIN APEX 5X180 (PIN) ×2
PIN APEX 6X180MM EXFIX (EXFIX) ×2 IMPLANT
PIN GUIDE DRILL TIP 2.8X300 (DRILL) ×2 IMPLANT
PLATE ACET STRT 70.5M 6H (Plate) ×2 IMPLANT
PLATE ACET STRT 94.5M 8H (Plate) ×2 IMPLANT
RETRACTOR ONETRAX LX 135X30 (MISCELLANEOUS) ×2 IMPLANT
RETRIEVER SUT HEWSON (MISCELLANEOUS) ×2 IMPLANT
SCREW 8.0X80MMX16 (Screw) ×2 IMPLANT
SCREW CORTEX ST MATTA 3.5X14 (Screw) ×2 IMPLANT
SCREW CORTEX ST MATTA 3.5X16MM (Screw) ×2 IMPLANT
SCREW CORTEX ST MATTA 3.5X18MM (Screw) ×2 IMPLANT
SCREW CORTEX ST MATTA 3.5X20 (Screw) ×2 IMPLANT
SCREW CORTEX ST MATTA 3.5X24 (Screw) ×2 IMPLANT
SCREW CORTEX ST MATTA 3.5X28MM (Screw) ×8 IMPLANT
SCREW CORTEX ST MATTA 3.5X30MM (Screw) ×2 IMPLANT
SCREW CORTEX ST MATTA 3.5X36MM (Screw) ×2 IMPLANT
SCREW CORTICAL 3.5X42MM (Screw) ×2 IMPLANT
SET HNDPC FAN SPRY TIP SCT (DISPOSABLE) ×1 IMPLANT
SPONGE LAP 18X18 RF (DISPOSABLE) ×2 IMPLANT
STAPLER VISISTAT 35W (STAPLE) ×2 IMPLANT
STRIP CLOSURE SKIN 1/2X4 (GAUZE/BANDAGES/DRESSINGS) IMPLANT
SUCTION FRAZIER HANDLE 10FR (MISCELLANEOUS) ×1
SUCTION TUBE FRAZIER 10FR DISP (MISCELLANEOUS) ×1 IMPLANT
SUT ETHILON 2 0 PSLX (SUTURE) ×6 IMPLANT
SUT FIBERWIRE #2 38 T-5 BLUE (SUTURE) ×4
SUT VIC AB 0 CT1 27 (SUTURE) ×2
SUT VIC AB 0 CT1 27XBRD ANBCTR (SUTURE) ×1 IMPLANT
SUT VIC AB 1 CT1 18XCR BRD 8 (SUTURE) ×1 IMPLANT
SUT VIC AB 1 CT1 27 (SUTURE) ×2
SUT VIC AB 1 CT1 27XBRD ANBCTR (SUTURE) ×1 IMPLANT
SUT VIC AB 1 CT1 8-18 (SUTURE) ×2
SUT VIC AB 2-0 CT1 27 (SUTURE) ×2
SUT VIC AB 2-0 CT1 TAPERPNT 27 (SUTURE) ×1 IMPLANT
SUTURE FIBERWR #2 38 T-5 BLUE (SUTURE) ×2 IMPLANT
TOWEL GREEN STERILE (TOWEL DISPOSABLE) ×4 IMPLANT
TOWEL GREEN STERILE FF (TOWEL DISPOSABLE) ×4 IMPLANT
TRAY FOLEY MTR SLVR 16FR STAT (SET/KITS/TRAYS/PACK) IMPLANT
TUBE CONNECTING 12X1/4 (SUCTIONS) ×6 IMPLANT
WATER STERILE IRR 1000ML POUR (IV SOLUTION) IMPLANT
YANKAUER SUCT BULB TIP NO VENT (SUCTIONS) ×2 IMPLANT

## 2020-11-02 NOTE — Progress Notes (Signed)
Given the location and complexity of the acetabular fracture, Dr. Charlann Boxer asserted this was outside his scope of practice and that it would be in the best interest of the patient to have these injuries evaluated and treated by a fellowship trained orthopaedic traumatologist. Consequently, I was consulted to provide further evaluation and management.  I have seen and examined the patient at the direction of Dr. Charlann Boxer and the trauma service. I have personally reviewed and discussed in detail with [name], PA-C, the patient's presentation, progress, and confirmed the examination findings; I have also reviewed and interpreted the x-rays and laboratory studies; and I formulated the plan for treatment which is outlined above, in addition to communicating with the primary service.  The risks and benefits of surgical repair of the left acetabulum include the possibility of infection, nerve injury, vessel injury, wound breakdown, arthritis, symptomatic hardware, DVT/ PE, loss of motion, malunion, nonunion, heterotoptic ossification, avascular necrosis, and need for further surgery among others.  These risks were acknowledged and consent provided to proceed.  Myrene Galas, MD [facility], [facility] [phone]
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
## 2020-11-02 NOTE — Progress Notes (Signed)
Orthopedic Tech Progress Note Patient Details:  Madison Cook 02-08-1996 [id number] OR RN called requesting a CAM WALKER BOOT. Dropped off at desk Ortho Devices Type of Ortho Device: CAM walker Ortho Device/Splint Location: RLE Ortho Device/Splint Interventions: Other (comment)   Post Interventions Patient Tolerated: Other (comment) Instructions Provided: Other (comment)   [name] 11/02/2020, 3:45 PM

## 2020-11-02 NOTE — Progress Notes (Signed)
Per RN, PICC to be delayed until return from OR.
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
## 2020-11-02 NOTE — Transfer of Care (Signed)
Immediate Anesthesia Transfer of Care Note  Patient: Madison Cook  Procedure(s) Performed: OPEN REDUCTION INTERNAL FIXATION ACETABULUM POSTERIOR LATERAL (Left Hip)  Patient Location: ICU  Anesthesia Type:General  Level of Consciousness: sedated and Patient remains intubated per anesthesia plan  Airway & Oxygen Therapy: Patient remains intubated per anesthesia plan and Patient placed on Ventilator (see vital sign flow sheet for setting)  Post-op Assessment: Report given to RN and Post -op Vital signs reviewed and stable  Post vital signs: Reviewed and stable  Last Vitals:  Vitals Value Taken Time  BP 111/78 11/02/20 1622  Temp    Pulse 61 11/02/20 1630  Resp 20 11/02/20 1630  SpO2 100 % 11/02/20 1630  Vitals shown include unvalidated device data.  Last Pain:  Vitals:   11/01/20 1600  TempSrc: Bladder  PainSc:          Complications: No complications documented.

## 2020-11-02 NOTE — Progress Notes (Signed)
Assisted tele visit to patient with family member.  Janai Brannigan McEachran, RN  
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
## 2020-11-02 NOTE — Progress Notes (Signed)
Attempted right upper arm PICC insertion, able to insert PICC without difficulty, unable to drop PICC in SVC. After multiple tries and techniques, tip continues to curl and will not drop into SVC. Procedure aborted. RN aware.
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
## 2020-11-02 NOTE — Progress Notes (Signed)
Orthopedic Tech Progress Note Patient Details:  Madison Cook 1996/04/29 [id number] Reapplied TRACTION to patient when patient came back from CT. Patient ID: Madison Cook, female   DOB: 06-16-1996, 25 y.o.   MRN: [id number]   [name] 11/02/2020, 9:18 AM

## 2020-11-02 NOTE — Anesthesia Postprocedure Evaluation (Signed)
Anesthesia Post Note  Patient: Madison Cook  Procedure(s) Performed: OPEN REDUCTION INTERNAL FIXATION ACETABULUM POSTERIOR LATERAL (Left Hip)     Patient location during evaluation: SICU Anesthesia Type: General Level of consciousness: sedated Pain management: pain level controlled Vital Signs Assessment: post-procedure vital signs reviewed and stable Respiratory status: patient remains intubated per anesthesia plan Cardiovascular status: stable Postop Assessment: no apparent nausea or vomiting Anesthetic complications: no   No complications documented.  Last Vitals:  Vitals:   11/02/20 1956 11/02/20 2000  BP:  138/89  Pulse:  82  Resp:  20  Temp:    SpO2: 100% 100%    Last Pain:  Vitals:   11/01/20 1600  TempSrc: Bladder  PainSc:                  Nelle Don Mikalyn Hermida
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
## 2020-11-02 NOTE — Progress Notes (Signed)
Peripherally Inserted Central Catheter Placement  The IV Nurse has discussed with the patient and/or persons authorized to consent for the patient, the purpose of this procedure and the potential benefits and risks involved with this procedure.  The benefits include less needle sticks, lab draws from the catheter, and the patient may be discharged home with the catheter. Risks include, but not limited to, infection, bleeding, blood clot (thrombus formation), and puncture of an artery; nerve damage and irregular heartbeat and possibility to perform a PICC exchange if needed/ordered by physician.  Alternatives to this procedure were also discussed.  Bard Power PICC patient education guide, fact sheet on infection prevention and patient information card has been provided to patient /or left at bedside.    PICC Placement Documentation  PICC Triple Lumen 11/02/20 PICC Right Basilic 41 cm (Active)    Telephone consent   Stacie Glaze Horton 11/02/2020, 6:45 PM
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
## 2020-11-03 ENCOUNTER — Encounter (HOSPITAL_COMMUNITY): Payer: Self-pay

## 2020-11-03 ENCOUNTER — Inpatient Hospital Stay (HOSPITAL_COMMUNITY): Payer: Commercial Managed Care - PPO

## 2020-11-03 DIAGNOSIS — M25361 Other instability, right knee: Secondary | ICD-10-CM | POA: Diagnosis present

## 2020-11-03 DIAGNOSIS — S73005A Unspecified dislocation of left hip, initial encounter: Secondary | ICD-10-CM | POA: Diagnosis present

## 2020-11-03 DIAGNOSIS — S9304XA Dislocation of right ankle joint, initial encounter: Secondary | ICD-10-CM

## 2020-11-03 DIAGNOSIS — S32452A Displaced transverse fracture of left acetabulum, initial encounter for closed fracture: Secondary | ICD-10-CM

## 2020-11-03 DIAGNOSIS — E559 Vitamin D deficiency, unspecified: Secondary | ICD-10-CM

## 2020-11-03 DIAGNOSIS — S82401A Unspecified fracture of shaft of right fibula, initial encounter for closed fracture: Secondary | ICD-10-CM

## 2020-11-03 HISTORY — DX: Other instability, right knee: M25.361

## 2020-11-03 HISTORY — DX: Dislocation of right ankle joint, initial encounter: S93.04XA

## 2020-11-03 HISTORY — DX: Unspecified fracture of shaft of right fibula, initial encounter for closed fracture: S82.401A

## 2020-11-03 HISTORY — DX: Unspecified dislocation of left hip, initial encounter: S73.005A

## 2020-11-03 HISTORY — DX: Displaced transverse fracture of left acetabulum, initial encounter for closed fracture: S32.452A

## 2020-11-03 HISTORY — DX: Vitamin D deficiency, unspecified: E55.9

## 2020-11-03 LAB — VITAMIN D 25 HYDROXY (VIT D DEFICIENCY, FRACTURES): Vit D, 25-Hydroxy: 12.24 ng/mL — ABNORMAL LOW (ref 30–100)

## 2020-11-03 LAB — BASIC METABOLIC PANEL
Anion gap: 10 (ref 5–15)
BUN: <5 mg/dL — ABNORMAL LOW (ref 6–20)
CO2: 17 mmol/L — ABNORMAL LOW (ref 22–32)
Calcium: 7.1 mg/dL — ABNORMAL LOW (ref 8.9–10.3)
Chloride: 109 mmol/L (ref 98–111)
Creatinine, Ser: 0.71 mg/dL (ref 0.44–1.00)
GFR, Estimated: >60 mL/min (ref >60)
Glucose, Bld: 101 mg/dL — ABNORMAL HIGH (ref 70–99)
Potassium: 5 mmol/L (ref 3.5–5.1)
Sodium: 136 mmol/L (ref 135–145)

## 2020-11-03 LAB — PREPARE RBC (CROSSMATCH)

## 2020-11-03 LAB — GLUCOSE, CAPILLARY: Glucose-Capillary: 155 mg/dL — ABNORMAL HIGH (ref 70–99)

## 2020-11-03 LAB — CBC
HCT: 23.9 % — ABNORMAL LOW (ref 36.0–46.0)
Hemoglobin: 7.2 g/dL — ABNORMAL LOW (ref 12.0–15.0)
MCH: 27.7 pg (ref 26.0–34.0)
MCHC: 30.1 g/dL (ref 30.0–36.0)
MCV: 91.9 fL (ref 80.0–100.0)
Platelets: 172 10*3/uL (ref 150–400)
RBC: 2.6 MIL/uL — ABNORMAL LOW (ref 3.87–5.11)
RDW: 14.1 % (ref 11.5–15.5)
WBC: 12.4 10*3/uL — ABNORMAL HIGH (ref 4.0–10.5)
nRBC: 0 % (ref 0.0–0.2)
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
## 2020-11-03 IMAGING — DX DG ABD PORTABLE 1V
1 series · 1 of 1 positions shown · non-contrast
Comparison: None.

CLINICAL DATA: Orogastric tube placement.

EXAM:
PORTABLE ABDOMEN - 1 VIEW

[abdomen supine]
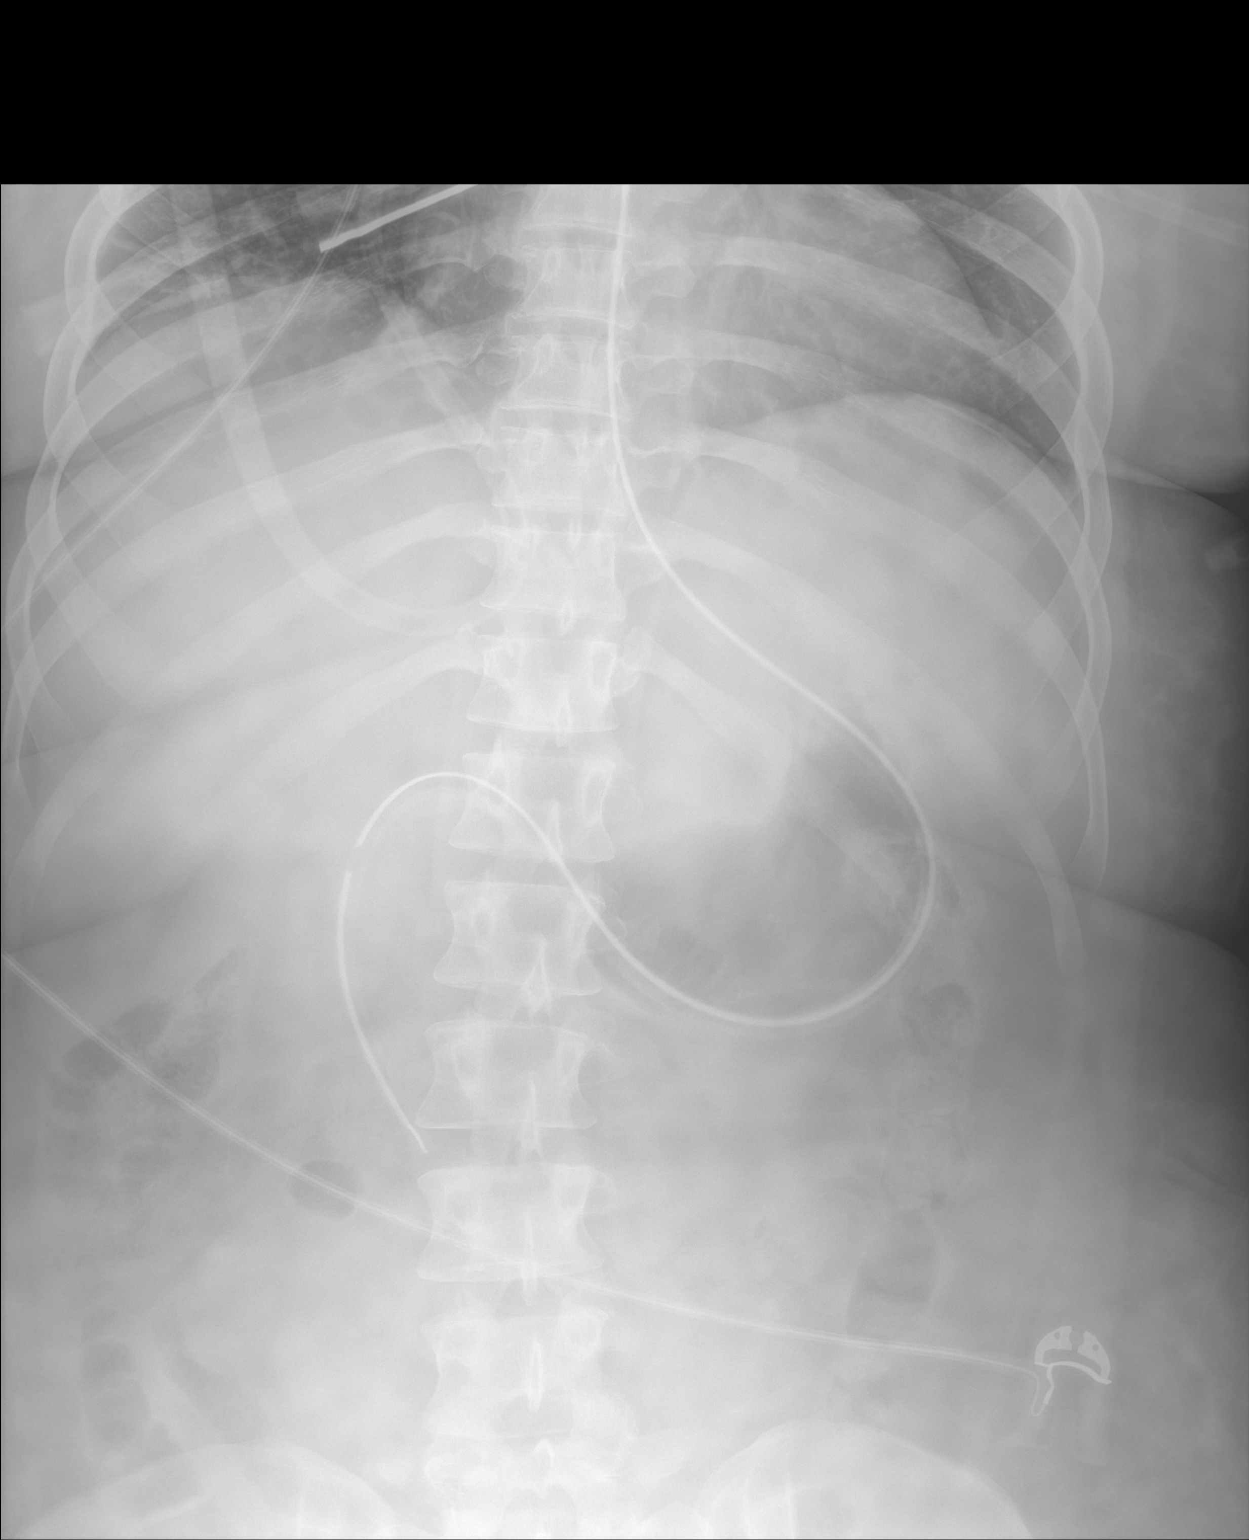

[1 of 1 positions shown; findings below may reference images not displayed]

FINDINGS: The bowel gas pattern is normal. Distal tip of nasogastric tube is
seen in the second portion of the duodenum. No radio-opaque calculi
or other significant radiographic abnormality are seen.
IMPRESSION: Distal tip of nasogastric tube is seen in the second portion of the
duodenum.

## 2020-11-03 MED ORDER — PANTOPRAZOLE SODIUM 40 MG PO PACK
40.0000 mg | PACK | Freq: Every day | ORAL | Status: DC
  Administered 2020-11-03 – 2020-11-04 (×2): 40 mg
  Filled 2020-11-03 (×2): qty 20

## 2020-11-03 MED ORDER — SODIUM CHLORIDE 0.9% FLUSH
10.0000 mL | Freq: Two times a day (BID) | INTRAVENOUS | Status: DC
  Administered 2020-11-03 – 2020-11-10 (×15): 10 mL

## 2020-11-03 MED ORDER — OXYCODONE HCL 5 MG/5ML PO SOLN
5.0000 mg | ORAL | Status: DC | PRN
  Administered 2020-11-03 – 2020-11-04 (×3): 10 mg
  Filled 2020-11-03 (×3): qty 10

## 2020-11-03 MED ORDER — ACETAMINOPHEN 160 MG/5ML PO SOLN
1000.0000 mg | Freq: Four times a day (QID) | ORAL | Status: DC
  Administered 2020-11-03 (×3): 1000 mg
  Filled 2020-11-03 (×4): qty 40.6

## 2020-11-03 MED ORDER — METHOCARBAMOL 500 MG PO TABS
1000.0000 mg | ORAL_TABLET | Freq: Three times a day (TID) | ORAL | Status: DC
  Administered 2020-11-03 – 2020-11-04 (×3): 1000 mg
  Filled 2020-11-03 (×3): qty 2

## 2020-11-03 MED ORDER — SODIUM CHLORIDE 0.9% IV SOLUTION
Freq: Once | INTRAVENOUS | Status: AC
  Administered 2020-11-03: 250 mL via INTRAVENOUS

## 2020-11-03 MED ORDER — METHOCARBAMOL 500 MG PO TABS
1000.0000 mg | ORAL_TABLET | Freq: Three times a day (TID) | ORAL | Status: DC
  Filled 2020-11-03: qty 2

## 2020-11-03 MED ORDER — PIVOT 1.5 CAL PO LIQD
1000.0000 mL | ORAL | Status: DC
  Administered 2020-11-03: 1000 mL

## 2020-11-03 MED ORDER — DEXAMETHASONE SODIUM PHOSPHATE 10 MG/ML IJ SOLN
8.0000 mg | Freq: Four times a day (QID) | INTRAMUSCULAR | Status: AC
  Administered 2020-11-03 – 2020-11-04 (×4): 8 mg via INTRAVENOUS
  Filled 2020-11-03 (×4): qty 1

## 2020-11-03 MED ORDER — DEXMEDETOMIDINE HCL IN NACL 400 MCG/100ML IV SOLN
0.4000 ug/kg/h | INTRAVENOUS | Status: DC
  Administered 2020-11-03: 1.2 ug/kg/h via INTRAVENOUS
  Administered 2020-11-03: 0.8 ug/kg/h via INTRAVENOUS
  Administered 2020-11-03: 0.4 ug/kg/h via INTRAVENOUS
  Administered 2020-11-03: 0.8 ug/kg/h via INTRAVENOUS
  Administered 2020-11-04: 1.2 ug/kg/h via INTRAVENOUS
  Administered 2020-11-04: 1.1 ug/kg/h via INTRAVENOUS
  Filled 2020-11-03 (×7): qty 100

## 2020-11-03 MED ORDER — PROSOURCE TF PO LIQD
90.0000 mL | Freq: Two times a day (BID) | ORAL | Status: DC
  Administered 2020-11-03 – 2020-11-04 (×2): 90 mL
  Filled 2020-11-03 (×2): qty 90

## 2020-11-03 MED ORDER — GUAIFENESIN 100 MG/5ML PO SOLN
10.0000 mL | Freq: Four times a day (QID) | ORAL | Status: DC
  Administered 2020-11-03 – 2020-11-04 (×4): 200 mg
  Filled 2020-11-03 (×3): qty 15

## 2020-11-03 MED ORDER — VITAL HIGH PROTEIN PO LIQD: 1000.0000 mL | ORAL | Status: DC

## 2020-11-03 MED ORDER — MIDAZOLAM HCL 2 MG/2ML IJ SOLN
2.0000 mg | INTRAMUSCULAR | Status: DC | PRN
  Administered 2020-11-03 – 2020-11-04 (×3): 2 mg via INTRAVENOUS
  Filled 2020-11-03 (×3): qty 2

## 2020-11-03 MED ORDER — FENTANYL CITRATE (PF) 100 MCG/2ML IJ SOLN
50.0000 ug | INTRAMUSCULAR | Status: DC | PRN
  Administered 2020-11-03 – 2020-11-04 (×4): 100 ug via INTRAVENOUS
  Filled 2020-11-03 (×3): qty 2

## 2020-11-03 MED ORDER — SODIUM CHLORIDE 0.9 % IV BOLUS
1000.0000 mL | Freq: Once | INTRAVENOUS | Status: AC
  Administered 2020-11-03: 1000 mL via INTRAVENOUS

## 2020-11-03 MED ORDER — PROSOURCE TF PO LIQD
45.0000 mL | Freq: Two times a day (BID) | ORAL | Status: DC
  Administered 2020-11-03: 45 mL
  Filled 2020-11-03: qty 45

## 2020-11-03 MED ORDER — SODIUM CHLORIDE 0.9% FLUSH: 10.0000 mL | INTRAVENOUS | Status: DC | PRN
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
## 2020-11-03 NOTE — Progress Notes (Signed)
RT attempted aline X2 without success. RN made aware. Will make MD aware.

## 2020-11-03 NOTE — Progress Notes (Signed)
Assisted tele visit to patient with family member.  Jakya Dovidio McEachran, RN  
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
## 2020-11-03 NOTE — Anesthesia Postprocedure Evaluation (Signed)
Anesthesia Post Note  Patient: ZAWADI APLIN  Procedure(s) Performed: CLOSED REDUCTION LEFT HIP AND INSERTION OF TRACTION PIN (Left Leg Upper) IRRIGATION AND DEBRIDEMENT WOUND (Right Leg Lower)     Patient location during evaluation: ICU Anesthesia Type: General Level of consciousness: patient remains intubated per anesthesia plan Pain management: pain level controlled Vital Signs Assessment: post-procedure vital signs reviewed and stable Respiratory status: patient on ventilator - see flowsheet for VS and patient remains intubated per anesthesia plan Cardiovascular status: blood pressure returned to baseline and stable Postop Assessment: no apparent nausea or vomiting Anesthetic complications: no Comments: Pt remains intubated and sedated, stable for return to OR today   No complications documented.                Germaine Pomfret

## 2020-11-03 NOTE — Progress Notes (Signed)
RT attempted to extubate pt, however, pt did not have a cuff leak. RN notified Dr. Bedelia Person. RT will attempt again, possibly today.

## 2020-11-03 NOTE — Progress Notes (Signed)
RT accidentally clicked off "extubation order". Pt is not extubated. Pt still on ventilator.
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
## 2020-11-03 NOTE — Addendum Note (Signed)
Addendum  created 11/03/20 1142 by Jairo Ben, MD   Attestation recorded in Intraprocedure, Intraprocedure Attestations filed, Intraprocedure Event edited

## 2020-11-03 NOTE — ED Notes (Signed)
Dr. Bedelia Person at bedside,

## 2020-11-03 NOTE — Progress Notes (Signed)
Patient transported to CT scan and back to 4N25 without incidence.

## 2020-11-04 ENCOUNTER — Inpatient Hospital Stay (HOSPITAL_COMMUNITY): Payer: Commercial Managed Care - PPO

## 2020-11-04 LAB — CBC
HCT: 21.2 % — ABNORMAL LOW (ref 36.0–46.0)
Hemoglobin: 6.9 g/dL — CL (ref 12.0–15.0)
MCH: 28.8 pg (ref 26.0–34.0)
MCHC: 32.5 g/dL (ref 30.0–36.0)
MCV: 88.3 fL (ref 80.0–100.0)
Platelets: 197 10*3/uL (ref 150–400)
RBC: 2.4 MIL/uL — ABNORMAL LOW (ref 3.87–5.11)
RDW: 14.3 % (ref 11.5–15.5)
WBC: 13.4 10*3/uL — ABNORMAL HIGH (ref 4.0–10.5)
nRBC: 0 % (ref 0.0–0.2)

## 2020-11-04 LAB — HEMOGLOBIN AND HEMATOCRIT, BLOOD
HCT: 28.4 % — ABNORMAL LOW (ref 36.0–46.0)
Hemoglobin: 9.5 g/dL — ABNORMAL LOW (ref 12.0–15.0)

## 2020-11-04 LAB — BASIC METABOLIC PANEL
Anion gap: 7 (ref 5–15)
BUN: 6 mg/dL (ref 6–20)
CO2: 18 mmol/L — ABNORMAL LOW (ref 22–32)
Calcium: 6 mg/dL — CL (ref 8.9–10.3)
Chloride: 117 mmol/L — ABNORMAL HIGH (ref 98–111)
Creatinine, Ser: 0.5 mg/dL (ref 0.44–1.00)
GFR, Estimated: >60 mL/min (ref >60)
Glucose, Bld: 139 mg/dL — ABNORMAL HIGH (ref 70–99)
Potassium: 5.8 mmol/L — ABNORMAL HIGH (ref 3.5–5.1)
Sodium: 142 mmol/L (ref 135–145)

## 2020-11-04 LAB — GLUCOSE, CAPILLARY: Glucose-Capillary: 155 mg/dL — ABNORMAL HIGH (ref 70–99)
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
## 2020-11-04 MED ORDER — SODIUM CHLORIDE 0.9% IV SOLUTION: Freq: Once | INTRAVENOUS | Status: DC

## 2020-11-04 MED ORDER — GABAPENTIN 250 MG/5ML PO SOLN
100.0000 mg | Freq: Three times a day (TID) | ORAL | Status: DC
  Administered 2020-11-04 – 2020-11-06 (×5): 100 mg via ORAL
  Filled 2020-11-04 (×9): qty 2

## 2020-11-04 MED ORDER — RACEPINEPHRINE HCL 2.25 % IN NEBU
0.5000 mL | INHALATION_SOLUTION | Freq: Once | RESPIRATORY_TRACT | Status: DC
  Filled 2020-11-04: qty 0.5

## 2020-11-04 MED ORDER — LACTATED RINGERS IV SOLN: INTRAVENOUS | Status: DC

## 2020-11-04 MED ORDER — INFLUENZA VAC SPLIT QUAD 0.5 ML IM SUSY
0.5000 mL | PREFILLED_SYRINGE | INTRAMUSCULAR | Status: DC
  Filled 2020-11-04: qty 0.5

## 2020-11-04 MED ORDER — WHITE PETROLATUM EX OINT
TOPICAL_OINTMENT | CUTANEOUS | Status: AC
  Filled 2020-11-04: qty 28.35

## 2020-11-04 MED ORDER — METHOCARBAMOL 500 MG PO TABS
1000.0000 mg | ORAL_TABLET | Freq: Three times a day (TID) | ORAL | Status: DC
  Administered 2020-11-05 – 2020-11-06 (×5): 1000 mg via ORAL
  Filled 2020-11-04 (×5): qty 2

## 2020-11-04 MED ORDER — CALCIUM GLUCONATE-NACL 1-0.675 GM/50ML-% IV SOLN
1.0000 g | Freq: Once | INTRAVENOUS | Status: AC
  Administered 2020-11-04: 1000 mg via INTRAVENOUS
  Filled 2020-11-04: qty 50

## 2020-11-04 MED ORDER — GUAIFENESIN 100 MG/5ML PO SOLN
10.0000 mL | Freq: Four times a day (QID) | ORAL | Status: DC
  Administered 2020-11-05 – 2020-11-10 (×22): 200 mg via ORAL
  Filled 2020-11-04: qty 10
  Filled 2020-11-04: qty 30
  Filled 2020-11-04 (×2): qty 10
  Filled 2020-11-04: qty 15
  Filled 2020-11-04 (×14): qty 10
  Filled 2020-11-04 (×2): qty 15
  Filled 2020-11-04: qty 10

## 2020-11-04 MED ORDER — POLYETHYLENE GLYCOL 3350 17 G PO PACK
17.0000 g | PACK | Freq: Every day | ORAL | Status: DC
  Administered 2020-11-06: 17 g via ORAL
  Filled 2020-11-04: qty 1

## 2020-11-04 MED ORDER — DOCUSATE SODIUM 50 MG/5ML PO LIQD
100.0000 mg | Freq: Two times a day (BID) | ORAL | Status: DC
  Administered 2020-11-04 – 2020-11-07 (×5): 100 mg via ORAL
  Filled 2020-11-04 (×7): qty 10

## 2020-11-04 MED ORDER — SODIUM CHLORIDE 0.9 % IV SOLN: INTRAVENOUS | Status: DC | PRN

## 2020-11-04 MED ORDER — OXYCODONE HCL 5 MG/5ML PO SOLN
5.0000 mg | ORAL | Status: DC | PRN
  Administered 2020-11-04 – 2020-11-06 (×7): 10 mg via ORAL
  Administered 2020-11-06 – 2020-11-07 (×3): 5 mg via ORAL
  Administered 2020-11-07 – 2020-11-08 (×3): 10 mg via ORAL
  Administered 2020-11-08: 5 mg via ORAL
  Administered 2020-11-08 – 2020-11-10 (×5): 10 mg via ORAL
  Filled 2020-11-04 (×2): qty 10
  Filled 2020-11-04: qty 5
  Filled 2020-11-04: qty 10
  Filled 2020-11-04 (×2): qty 5
  Filled 2020-11-04 (×13): qty 10

## 2020-11-04 MED ORDER — RACEPINEPHRINE HCL 2.25 % IN NEBU: 0.5000 mL | INHALATION_SOLUTION | RESPIRATORY_TRACT | Status: DC | PRN

## 2020-11-04 MED ORDER — ACETAMINOPHEN 160 MG/5ML PO SOLN
1000.0000 mg | Freq: Four times a day (QID) | ORAL | Status: DC
  Administered 2020-11-05 – 2020-11-10 (×22): 1000 mg via ORAL
  Filled 2020-11-04 (×22): qty 40.6

## 2020-11-04 MED ORDER — MORPHINE SULFATE (PF) 2 MG/ML IV SOLN
2.0000 mg | INTRAVENOUS | Status: DC | PRN
  Administered 2020-11-04 (×3): 2 mg via INTRAVENOUS
  Administered 2020-11-04: 4 mg via INTRAVENOUS
  Administered 2020-11-04: 2 mg via INTRAVENOUS
  Administered 2020-11-04: 4 mg via INTRAVENOUS
  Administered 2020-11-04 – 2020-11-05 (×2): 2 mg via INTRAVENOUS
  Administered 2020-11-05 – 2020-11-09 (×13): 4 mg via INTRAVENOUS
  Filled 2020-11-04 (×6): qty 2
  Filled 2020-11-04: qty 1
  Filled 2020-11-04 (×3): qty 2
  Filled 2020-11-04: qty 1
  Filled 2020-11-04 (×3): qty 2
  Filled 2020-11-04: qty 1
  Filled 2020-11-04 (×4): qty 2
  Filled 2020-11-04 (×3): qty 1
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
## 2020-11-04 NOTE — Progress Notes (Signed)
[facility]   Transfusion today  Plan to extubate today   MRI R knee after extubation  CT R ankle looks stable, no indication of syndesmotic injury   Will allow for WBAT R leg in CAM for transfers only TDWB L leg for transfers  Posterior hip precautions left hip   Sutures out L hip in 10 days   Ongoing tertiary survey   Mearl Latin, PA-C [phone] (C) 11/04/2020, 9:45 AM  [facility] [address] [place] [address] [phone] Collier Bullock (F)

## 2020-11-04 NOTE — Plan of Care (Signed)
  Problem: Education: Goal: Knowledge of General Education information will improve Description: Including pain rating scale, medication(s)/side effects and non-pharmacologic comfort measures Outcome: Progressing   Problem: Clinical Measurements: Goal: Ability to maintain clinical measurements within normal limits will improve Outcome: Progressing Goal: Will remain free from infection Outcome: Progressing Goal: Diagnostic test results will improve Outcome: Progressing Goal: Respiratory complications will improve Outcome: Progressing Goal: Cardiovascular complication will be avoided Outcome: Progressing   Problem: Activity: Goal: Risk for activity intolerance will decrease Outcome: Progressing   Problem: Nutrition: Goal: Adequate nutrition will be maintained Outcome: Progressing   Problem: Elimination: Goal: Will not experience complications related to bowel motility Outcome: Progressing Goal: Will not experience complications related to urinary retention Outcome: Progressing   Problem: Pain Managment: Goal: General experience of comfort will improve Outcome: Progressing   Problem: Safety: Goal: Ability to remain free from injury will improve Outcome: Progressing   Problem: Skin Integrity: Goal: Risk for impaired skin integrity will decrease Outcome: Progressing   Problem: Education: Goal: Knowledge of risk factors and measures for prevention of condition will improve Outcome: Progressing   Problem: Coping: Goal: Psychosocial and spiritual needs will be supported Outcome: Progressing   Problem: Respiratory: Goal: Will maintain a patent airway Outcome: Progressing Goal: Complications related to the disease process, condition or treatment will be avoided or minimized Outcome: Progressing   
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
## 2020-11-04 NOTE — Procedures (Signed)
Extubation Procedure Note  Patient Details:   Name: Madison Cook DOB: 1996/06/12 MRN: [id number]   Airway Documentation:    Vent end date: 11/04/20 Vent end time: 1112   Evaluation  O2 sats: stable throughout Complications: No apparent complications Patient did tolerate procedure well. Bilateral Breath Sounds: Clear   Pt extubated to 4L  per MD order. Pt had positive cuff leak prior to extubation. No stridor noted. Vitals are stable.  Guss Bunde 11/04/2020, 11:14 AM

## 2020-11-04 NOTE — Progress Notes (Signed)
CRITICAL VALUE ALERT  Critical Value:  Hemoglobin 6.9  Date & Time Notied:  11/04/20 0630  Provider Notified: Fredricka Bonine  Orders Received/Actions taken: Waiting for response  CRITICAL VALUE ALERT  Critical Value:  Calcium 6.0  Date & Time Notied: 11/04/20 0640  Provider Notified: Dr. Fredricka Bonine  Orders Received/Actions taken: waiting for response

## 2020-11-05 ENCOUNTER — Ambulatory Visit: Payer: Commercial Managed Care - PPO | Admitting: Radiation Oncology

## 2020-11-05 LAB — BASIC METABOLIC PANEL
Anion gap: 12 (ref 5–15)
BUN: 9 mg/dL (ref 6–20)
CO2: 21 mmol/L — ABNORMAL LOW (ref 22–32)
Calcium: 7.8 mg/dL — ABNORMAL LOW (ref 8.9–10.3)
Chloride: 107 mmol/L (ref 98–111)
Creatinine, Ser: 0.64 mg/dL (ref 0.44–1.00)
GFR, Estimated: >60 mL/min (ref >60)
Glucose, Bld: 116 mg/dL — ABNORMAL HIGH (ref 70–99)
Potassium: 3.8 mmol/L (ref 3.5–5.1)
Sodium: 140 mmol/L (ref 135–145)

## 2020-11-05 LAB — CBC
HCT: 26.9 % — ABNORMAL LOW (ref 36.0–46.0)
Hemoglobin: 8.5 g/dL — ABNORMAL LOW (ref 12.0–15.0)
MCH: 27.9 pg (ref 26.0–34.0)
MCHC: 31.6 g/dL (ref 30.0–36.0)
MCV: 88.2 fL (ref 80.0–100.0)
Platelets: 302 10*3/uL (ref 150–400)
RBC: 3.05 MIL/uL — ABNORMAL LOW (ref 3.87–5.11)
RDW: 14.3 % (ref 11.5–15.5)
WBC: 15.8 10*3/uL — ABNORMAL HIGH (ref 4.0–10.5)
nRBC: 0.2 % (ref 0.0–0.2)
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
## 2020-11-05 MED ORDER — LIDOCAINE 5 % EX PTCH
1.0000 | MEDICATED_PATCH | CUTANEOUS | Status: DC
  Administered 2020-11-05 – 2020-11-10 (×6): 1 via TRANSDERMAL
  Filled 2020-11-05 (×6): qty 1

## 2020-11-05 MED ORDER — ALPRAZOLAM 0.25 MG PO TABS
0.2500 mg | ORAL_TABLET | Freq: Three times a day (TID) | ORAL | Status: DC | PRN
  Administered 2020-11-05 – 2020-11-09 (×9): 0.25 mg via ORAL
  Filled 2020-11-05 (×9): qty 1

## 2020-11-05 MED ORDER — SIMETHICONE 80 MG PO CHEW
80.0000 mg | CHEWABLE_TABLET | Freq: Four times a day (QID) | ORAL | Status: DC | PRN
  Administered 2020-11-05 – 2020-11-09 (×8): 80 mg via ORAL
  Filled 2020-11-05 (×8): qty 1

## 2020-11-05 MED ORDER — VITAMIN D 25 MCG (1000 UNIT) PO TABS
2000.0000 [IU] | ORAL_TABLET | Freq: Two times a day (BID) | ORAL | Status: DC
  Administered 2020-11-05 – 2020-11-10 (×11): 2000 [IU] via ORAL
  Filled 2020-11-05 (×11): qty 2

## 2020-11-05 MED ORDER — HYDROXYZINE PAMOATE 25 MG PO CAPS
25.0000 mg | ORAL_CAPSULE | Freq: Three times a day (TID) | ORAL | Status: DC | PRN
  Filled 2020-11-05: qty 2

## 2020-11-05 MED ORDER — VITAMIN D (ERGOCALCIFEROL) 1.25 MG (50000 UNIT) PO CAPS
50000.0000 [IU] | ORAL_CAPSULE | ORAL | Status: DC
  Administered 2020-11-05: 50000 [IU] via ORAL
  Filled 2020-11-05: qty 1

## 2020-11-05 MED ORDER — POLYVINYL ALCOHOL 1.4 % OP SOLN
1.0000 [drp] | OPHTHALMIC | Status: DC | PRN
  Administered 2020-11-05: 2 [drp] via OPHTHALMIC
  Filled 2020-11-05: qty 15

## 2020-11-05 MED ORDER — SERTRALINE HCL 50 MG PO TABS
50.0000 mg | ORAL_TABLET | Freq: Every day | ORAL | Status: DC
  Administered 2020-11-05 – 2020-11-10 (×6): 50 mg via ORAL
  Filled 2020-11-05 (×6): qty 1

## 2020-11-05 MED ORDER — HYDROXYZINE HCL 25 MG PO TABS
25.0000 mg | ORAL_TABLET | Freq: Three times a day (TID) | ORAL | Status: DC | PRN
  Filled 2020-11-05: qty 2

## 2020-11-05 NOTE — Progress Notes (Signed)
Orthopedic Tech Progress Note Patient Details:  Madison Cook November 01, 1995 [id number] Ordered brace Patient ID: [name], female   DOB: 1996/08/07, 26 y.o.   MRN: [id number]   Michelle Piper 11/05/2020, 11:26 AM
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
## 2020-11-05 NOTE — Progress Notes (Signed)
Inpatient Rehab Admissions Coordinator Note:   Per therapy recommendations, pt was screened for CIR candidacy by Estill Dooms, PT, DPT.  Note pt's therapy evaluations limited by pain/anxiety (meds started today).  Noted COVID + 1/30. Patients are eligible to be considered for admit to the [facility] when cleared from airborne precautions by acute MD or Infectious disease regardless of onset day.  I will follow from a distance, and rescreen for candidacy on 2/7.   Please contact me with questions.   Estill Dooms, PT, DPT [phone] 11/05/20 3:10 PM
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
## 2020-11-06 ENCOUNTER — Inpatient Hospital Stay (HOSPITAL_COMMUNITY): Payer: Commercial Managed Care - PPO

## 2020-11-06 DIAGNOSIS — D72829 Elevated white blood cell count, unspecified: Secondary | ICD-10-CM

## 2020-11-06 LAB — CBC
HCT: 29.4 % — ABNORMAL LOW (ref 36.0–46.0)
Hemoglobin: 9.4 g/dL — ABNORMAL LOW (ref 12.0–15.0)
MCH: 28 pg (ref 26.0–34.0)
MCHC: 32 g/dL (ref 30.0–36.0)
MCV: 87.5 fL (ref 80.0–100.0)
Platelets: 412 10*3/uL — ABNORMAL HIGH (ref 150–400)
RBC: 3.36 MIL/uL — ABNORMAL LOW (ref 3.87–5.11)
RDW: 14.3 % (ref 11.5–15.5)
WBC: 18.3 10*3/uL — ABNORMAL HIGH (ref 4.0–10.5)
nRBC: 0.4 % — ABNORMAL HIGH (ref 0.0–0.2)

## 2020-11-06 LAB — BASIC METABOLIC PANEL
Anion gap: 11 (ref 5–15)
BUN: 5 mg/dL — ABNORMAL LOW (ref 6–20)
CO2: 24 mmol/L (ref 22–32)
Calcium: 8.1 mg/dL — ABNORMAL LOW (ref 8.9–10.3)
Chloride: 102 mmol/L (ref 98–111)
Creatinine, Ser: 0.62 mg/dL (ref 0.44–1.00)
GFR, Estimated: >60 mL/min (ref >60)
Glucose, Bld: 104 mg/dL — ABNORMAL HIGH (ref 70–99)
Potassium: 3.6 mmol/L (ref 3.5–5.1)
Sodium: 137 mmol/L (ref 135–145)

## 2020-11-06 LAB — URINALYSIS, ROUTINE W REFLEX MICROSCOPIC
Bilirubin Urine: NEGATIVE
Glucose, UA: NEGATIVE mg/dL
Ketones, ur: NEGATIVE mg/dL
Leukocytes,Ua: NEGATIVE
Nitrite: NEGATIVE
Protein, ur: 30 mg/dL — AB
RBC / HPF: >50 RBC/hpf — ABNORMAL HIGH (ref 0–5)
Specific Gravity, Urine: 1.018 (ref 1.005–1.030)
pH: 7 (ref 5.0–8.0)

## 2020-11-06 IMAGING — DX DG CHEST 1V PORT
1 series · 1 of 1 positions shown · non-contrast
Comparison: [DATE]

CLINICAL DATA: COVID pneumonia

EXAM:
PORTABLE CHEST 1 VIEW

[chest ap]
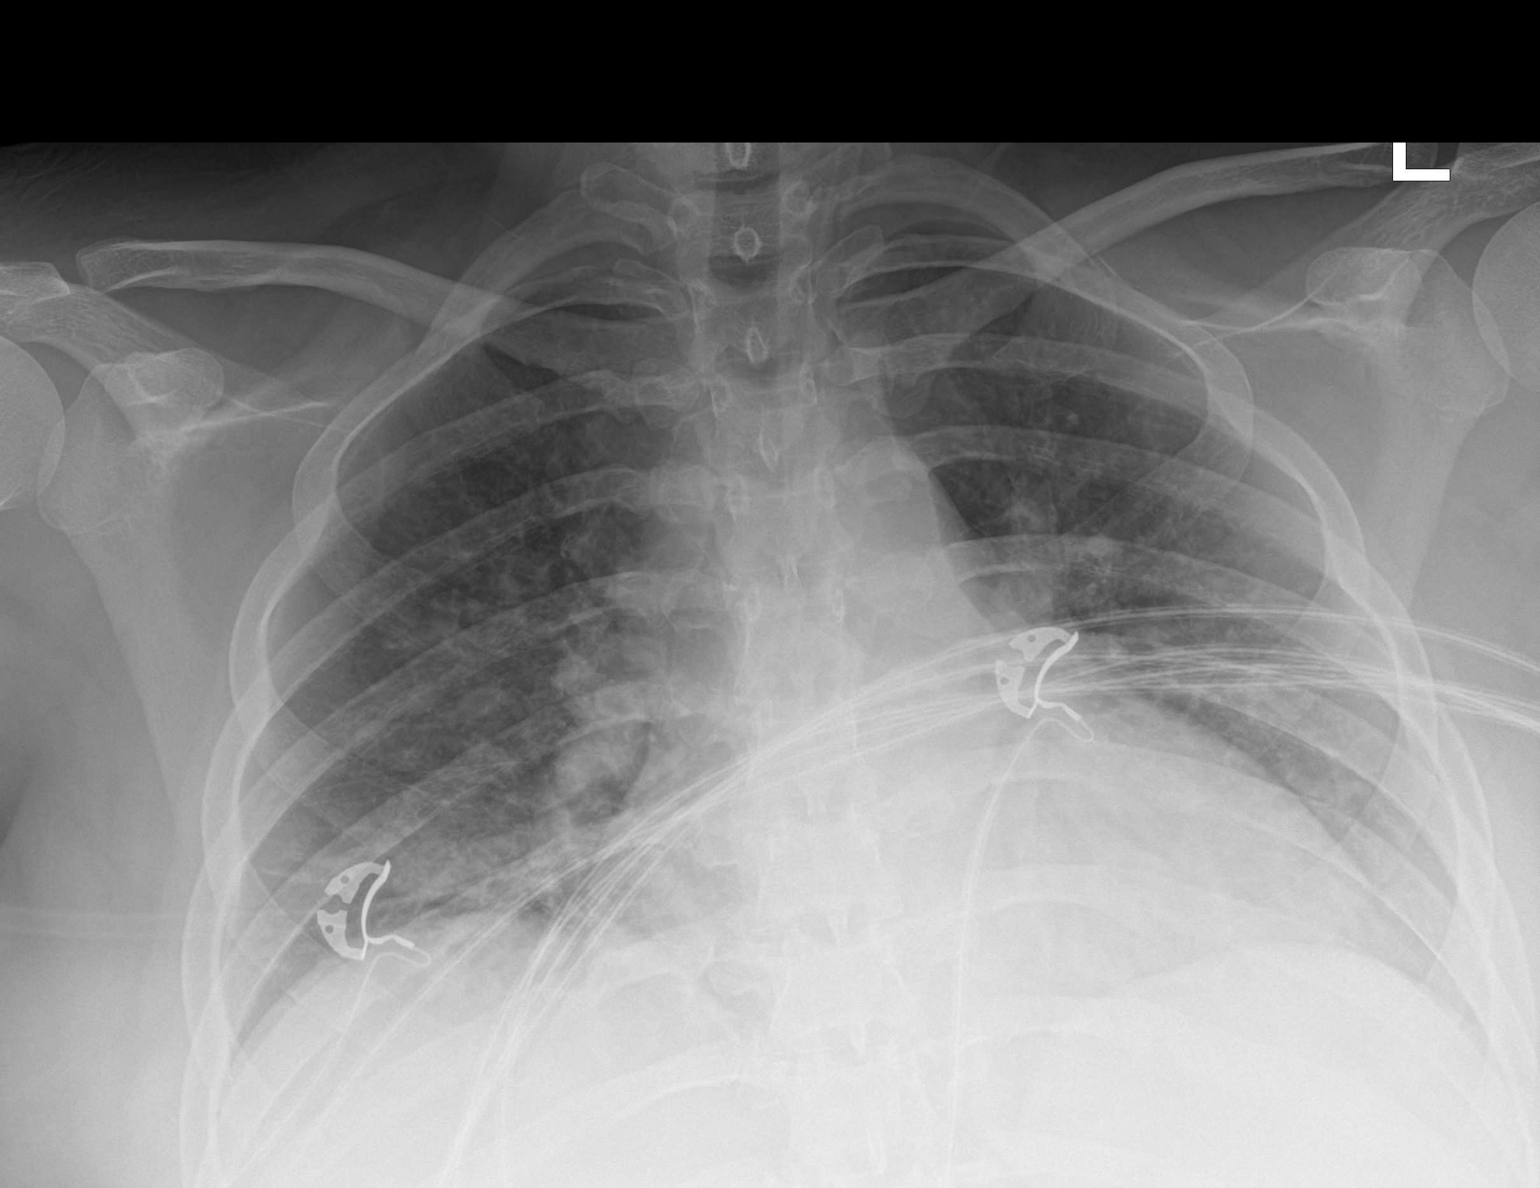

[1 of 1 positions shown; findings below may reference images not displayed]

FINDINGS: Interval extubation and removal of nasogastric tube. Lung volumes
are small, but appears stable since prior examination. Superimposed
patchy airspace infiltrate has developed within the right mid and
lower lung zone, infectious or inflammatory. No pneumothorax or
pleural effusion. Cardiac size appears mildly enlarged, potentially
exacerbated by semi-erect positioning.
IMPRESSION: Interval extubation.  Stable pulmonary hypoinflation.

Interval development of patchy asymmetric right basilar pulmonary
infiltrate, possibly infectious or inflammatory.

## 2020-11-06 MED ORDER — ALBUTEROL SULFATE HFA 108 (90 BASE) MCG/ACT IN AERS
2.0000 | INHALATION_SPRAY | Freq: Four times a day (QID) | RESPIRATORY_TRACT | Status: DC | PRN
  Filled 2020-11-06: qty 6.7

## 2020-11-06 MED ORDER — METHOCARBAMOL 500 MG PO TABS
1000.0000 mg | ORAL_TABLET | Freq: Four times a day (QID) | ORAL | Status: DC
  Administered 2020-11-06 – 2020-11-10 (×17): 1000 mg via ORAL
  Filled 2020-11-06 (×17): qty 2

## 2020-11-06 MED ORDER — POLYETHYLENE GLYCOL 3350 17 G PO PACK
17.0000 g | PACK | Freq: Two times a day (BID) | ORAL | Status: DC
  Administered 2020-11-06: 17 g via ORAL
  Filled 2020-11-06 (×2): qty 1

## 2020-11-06 MED ORDER — GABAPENTIN 100 MG PO CAPS
200.0000 mg | ORAL_CAPSULE | Freq: Three times a day (TID) | ORAL | Status: DC
  Administered 2020-11-06 – 2020-11-10 (×13): 200 mg via ORAL
  Filled 2020-11-06 (×13): qty 2
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
## 2020-11-06 NOTE — Progress Notes (Signed)
Bilateral lower extremity venous study completed.      Please see CV Proc for preliminary results.   Ronnette Rump, RVT  
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
## 2020-11-06 NOTE — Progress Notes (Signed)
Patient admitted to unit via 4N-ICU. Pt is A&Ox4 and vitals WNL excluding elevated HR due to pain (patient just received pain medication before transfer).    Personal belongings include a cell phone that is with the patient 2 chargers one blue and one purple. Also a bag of clothes that was worn at admission time.   Patient oriented to unit. Call bell in reach.
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
## 2020-11-06 NOTE — Progress Notes (Addendum)
64 Mom says pt has very severe post traumatic depression and needs her hydroxyzine to help her support her mental health and maintain her independence.   1601 PT got pt into the bedside commode where the pt voided and had a BM. Patient did not tolerate getting back into bed from the commode well. PT/OT recommends pt be put on the bedpan when she needs to use the bathroom and not the bedside commode.    1622 Left and Right IV's removed due to poor vascular access and pain described by patient.  Mikki Harbor, RN

## 2020-11-06 NOTE — Plan of Care (Signed)

## 2020-11-07 ENCOUNTER — Inpatient Hospital Stay (HOSPITAL_COMMUNITY): Payer: Commercial Managed Care - PPO

## 2020-11-07 LAB — BPAM RBC
Blood Product Expiration Date: 202202062359
Blood Product Expiration Date: 202202062359
Blood Product Expiration Date: 202202072359
Blood Product Expiration Date: 202202142359
ISSUE DATE / TIME: 202202011609
ISSUE DATE / TIME: 202202020956
Unit Type and Rh: 600
Unit Type and Rh: 600
Unit Type and Rh: 9500
Unit Type and Rh: 9500

## 2020-11-07 LAB — CBC
HCT: 29.8 % — ABNORMAL LOW (ref 36.0–46.0)
Hemoglobin: 9.4 g/dL — ABNORMAL LOW (ref 12.0–15.0)
MCH: 27.6 pg (ref 26.0–34.0)
MCHC: 31.5 g/dL (ref 30.0–36.0)
MCV: 87.6 fL (ref 80.0–100.0)
Platelets: 417 K/uL — ABNORMAL HIGH (ref 150–400)
RBC: 3.4 MIL/uL — ABNORMAL LOW (ref 3.87–5.11)
RDW: 14 % (ref 11.5–15.5)
WBC: 24 K/uL — ABNORMAL HIGH (ref 4.0–10.5)
nRBC: 0.2 % (ref 0.0–0.2)

## 2020-11-07 LAB — TYPE AND SCREEN
ABO/RH(D): A NEG
Antibody Screen: NEGATIVE
Donor AG Type: NEGATIVE
Donor AG Type: NEGATIVE
PT AG Type: NEGATIVE
Unit division: 0
Unit division: 0
Unit division: 0
Unit division: 0

## 2020-11-07 LAB — BASIC METABOLIC PANEL WITH GFR
Anion gap: 12 (ref 5–15)
BUN: 9 mg/dL (ref 6–20)
CO2: 22 mmol/L (ref 22–32)
Calcium: 8.3 mg/dL — ABNORMAL LOW (ref 8.9–10.3)
Chloride: 104 mmol/L (ref 98–111)
Creatinine, Ser: 0.48 mg/dL (ref 0.44–1.00)
GFR, Estimated: >60 mL/min
Glucose, Bld: 105 mg/dL — ABNORMAL HIGH (ref 70–99)
Potassium: 3.5 mmol/L (ref 3.5–5.1)
Sodium: 138 mmol/L (ref 135–145)

## 2020-11-07 LAB — C DIFFICILE (CDIFF) QUICK SCRN (NO PCR REFLEX)
C Diff antigen: POSITIVE — AB
C Diff interpretation: DETECTED
C Diff toxin: POSITIVE — AB
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
## 2020-11-07 MED ORDER — VANCOMYCIN 50 MG/ML ORAL SOLUTION
125.0000 mg | Freq: Four times a day (QID) | ORAL | Status: DC
  Administered 2020-11-07 – 2020-11-10 (×11): 125 mg via ORAL
  Filled 2020-11-07 (×15): qty 2.5

## 2020-11-07 MED ORDER — IOHEXOL 300 MG/ML  SOLN
100.0000 mL | Freq: Once | INTRAMUSCULAR | Status: AC | PRN
  Administered 2020-11-07: 100 mL via INTRAVENOUS

## 2020-11-07 MED ORDER — LACTATED RINGERS IV SOLN
INTRAVENOUS | Status: DC
  Administered 2020-11-08: 1000 mL via INTRAVENOUS
  Administered 2020-11-09: 100 mL via INTRAVENOUS
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
## 2020-11-07 NOTE — Progress Notes (Addendum)
0900 Spoke with pt and family, they are requesting COVID test redone. Doctor has been notified and has ordered a PCR test to be brought up for the pt.  1025 CT arrived to take the pt down for imaging. PT removed from IV line and pure-wick and was transported via her bed.   Mikki Harbor, RN
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
## 2020-11-07 NOTE — Progress Notes (Signed)
D/w with Dr Dossie Der  Pt seen & examined Increasing wbc  Reports increasing abd pain over the past few days Reports loose stool as well Nurse confirms very watery stool as well  Alert, nontoxic Obese, soft, ttp throughout abd  Dr Dossie Der discussed CT results with radiology I reviewed as well Appears to have some pancolitis with trace free fluid No free air, no PV gas/pneumatosis  She doesn't have peritonitis  I think she is ok for CLD - at risk for ileus given colonic inflammation d/w with CMO about cdiff test who agreed given increased wbc, ct findings.   Will check cdiff If positive will consult pharm for c diff txt  Mary Sella. Andrey Campanile, MD, FACS General, Bariatric, & [facility], [place]

## 2020-11-07 NOTE — Progress Notes (Signed)
1520 Patient request to speak with the doctor. Her and her family are very upset they can not be around each other. Patient constantly states that she wants to leave [facility] if her parents can not visit due to her Covid-19 precautions. I printed out [facility] Covid protocol and provided teaching to the mom and patient and listed reasons why the patient can not have visitors until after her ten days since being tested positive.  Parent seems to understand and also apologized to me for the fussing but is also concerned for her daughters mental health.  Moved patient from chair to bed with the help of Felicia NT. Patient tolerated fairly well. Patient asked for orange and water but is on NPO until specified so she was reoriented to her diet changes.  Mikki Harbor, RN
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
## 2020-11-08 LAB — CBC
HCT: 29.7 % — ABNORMAL LOW (ref 36.0–46.0)
Hemoglobin: 9.3 g/dL — ABNORMAL LOW (ref 12.0–15.0)
MCH: 27.4 pg (ref 26.0–34.0)
MCHC: 31.3 g/dL (ref 30.0–36.0)
MCV: 87.6 fL (ref 80.0–100.0)
Platelets: 456 10*3/uL — ABNORMAL HIGH (ref 150–400)
RBC: 3.39 MIL/uL — ABNORMAL LOW (ref 3.87–5.11)
RDW: 14 % (ref 11.5–15.5)
WBC: 18.7 10*3/uL — ABNORMAL HIGH (ref 4.0–10.5)
nRBC: 0.4 % — ABNORMAL HIGH (ref 0.0–0.2)
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
## 2020-11-09 LAB — CBC
HCT: 30 % — ABNORMAL LOW (ref 36.0–46.0)
Hemoglobin: 9.3 g/dL — ABNORMAL LOW (ref 12.0–15.0)
MCH: 27.6 pg (ref 26.0–34.0)
MCHC: 31 g/dL (ref 30.0–36.0)
MCV: 89 fL (ref 80.0–100.0)
Platelets: 473 10*3/uL — ABNORMAL HIGH (ref 150–400)
RBC: 3.37 MIL/uL — ABNORMAL LOW (ref 3.87–5.11)
RDW: 13.9 % (ref 11.5–15.5)
WBC: 16.1 10*3/uL — ABNORMAL HIGH (ref 4.0–10.5)
nRBC: 0.5 % — ABNORMAL HIGH (ref 0.0–0.2)

## 2020-11-09 LAB — BASIC METABOLIC PANEL
Anion gap: 9 (ref 5–15)
BUN: 7 mg/dL (ref 6–20)
CO2: 27 mmol/L (ref 22–32)
Calcium: 8.3 mg/dL — ABNORMAL LOW (ref 8.9–10.3)
Chloride: 103 mmol/L (ref 98–111)
Creatinine, Ser: 0.31 mg/dL — ABNORMAL LOW (ref 0.44–1.00)
GFR, Estimated: >60 mL/min (ref >60)
Glucose, Bld: 96 mg/dL (ref 70–99)
Potassium: 3 mmol/L — ABNORMAL LOW (ref 3.5–5.1)
Sodium: 139 mmol/L (ref 135–145)

## 2020-11-09 MED ORDER — CALCIUM CARBONATE ANTACID 500 MG PO CHEW
400.0000 mg | CHEWABLE_TABLET | Freq: Two times a day (BID) | ORAL | Status: AC
  Administered 2020-11-09 – 2020-11-10 (×2): 400 mg via ORAL
  Filled 2020-11-09 (×2): qty 2

## 2020-11-09 MED ORDER — PROCHLORPERAZINE EDISYLATE 10 MG/2ML IJ SOLN
10.0000 mg | Freq: Four times a day (QID) | INTRAMUSCULAR | Status: DC | PRN
  Administered 2020-11-09: 10 mg via INTRAVENOUS
  Filled 2020-11-09: qty 2

## 2020-11-09 MED ORDER — NYSTATIN 100000 UNIT/ML MT SUSP
5.0000 mL | Freq: Four times a day (QID) | OROMUCOSAL | Status: DC
  Administered 2020-11-09 – 2020-11-10 (×6): 500000 [IU] via ORAL
  Filled 2020-11-09 (×6): qty 5

## 2020-11-09 MED ORDER — POTASSIUM CHLORIDE CRYS ER 20 MEQ PO TBCR
40.0000 meq | EXTENDED_RELEASE_TABLET | Freq: Two times a day (BID) | ORAL | Status: AC
  Administered 2020-11-09 (×2): 40 meq via ORAL
  Filled 2020-11-09 (×2): qty 2
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
## 2020-11-09 NOTE — Plan of Care (Signed)
  Problem: Education: Goal: Knowledge of General Education information will improve Description: Including pain rating scale, medication(s)/side effects and non-pharmacologic comfort measures Outcome: Progressing   Problem: Clinical Measurements: Goal: Ability to maintain clinical measurements within normal limits will improve Outcome: Progressing   
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
## 2020-11-10 ENCOUNTER — Other Ambulatory Visit (HOSPITAL_COMMUNITY): Payer: Self-pay | Admitting: Physician Assistant

## 2020-11-10 LAB — BASIC METABOLIC PANEL
Anion gap: 11 (ref 5–15)
BUN: 6 mg/dL (ref 6–20)
CO2: 25 mmol/L (ref 22–32)
Calcium: 8.6 mg/dL — ABNORMAL LOW (ref 8.9–10.3)
Chloride: 104 mmol/L (ref 98–111)
Creatinine, Ser: 0.46 mg/dL (ref 0.44–1.00)
GFR, Estimated: >60 mL/min (ref >60)
Glucose, Bld: 94 mg/dL (ref 70–99)
Potassium: 3.3 mmol/L — ABNORMAL LOW (ref 3.5–5.1)
Sodium: 140 mmol/L (ref 135–145)

## 2020-11-10 LAB — CBC
HCT: 31 % — ABNORMAL LOW (ref 36.0–46.0)
Hemoglobin: 9.6 g/dL — ABNORMAL LOW (ref 12.0–15.0)
MCH: 27.6 pg (ref 26.0–34.0)
MCHC: 31 g/dL (ref 30.0–36.0)
MCV: 89.1 fL (ref 80.0–100.0)
Platelets: 485 10*3/uL — ABNORMAL HIGH (ref 150–400)
RBC: 3.48 MIL/uL — ABNORMAL LOW (ref 3.87–5.11)
RDW: 14 % (ref 11.5–15.5)
WBC: 14.9 10*3/uL — ABNORMAL HIGH (ref 4.0–10.5)
nRBC: 1 % — ABNORMAL HIGH (ref 0.0–0.2)

## 2020-11-10 MED ORDER — LIDOCAINE 5 % EX PTCH: 1.0000 | MEDICATED_PATCH | CUTANEOUS | 0 refills | Status: DC

## 2020-11-10 MED ORDER — OXYCODONE HCL 5 MG/5ML PO SOLN
5.0000 mg | ORAL | 0 refills | Status: DC | PRN
Start: 2020-11-10 — End: 2020-11-10

## 2020-11-10 MED ORDER — GABAPENTIN 100 MG PO CAPS: 200.0000 mg | ORAL_CAPSULE | Freq: Three times a day (TID) | ORAL | 0 refills | Status: DC

## 2020-11-10 MED ORDER — METHOCARBAMOL 500 MG PO TABS: 1000.0000 mg | ORAL_TABLET | Freq: Four times a day (QID) | ORAL | 0 refills | Status: DC

## 2020-11-10 MED ORDER — VITAMIN D3 25 MCG PO TABS: 2000.0000 [IU] | ORAL_TABLET | Freq: Two times a day (BID) | ORAL | Status: DC

## 2020-11-10 MED ORDER — NYSTATIN 100000 UNIT/ML MT SUSP: 5.0000 mL | Freq: Four times a day (QID) | OROMUCOSAL | 0 refills | Status: DC

## 2020-11-10 MED ORDER — RIVAROXABAN 10 MG PO TABS: 10.0000 mg | ORAL_TABLET | Freq: Every day | ORAL | 0 refills | Status: DC

## 2020-11-10 MED ORDER — POTASSIUM CHLORIDE ER 20 MEQ PO TBCR: 20.0000 meq | EXTENDED_RELEASE_TABLET | Freq: Two times a day (BID) | ORAL | 0 refills | Status: DC

## 2020-11-10 MED ORDER — VITAMIN D (ERGOCALCIFEROL) 1.25 MG (50000 UNIT) PO CAPS: 50000.0000 [IU] | ORAL_CAPSULE | ORAL | Status: DC

## 2020-11-10 MED ORDER — ONDANSETRON 4 MG PO TBDP: 4.0000 mg | ORAL_TABLET | Freq: Four times a day (QID) | ORAL | 0 refills | Status: DC | PRN

## 2020-11-10 MED ORDER — VANCOMYCIN HCL 125 MG PO CAPS: 125.0000 mg | ORAL_CAPSULE | Freq: Four times a day (QID) | ORAL | 0 refills | Status: DC

## 2020-11-10 MED ORDER — OXYCODONE HCL 5 MG PO TABS: 5.0000 mg | ORAL_TABLET | ORAL | 0 refills | Status: DC | PRN

## 2020-11-10 MED FILL — VANCOMYCIN HCL 125 MG CAP: 125 | 7 days supply | Qty: 28 | Fill #0

## 2020-11-10 MED FILL — XARELTO 10 MG TABLET: 10 | 30 days supply | Qty: 30 | Fill #0

## 2020-11-10 MED FILL — POTASSIUM CHLORIDE 20meqER: 20 | 10 days supply | Qty: 20 | Fill #0

## 2020-11-10 MED FILL — METHOCARBAMOL 500 MG TABS: 500 | 7 days supply | Qty: 60 | Fill #0

## 2020-11-10 MED FILL — oxyCODONE HCL 5 MG TABS: 5 | 4 days supply | Qty: 40 | Fill #0

## 2020-11-10 MED FILL — GABAPENTIN 100 MG CAPSULE: 100 | 15 days supply | Qty: 90 | Fill #0

## 2020-11-10 MED FILL — NYSTATIN 100000 UNIT/ML SUS: 100000 | 4 days supply | Qty: 60 | Fill #0

## 2020-11-10 MED FILL — ONDANSETRON ODT 4 MG TABLET: 4 | 5 days supply | Qty: 20 | Fill #0

## 2020-11-10 MED FILL — LIDOCAINE PATCH 5%: 5 | 30 days supply | Qty: 30 | Fill #0
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
## 2020-11-10 NOTE — TOC Transition Note (Addendum)
Transition of Care [facility]) - CM/SW Discharge Note   Patient Details  Name: Madison Cook MRN: [id number] Date of Birth: 03/05/1996  [facility] of [facility]) CM/SW Contact:  Glennon Mac, RN Phone Number: 11/10/2020, 12:36 PM   Clinical Narrative:  Pt medically stable for discharge home today.  DME to be delivered to patient's home by [employer] today, per pt/family's request.  DC meds filled in [facility] pharmacy; pt unable to come up with $33 copay for medications.  Rx paid for by [facility] petty cash.  Called [place] Transportation Coordinator at 12:30pm to arrange transport via stretcher to home.  Coordinator to call TOC CM with ETA when available.    Transportation Coordinator estimates [facility] to arrive around 1:30 for pickup.    Final next level of care: Home w Home Health Services Barriers to Discharge: Barriers Resolved   Patient Goals and CMS Choice Patient states their goals for this hospitalization and ongoing recovery are:: to go home CMS Medicare.gov Compare Post Acute Care list provided to:: Patient Choice offered to / list presented to : Patient                      Discharge Plan and Services   Discharge Planning Services: CM Consult Post Acute Care Choice: Home Health          DME Arranged: 3-N-1,Wheelchair manual,Tub bench,Other see comment DME Agency: [employer] Date DME Agency Contacted: 11/09/20 Time DME Agency Contacted: [phone] Representative spoke with at DME Agency: Francesco Sor Arranged: PT,OT HH Agency: [facility] Date [facility] Agency Contacted: 11/09/20 Time HH Agency Contacted: [phone] Representative spoke with at [facility] Agency: Lorenza Chick  Social Determinants of Health (SDOH) Interventions     Readmission Risk Interventions Readmission Risk Prevention Plan 11/09/2020  Transportation Screening Complete  PCP or Specialist Appt within 5-7 Days Complete  Home Care Screening Complete  Medication Review (RN CM) Complete  Some recent data might  be hidden   Quintella Baton, RN, BSN  Trauma/Neuro ICU Case Manager [phone]
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
## 2020-11-12 ENCOUNTER — Observation Stay (HOSPITAL_COMMUNITY)
Admission: EM | Admit: 2020-11-12 | Discharge: 2020-11-14 | Disposition: A | Discharge status: Home / Self Care | Payer: Commercial Managed Care - PPO | Attending: Surgery | Admitting: Surgery

## 2020-11-12 ENCOUNTER — Emergency Department (HOSPITAL_COMMUNITY): Payer: Commercial Managed Care - PPO

## 2020-11-12 DIAGNOSIS — R569 Unspecified convulsions: Secondary | ICD-10-CM

## 2020-11-12 DIAGNOSIS — R55 Syncope and collapse: Principal | ICD-10-CM | POA: Insufficient documentation

## 2020-11-12 DIAGNOSIS — J45909 Unspecified asthma, uncomplicated: Secondary | ICD-10-CM | POA: Diagnosis not present

## 2020-11-12 DIAGNOSIS — R064 Hyperventilation: Secondary | ICD-10-CM | POA: Diagnosis not present

## 2020-11-12 DIAGNOSIS — S32402A Unspecified fracture of left acetabulum, initial encounter for closed fracture: Secondary | ICD-10-CM

## 2020-11-12 DIAGNOSIS — R52 Pain, unspecified: Secondary | ICD-10-CM

## 2020-11-12 DIAGNOSIS — Z79899 Other long term (current) drug therapy: Secondary | ICD-10-CM | POA: Insufficient documentation

## 2020-11-12 DIAGNOSIS — F411 Generalized anxiety disorder: Secondary | ICD-10-CM | POA: Diagnosis not present

## 2020-11-12 DIAGNOSIS — Z87828 Personal history of other (healed) physical injury and trauma: Secondary | ICD-10-CM

## 2020-11-12 LAB — CBC WITH DIFFERENTIAL/PLATELET
Abs Immature Granulocytes: 1.11 10*3/uL — ABNORMAL HIGH (ref 0.00–0.07)
Basophils Absolute: 0 10*3/uL (ref 0.0–0.1)
Basophils Relative: 0 %
Eosinophils Absolute: 0.2 10*3/uL (ref 0.0–0.5)
Eosinophils Relative: 1 %
HCT: 34.2 % — ABNORMAL LOW (ref 36.0–46.0)
Hemoglobin: 10.3 g/dL — ABNORMAL LOW (ref 12.0–15.0)
Immature Granulocytes: 5 %
Lymphocytes Relative: 11 %
Lymphs Abs: 2.6 10*3/uL (ref 0.7–4.0)
MCH: 27.5 pg (ref 26.0–34.0)
MCHC: 30.1 g/dL (ref 30.0–36.0)
MCV: 91.2 fL (ref 80.0–100.0)
Monocytes Absolute: 1 10*3/uL (ref 0.1–1.0)
Monocytes Relative: 4 %
Neutro Abs: 19 10*3/uL — ABNORMAL HIGH (ref 1.7–7.7)
Neutrophils Relative %: 79 %
Platelets: 503 10*3/uL — ABNORMAL HIGH (ref 150–400)
RBC: 3.75 MIL/uL — ABNORMAL LOW (ref 3.87–5.11)
RDW: 14.6 % (ref 11.5–15.5)
WBC: 24 10*3/uL — ABNORMAL HIGH (ref 4.0–10.5)
nRBC: 0.4 % — ABNORMAL HIGH (ref 0.0–0.2)

## 2020-11-12 LAB — I-STAT CHEM 8, ED
BUN: 10 mg/dL (ref 6–20)
Calcium, Ion: 1.13 mmol/L — ABNORMAL LOW (ref 1.15–1.40)
Chloride: 100 mmol/L (ref 98–111)
Creatinine, Ser: 0.5 mg/dL (ref 0.44–1.00)
Glucose, Bld: 97 mg/dL (ref 70–99)
HCT: 33 % — ABNORMAL LOW (ref 36.0–46.0)
Hemoglobin: 11.2 g/dL — ABNORMAL LOW (ref 12.0–15.0)
Potassium: 4.2 mmol/L (ref 3.5–5.1)
Sodium: 137 mmol/L (ref 135–145)
TCO2: 27 mmol/L (ref 22–32)

## 2020-11-12 LAB — URINALYSIS, ROUTINE W REFLEX MICROSCOPIC
Bilirubin Urine: NEGATIVE
Glucose, UA: NEGATIVE mg/dL
Hgb urine dipstick: NEGATIVE
Ketones, ur: NEGATIVE mg/dL
Leukocytes,Ua: NEGATIVE
Nitrite: NEGATIVE
Protein, ur: NEGATIVE mg/dL
Specific Gravity, Urine: 1.03 (ref 1.005–1.030)
pH: 7 (ref 5.0–8.0)

## 2020-11-12 LAB — TROPONIN I (HIGH SENSITIVITY)
Troponin I (High Sensitivity): 5 ng/L (ref <18)
Troponin I (High Sensitivity): 5 ng/L (ref <18)

## 2020-11-12 LAB — COMPREHENSIVE METABOLIC PANEL
ALT: 54 U/L — ABNORMAL HIGH (ref 0–44)
AST: 39 U/L (ref 15–41)
Albumin: 2.9 g/dL — ABNORMAL LOW (ref 3.5–5.0)
Alkaline Phosphatase: 114 U/L (ref 38–126)
Anion gap: 11 (ref 5–15)
BUN: 10 mg/dL (ref 6–20)
CO2: 26 mmol/L (ref 22–32)
Calcium: 8.9 mg/dL (ref 8.9–10.3)
Chloride: 100 mmol/L (ref 98–111)
Creatinine, Ser: 0.62 mg/dL (ref 0.44–1.00)
GFR, Estimated: >60 mL/min (ref >60)
Glucose, Bld: 97 mg/dL (ref 70–99)
Potassium: 4.1 mmol/L (ref 3.5–5.1)
Sodium: 137 mmol/L (ref 135–145)
Total Bilirubin: 0.8 mg/dL (ref 0.3–1.2)
Total Protein: 7 g/dL (ref 6.5–8.1)

## 2020-11-12 LAB — I-STAT BETA HCG BLOOD, ED (MC, WL, AP ONLY): I-stat hCG, quantitative: <5 m[IU]/mL (ref <5)

## 2020-11-12 LAB — PROTIME-INR
INR: 1.3 — ABNORMAL HIGH (ref 0.8–1.2)
Prothrombin Time: 15.9 seconds — ABNORMAL HIGH (ref 11.4–15.2)

## 2020-11-12 LAB — LACTIC ACID, PLASMA: Lactic Acid, Venous: 1.7 mmol/L (ref 0.5–1.9)
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
## 2020-11-12 MED ORDER — LIDOCAINE 5 % EX PTCH
1.0000 | MEDICATED_PATCH | CUTANEOUS | Status: DC
  Administered 2020-11-13 – 2020-11-14 (×2): 1 via TRANSDERMAL
  Filled 2020-11-12 (×2): qty 1

## 2020-11-12 MED ORDER — ACETAMINOPHEN 500 MG PO TABS
1000.0000 mg | ORAL_TABLET | Freq: Four times a day (QID) | ORAL | Status: DC
  Administered 2020-11-13 – 2020-11-14 (×5): 1000 mg via ORAL
  Filled 2020-11-12 (×6): qty 2

## 2020-11-12 MED ORDER — IOHEXOL 350 MG/ML SOLN
100.0000 mL | Freq: Once | INTRAVENOUS | Status: AC | PRN
  Administered 2020-11-12: 100 mL via INTRAVENOUS

## 2020-11-12 MED ORDER — HYDROMORPHONE HCL 1 MG/ML IJ SOLN
1.0000 mg | INTRAMUSCULAR | Status: DC | PRN
  Administered 2020-11-12 (×2): 1 mg via INTRAVENOUS
  Filled 2020-11-12 (×2): qty 1

## 2020-11-12 MED ORDER — DOCUSATE SODIUM 100 MG PO CAPS
100.0000 mg | ORAL_CAPSULE | Freq: Two times a day (BID) | ORAL | Status: DC
  Administered 2020-11-13 – 2020-11-14 (×3): 100 mg via ORAL
  Filled 2020-11-12 (×3): qty 1

## 2020-11-12 MED ORDER — OXYCODONE HCL 5 MG PO TABS
5.0000 mg | ORAL_TABLET | ORAL | Status: DC | PRN
  Administered 2020-11-13 – 2020-11-14 (×3): 10 mg via ORAL
  Filled 2020-11-12 (×3): qty 2

## 2020-11-12 MED ORDER — METHOCARBAMOL 500 MG PO TABS
1000.0000 mg | ORAL_TABLET | Freq: Three times a day (TID) | ORAL | Status: DC
  Administered 2020-11-13 – 2020-11-14 (×6): 1000 mg via ORAL
  Filled 2020-11-12 (×6): qty 2

## 2020-11-12 MED ORDER — MORPHINE SULFATE (PF) 2 MG/ML IV SOLN
2.0000 mg | INTRAVENOUS | Status: DC | PRN
  Administered 2020-11-13 – 2020-11-14 (×6): 2 mg via INTRAVENOUS
  Filled 2020-11-12 (×6): qty 1

## 2020-11-12 MED ORDER — ONDANSETRON HCL 4 MG/2ML IJ SOLN: 4.0000 mg | Freq: Once | INTRAMUSCULAR | Status: DC

## 2020-11-12 MED ORDER — LACTATED RINGERS IV BOLUS
500.0000 mL | Freq: Once | INTRAVENOUS | Status: AC
  Administered 2020-11-12: 500 mL via INTRAVENOUS

## 2020-11-12 MED ORDER — VANCOMYCIN 50 MG/ML ORAL SOLUTION
125.0000 mg | Freq: Three times a day (TID) | ORAL | Status: DC
  Administered 2020-11-13 – 2020-11-14 (×5): 125 mg via ORAL
  Filled 2020-11-12 (×7): qty 2.5

## 2020-11-12 MED ORDER — ONDANSETRON HCL 4 MG/2ML IJ SOLN: 4.0000 mg | Freq: Four times a day (QID) | INTRAMUSCULAR | Status: DC | PRN

## 2020-11-12 MED ORDER — RIVAROXABAN 10 MG PO TABS
10.0000 mg | ORAL_TABLET | Freq: Every day | ORAL | Status: DC
Start: 2020-11-13 — End: 2020-11-14
  Administered 2020-11-13 – 2020-11-14 (×2): 10 mg via ORAL
  Filled 2020-11-12 (×2): qty 1

## 2020-11-12 MED ORDER — ONDANSETRON 4 MG PO TBDP: 4.0000 mg | ORAL_TABLET | Freq: Four times a day (QID) | ORAL | Status: DC | PRN

## 2020-11-12 NOTE — ED Provider Notes (Signed)
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 musculature of the left piriformis and operator internus which could reflect some degree of muscle strain or minimal intramuscular hematoma, not significantly increased from priors. 4. Some focal soft tissue thickening in the lower inner quadrant of the right breast, nonspecific, could consider further evaluation with outpatient breast evaluation. Electronically Signed   By: Kreg Shropshire M.D.   On: 11/12/2020 19:22    Procedures Procedures   Medications Ordered in ED Medications  HYDROmorphone (DILAUDID) injection 1 mg (1 mg Intravenous Given 11/12/20 1812)  ondansetron (ZOFRAN) injection 4 mg (has no administration in time range)  lactated ringers bolus 500 mL (0 mLs Intravenous Stopped 11/12/20 1958)  iohexol (OMNIPAQUE) 350 MG/ML injection 100 mL (100 mLs Intravenous Contrast Given 11/12/20 1846)    ED Course  I have reviewed the triage vital signs and the nursing notes.  Pertinent labs & imaging results that were available during my care of the patient were reviewed by me and considered in my medical decision making (see chart for details).    MDM  Rules/Calculators/A&P                          Consult: Trauma MD  Patient presents as outlined.  She had a syncopal episode of unclear etiology.  Patient describes some thoracic back pain just preceding the episode.  CT PE study is ruled out significant pulmonary embolus or other significant complications since patient's initial injury and surgery.  Patient still dealing with significant amounts of pain.  She is also experiencing an  atypical numbness that seems to come and go on the right side of her body.  Patient does not have any ongoing confusion or residual significant headache or visual complications.  CT head obtained without any acute interval change or worsening.  Patient however has developed significant leukocytosis of unclear etiology at this time.  With several variable symptomatic complaints plus syncope versus possible seizure, trauma team consulted.  Patient will be observed overnight for recheck and further diagnostic testing as needed. Final Clinical Impression(s) / ED Diagnoses Final diagnoses:  Seizure-like activity (HCC)  Syncope, unspecified syncope type  H/O multiple trauma    Rx / DC Orders ED Discharge Orders    None       Arby Barrette, MD 11/12/20 2230

## 2020-11-12 NOTE — ED Notes (Signed)
Patient transported to CT 
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
## 2020-11-13 ENCOUNTER — Observation Stay (HOSPITAL_COMMUNITY): Payer: Commercial Managed Care - PPO

## 2020-11-13 ENCOUNTER — Other Ambulatory Visit: Payer: Self-pay

## 2020-11-13 ENCOUNTER — Observation Stay (HOSPITAL_BASED_OUTPATIENT_CLINIC_OR_DEPARTMENT_OTHER): Payer: Commercial Managed Care - PPO

## 2020-11-13 ENCOUNTER — Encounter (HOSPITAL_COMMUNITY): Payer: Self-pay

## 2020-11-13 DIAGNOSIS — R55 Syncope and collapse: Secondary | ICD-10-CM | POA: Diagnosis not present

## 2020-11-13 DIAGNOSIS — M79604 Pain in right leg: Secondary | ICD-10-CM

## 2020-11-13 DIAGNOSIS — R569 Unspecified convulsions: Secondary | ICD-10-CM

## 2020-11-13 LAB — BASIC METABOLIC PANEL
Anion gap: 11 (ref 5–15)
BUN: 10 mg/dL (ref 6–20)
CO2: 24 mmol/L (ref 22–32)
Calcium: 8.6 mg/dL — ABNORMAL LOW (ref 8.9–10.3)
Chloride: 101 mmol/L (ref 98–111)
Creatinine, Ser: 0.52 mg/dL (ref 0.44–1.00)
GFR, Estimated: >60 mL/min (ref >60)
Glucose, Bld: 103 mg/dL — ABNORMAL HIGH (ref 70–99)
Potassium: 4.1 mmol/L (ref 3.5–5.1)
Sodium: 136 mmol/L (ref 135–145)

## 2020-11-13 LAB — CBC
HCT: 29.7 % — ABNORMAL LOW (ref 36.0–46.0)
Hemoglobin: 9.4 g/dL — ABNORMAL LOW (ref 12.0–15.0)
MCH: 28.8 pg (ref 26.0–34.0)
MCHC: 31.6 g/dL (ref 30.0–36.0)
MCV: 91.1 fL (ref 80.0–100.0)
Platelets: 414 10*3/uL — ABNORMAL HIGH (ref 150–400)
RBC: 3.26 MIL/uL — ABNORMAL LOW (ref 3.87–5.11)
RDW: 14.9 % (ref 11.5–15.5)
WBC: 15.7 10*3/uL — ABNORMAL HIGH (ref 4.0–10.5)
nRBC: 0.4 % — ABNORMAL HIGH (ref 0.0–0.2)

## 2020-11-13 LAB — MAGNESIUM: Magnesium: 1.9 mg/dL (ref 1.7–2.4)
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
## 2020-11-13 IMAGING — MR MR HEAD W/O CM
7 of 13 series · 24 of 48 positions shown · non-contrast
Comparison: Head CT yesterday.  Head CT [DATE].

CLINICAL DATA: Seizure.  Abnormal neurological exam.

EXAM:
MRI HEAD WITHOUT CONTRAST
TECHNIQUE: Multiplanar, multiecho pulse sequences of the brain and surrounding
structures were obtained without intravenous contrast.

[Series 4: DWI · axial · 3.0mm · 0.94mm/px · z∈[-149,-11]mm · 6 of 100 slices shown (1 of 2)]
[im 1/100]
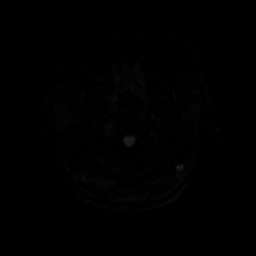
[im 20/100]
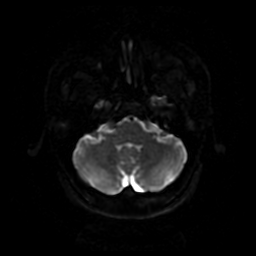
[im 40/100]
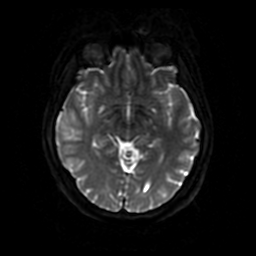
[im 60/100]
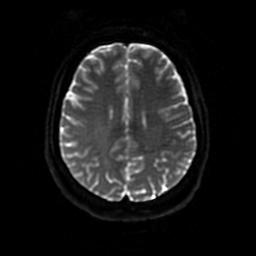
[im 80/100]
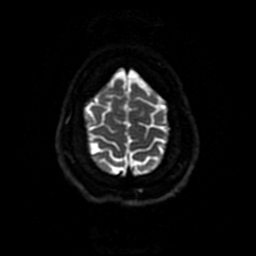
[im 100/100]
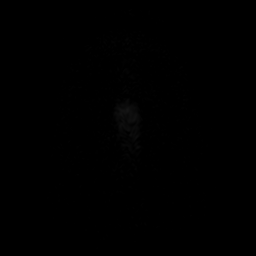

[Series 5: DWI · coronal · 4.0mm · 0.94mm/px · 5 of 74 slices shown (2 of 2)]
[im 1/74]
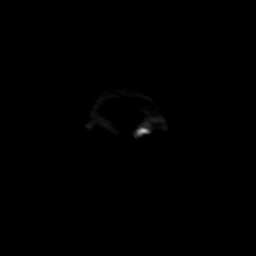
[im 19/74]
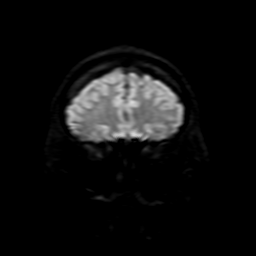
[im 37/74]
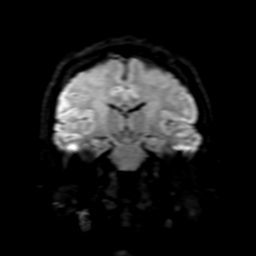
[im 55/74]
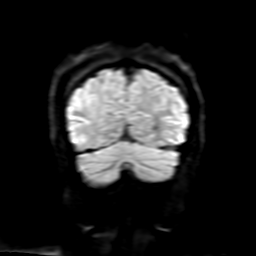
[im 74/74]
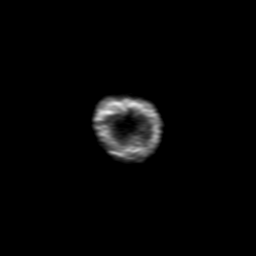

[Series 7: FLAIR · sagittal · 5.0mm · 0.23mm/px · 2 of 26 slices shown (1 of 3)]
[im 1/26]
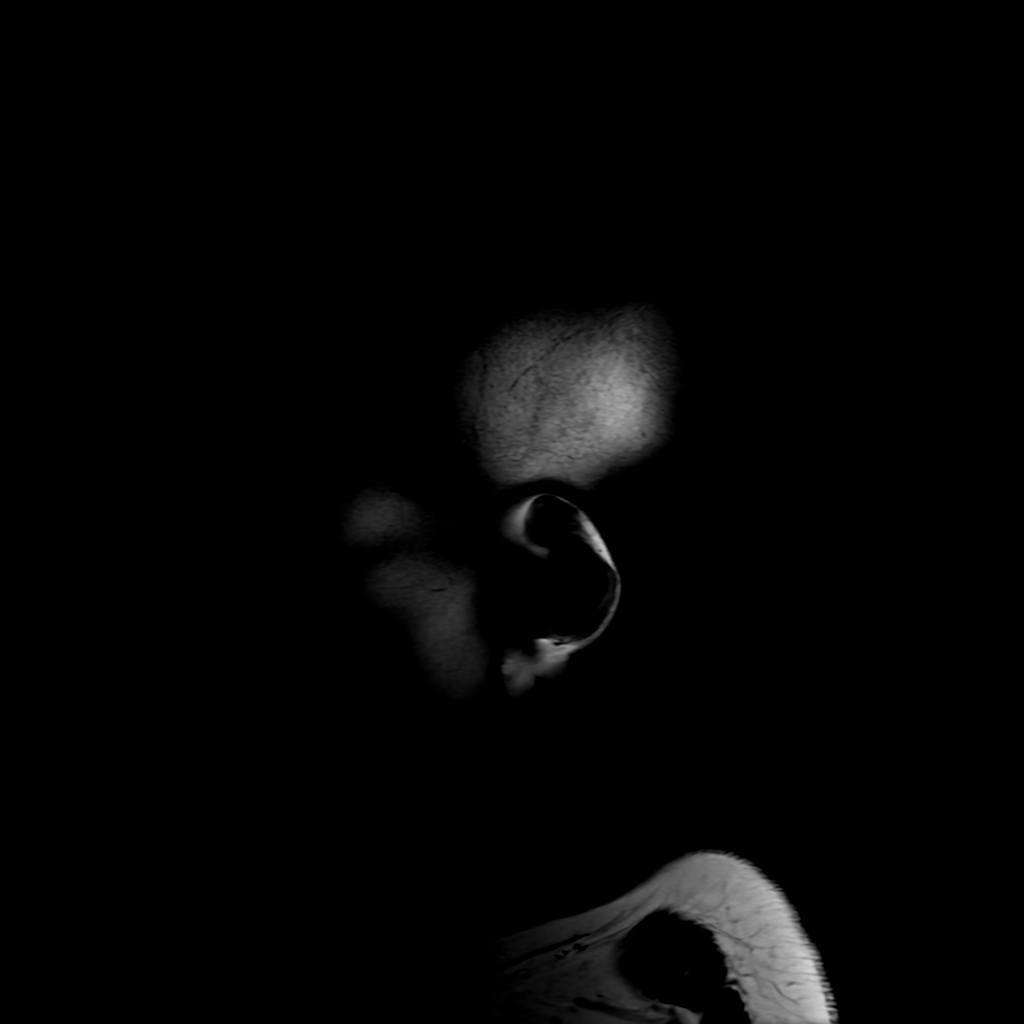
[im 26/26]
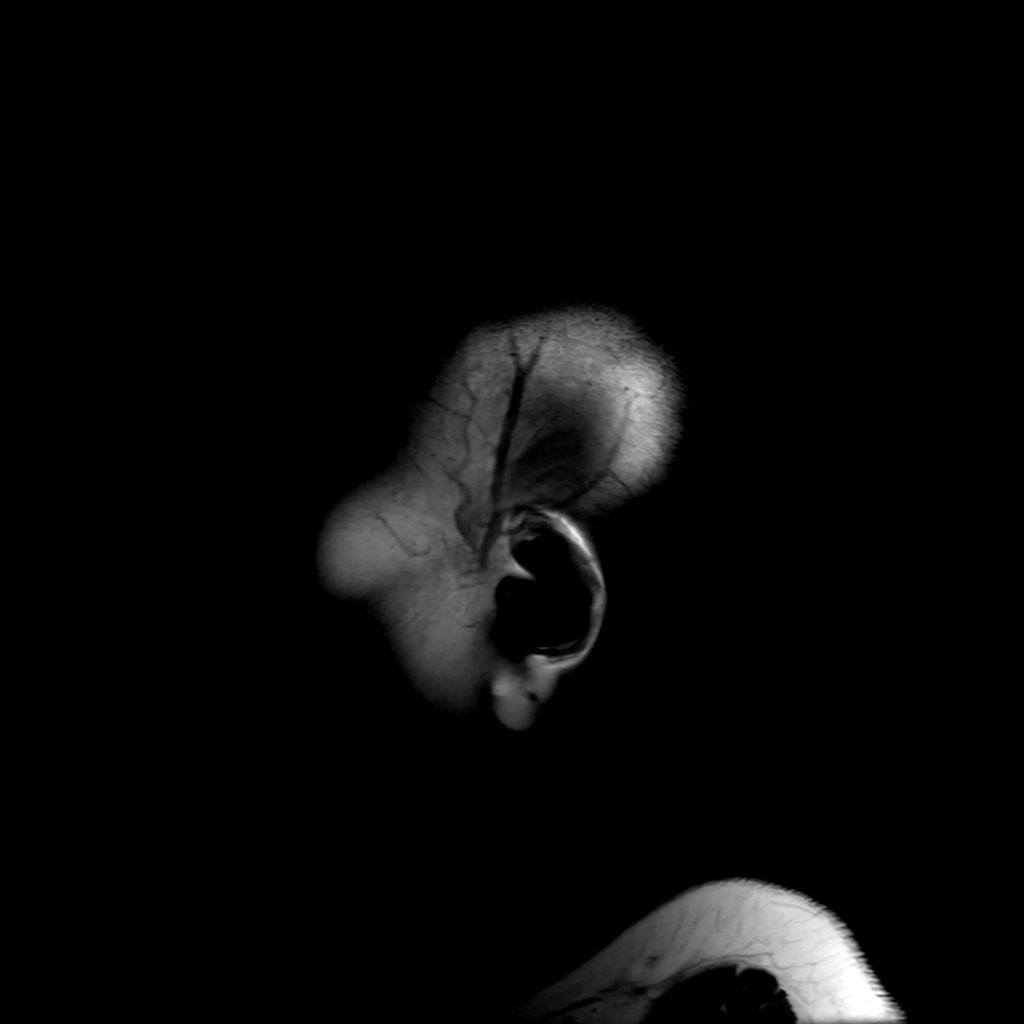

[Series 9: FLAIR · axial · 3.0mm · 0.45mm/px · z∈[-146,-10]mm · 2 of 25 slices shown (2 of 3)]
[im 1/25]
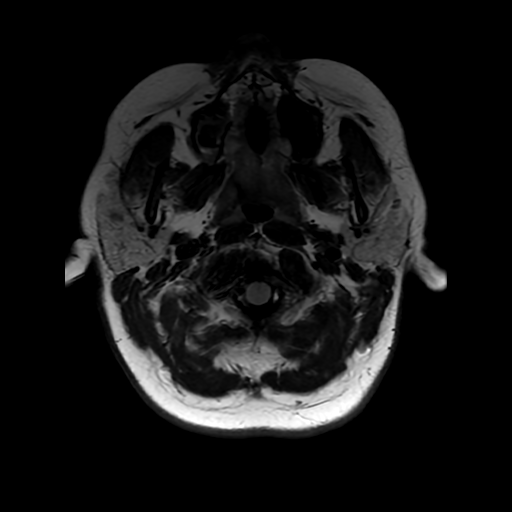
[im 25/25]
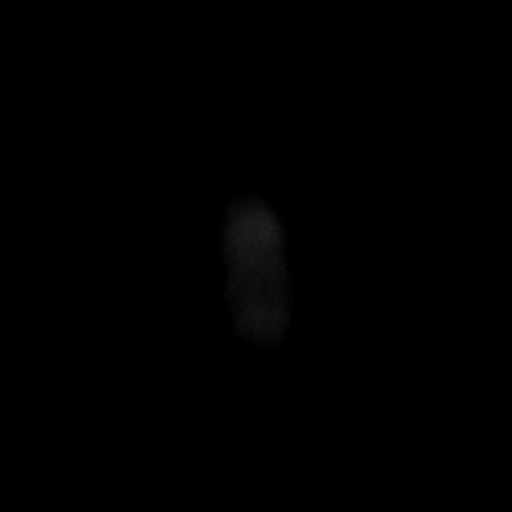

[Series 13: FLAIR · coronal · 3.0mm · 0.35mm/px · 2 of 28 slices shown (3 of 3)]
[im 1/28]
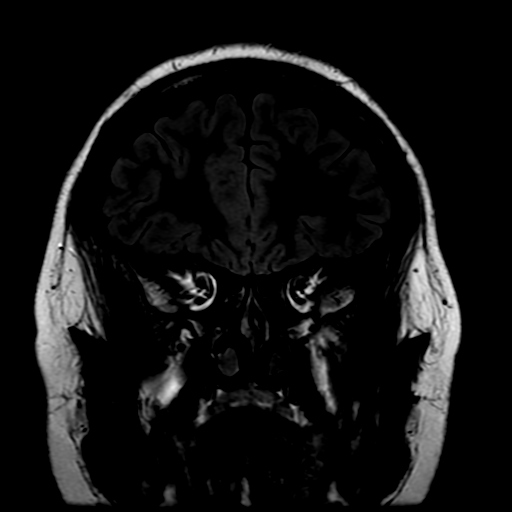
[im 28/28]
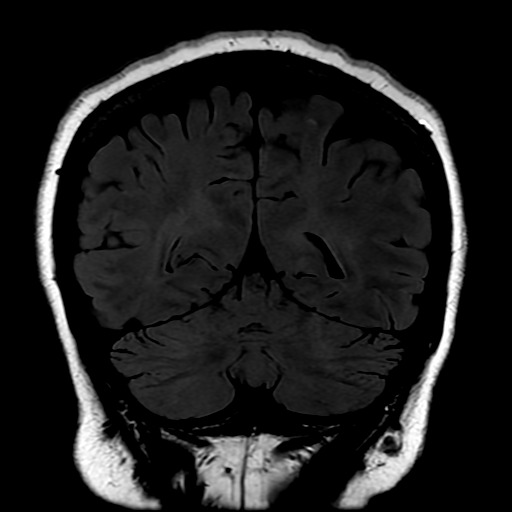

[Series 450: ADC · axial · 3.0mm · 0.94mm/px · z∈[-149,-11]mm · 4 of 50 slices shown (1 of 2)]
[im 1/50]
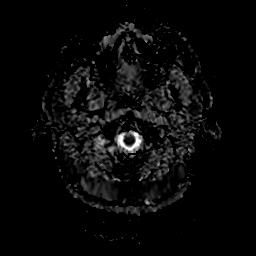
[im 17/50]
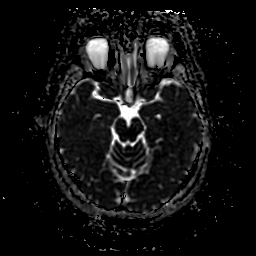
[im 33/50]
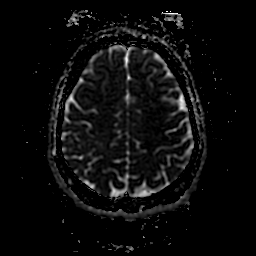
[im 50/50]
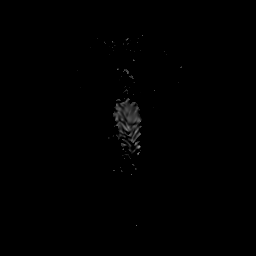

[Series 550: ADC · coronal · 4.0mm · 0.94mm/px · 3 of 37 slices shown (2 of 2)]
[im 1/37]
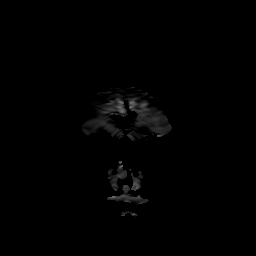
[im 19/37]
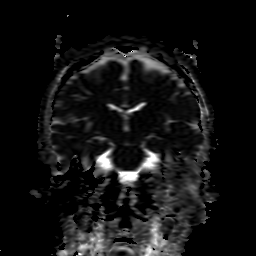
[im 37/37]
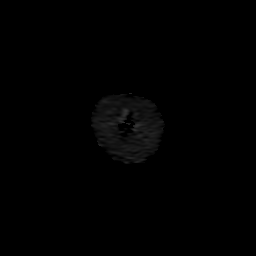

[24 of 48 positions shown; findings below may reference images not displayed]

FINDINGS: Brain: The brain has a normal appearance without evidence of
malformation, atrophy, old or acute small or large vessel
infarction, mass lesion, hemorrhage, hydrocephalus or extra-axial
collection. Mesial temporal lobes appear symmetric and normal.

Vascular: Major vessels at the base of the brain show flow. Venous
sinuses appear patent.

Skull and upper cervical spine: Normal.

Sinuses/Orbits: Clear/normal.

Other: None significant.
IMPRESSION: Normal head CT. No abnormality seen to explain seizure.
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
## 2020-11-13 MED ORDER — CALCIUM CARBONATE ANTACID 500 MG PO CHEW
400.0000 mg | CHEWABLE_TABLET | Freq: Two times a day (BID) | ORAL | Status: DC
  Administered 2020-11-13 – 2020-11-14 (×3): 400 mg via ORAL
  Filled 2020-11-13 (×3): qty 2

## 2020-11-13 MED ORDER — HYDROXYZINE HCL 25 MG PO TABS
25.0000 mg | ORAL_TABLET | Freq: Three times a day (TID) | ORAL | Status: DC | PRN
  Administered 2020-11-13: 25 mg via ORAL
  Administered 2020-11-14: 50 mg via ORAL
  Filled 2020-11-13: qty 2
  Filled 2020-11-13: qty 1
  Filled 2020-11-13: qty 2

## 2020-11-13 MED ORDER — SERTRALINE HCL 50 MG PO TABS
50.0000 mg | ORAL_TABLET | Freq: Every day | ORAL | Status: DC
  Administered 2020-11-13 – 2020-11-14 (×2): 50 mg via ORAL
  Filled 2020-11-13 (×2): qty 1

## 2020-11-13 MED ORDER — PREGABALIN 75 MG PO CAPS
75.0000 mg | ORAL_CAPSULE | Freq: Two times a day (BID) | ORAL | Status: DC
  Administered 2020-11-13 – 2020-11-14 (×3): 75 mg via ORAL
  Filled 2020-11-13 (×3): qty 1

## 2020-11-13 NOTE — Procedures (Signed)
Patient Name: Madison Cook  MRN: [id number]  Epilepsy Attending: Charlsie Quest  Referring Physician/Provider: Trixie Deis, PA Date: 11/13/2020  Duration: 24.48 mins  Patient history: 25 year old female presented after likely vasovagal syncope episode and one seizure episode at home. EEG to evaluate for seizure.   Level of alertness: Awake, asleep  AEDs during EEG study: [place]  Technical aspects: This EEG study was done with scalp electrodes positioned according to the 10-20 International system of electrode placement. Electrical activity was acquired at a sampling rate of 500Hz  and reviewed with a high frequency filter of 70Hz  and a low frequency filter of 1Hz . EEG data were recorded continuously and digitally stored.   Description: The posterior dominant rhythm consists of 9-10 Hz activity of moderate voltage (25-35 uV) seen predominantly in posterior head regions, symmetric and reactive to eye opening and eye closing. Sleep was characterized by vertex waves, sleep spindles (12 to 14 Hz), maximal frontocentral region. Physiologic photic driving was seen during photic stimulation. Hyperventilation was not performed.     IMPRESSION: This study is within normal limits. No seizures or epileptiform discharges were seen throughout the recording.  Any Mcneice 

## 2020-11-13 NOTE — Progress Notes (Signed)
EEG completed, results pending. 
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
## 2020-11-13 NOTE — Progress Notes (Signed)
Pt became anxious when told about visitation policy, told them that her mother can not stay over for tonight, pt said that she will go home too if her mom will go home, called dr. Dwain Sarna to inform and said that it is ok  for pt's mother to stay, talked to charge nurse about it.
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
## 2020-11-13 NOTE — ED Notes (Signed)
Lunch Tray Ordered @ 1030. 
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
## 2020-11-13 NOTE — Progress Notes (Signed)
Right lower extremity venous study completed.     Please see CV Proc for preliminary results.   Briawna Carver, RVT  

## 2020-11-13 NOTE — ED Notes (Signed)
1st lactic acid normal. 2nd discontinued.

## 2020-11-13 NOTE — ED Notes (Signed)
Pt reports numbness/ tingling in right leg especially during touch this RN notified MD

## 2020-11-14 DIAGNOSIS — R55 Syncope and collapse: Secondary | ICD-10-CM | POA: Diagnosis not present

## 2020-11-14 NOTE — Progress Notes (Signed)
Sutures removed per MD order. Unable to remove one suture due to it being very tight and could not reach to cut. Patient instructed to let her MD know on her next follow-up visit. Patient verbalizes understanding.

## 2020-11-14 NOTE — TOC Transition Note (Signed)
Transition of Care [facility]) - CM/SW Discharge Note   Patient Details  Name: Madison Cook MRN: [id number] Date of Birth: 05-16-1996  [facility] CM/SW Contact:  Kallie Locks, RN Phone Number: [phone] 11/14/2020, 10:08 AM   Clinical Narrative:   Made aware by nursing patient will need [facility] transportation. Spoke with patient to confirm address. Patient endorses that she has [facility] and DME from previous hospitalization.   Confirmed with Kandee Keen with [facility] that he is aware patient to transition back home today.  Called [facility] at 10am and placed form in shadow chart. Nursing aware.   No further needs assessed.    Final next level of care: Home w Home Health Services Barriers to Discharge: No Barriers Identified   Patient Goals and CMS Choice Patient states their goals for this hospitalization and ongoing recovery are:: to return home CMS Medicare.gov Compare Post Acute Care list provided to:: Patient Choice offered to / list presented to : Patient  Discharge Placement                       Discharge Plan and Services                  DME Agency: NA       HH Arranged: NA HH Agency: [facility] Date [facility] Agency Contacted: 11/14/20 Time HH Agency Contacted: 1000 Representative spoke with at [facility] Agency: Kandee Keen  Social Determinants of Health (SDOH) Interventions     Readmission Risk Interventions Readmission Risk Prevention Plan 11/09/2020  Transportation Screening Complete  PCP or Specialist Appt within 5-7 Days Complete  Home Care Screening Complete  Medication Review (RN CM) Complete  Some recent data might be hidden
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
## 2020-11-14 NOTE — Progress Notes (Signed)
Madison Cook to be D/C'd per MD order. Discussed with the patient and all questions fully answered. ? VSS, Skin clean, dry and intact without evidence of skin break down, no evidence of skin tears noted.  ? IV catheter discontinued intact. Site without signs and symptoms of complications. Dressing and pressure applied. ? An After Visit Summary was printed and given to the patient. Patient informed where to pickup prescriptions. ? D/C education completed with patient/family including follow up instructions, medication list, d/c activities limitations if indicated, with other d/c instructions as indicated by MD - patient able to verbalize understanding, all questions fully answered.  ? Patient instructed to return to ED, call 911, or call MD for any changes in condition.  ? Patient to be escorted via stretcher, and D/C home via [facility].
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
## 2020-11-17 ENCOUNTER — Telehealth (HOSPITAL_COMMUNITY): Payer: Self-pay | Admitting: Pharmacist

## 2020-11-17 NOTE — Telephone Encounter (Signed)
Transitions of Care Pharmacy  ° °Call attempted for a pharmacy transitions of care follow-up. HIPAA appropriate voicemail was left with call back information provided.  ° °Call attempt #1. Will follow-up in 2-3 days.  °  °

## 2020-11-18 ENCOUNTER — Ambulatory Visit: Payer: Commercial Managed Care - PPO | Admitting: Orthopedic Surgery

## 2020-11-19 ENCOUNTER — Telehealth (HOSPITAL_COMMUNITY): Payer: Self-pay | Admitting: Pharmacist
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
## 2020-11-23 ENCOUNTER — Ambulatory Visit (INDEPENDENT_AMBULATORY_CARE_PROVIDER_SITE_OTHER): Payer: Commercial Managed Care - PPO | Admitting: Orthopedic Surgery

## 2020-11-23 ENCOUNTER — Other Ambulatory Visit: Payer: Self-pay

## 2020-11-23 DIAGNOSIS — S83511D Sprain of anterior cruciate ligament of right knee, subsequent encounter: Secondary | ICD-10-CM | POA: Diagnosis not present

## 2020-11-24 ENCOUNTER — Encounter: Payer: Self-pay | Admitting: Orthopedic Surgery
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
## 2020-12-14 ENCOUNTER — Ambulatory Visit: Payer: Commercial Managed Care - PPO | Admitting: Orthopedic Surgery

## 2020-12-22 ENCOUNTER — Ambulatory Visit: Payer: Self-pay | Admitting: *Deleted

## 2020-12-22 NOTE — Telephone Encounter (Signed)
Summary: Call back request    Pt's sister called and has questions regarding patient's med list and whether she can safely take ibuprofen please advise  Best contact: [phone]      Called patient's sister and reviewed that she is not on DPR for patient and unable to review patient's medications. Patient's sister verbalized understanding. Instructed patient's sister to contact patient's pharmacist to review any other safety interactions for taking ibuprofen. Care advise given. Patient's sister verbalized understanding of care advise and to call back if needed.   Reason for Disposition . [1] Caller has medicine question about med NOT prescribed by PCP AND [2] triager unable to answer question (e.g., compatibility with other med, storage)  Answer Assessment - Initial Assessment Questions 1. NAME of MEDICATION: "What medicine are you calling about?"     ibuprofen 2. QUESTION: "What is your question?" (e.g., medication refill, side effect)     Is it safe to take ibuprofen with her other medications 3. PRESCRIBING HCP: "Who prescribed it?" Reason: if prescribed by specialist, call should be referred to that group.     na 4. SYMPTOMS: "Do you have any symptoms?"     na 5. SEVERITY: If symptoms are present, ask "Are they mild, moderate or severe?"     na 6. PREGNANCY:  "Is there any chance that you are pregnant?" "When was your last menstrual period?"     na  Protocols used: MEDICATION QUESTION CALL-A-AH

## 2020-12-28 ENCOUNTER — Other Ambulatory Visit: Payer: Self-pay

## 2020-12-28 ENCOUNTER — Ambulatory Visit (INDEPENDENT_AMBULATORY_CARE_PROVIDER_SITE_OTHER): Payer: Commercial Managed Care - PPO | Admitting: Orthopedic Surgery

## 2020-12-28 DIAGNOSIS — S83511D Sprain of anterior cruciate ligament of right knee, subsequent encounter: Secondary | ICD-10-CM

## 2020-12-29 ENCOUNTER — Encounter: Payer: Self-pay | Admitting: Orthopedic Surgery
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
## 2021-10-18 ENCOUNTER — Encounter: Payer: Self-pay | Admitting: Family Medicine

## 2021-10-18 ENCOUNTER — Other Ambulatory Visit: Payer: Self-pay

## 2021-10-18 ENCOUNTER — Ambulatory Visit (INDEPENDENT_AMBULATORY_CARE_PROVIDER_SITE_OTHER): Payer: Commercial Managed Care - PPO | Admitting: Family Medicine

## 2021-10-18 VITALS — BP 148/95 | HR 102 | Ht 63.0 in | Wt 230.0 lb

## 2021-10-18 DIAGNOSIS — Z3169 Encounter for other general counseling and advice on procreation: Secondary | ICD-10-CM | POA: Diagnosis not present
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
## 2021-10-25 ENCOUNTER — Encounter: Payer: Self-pay | Admitting: Family Medicine
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
## 2022-09-30 ENCOUNTER — Ambulatory Visit (INDEPENDENT_AMBULATORY_CARE_PROVIDER_SITE_OTHER): Payer: Commercial Managed Care - HMO

## 2022-09-30 ENCOUNTER — Ambulatory Visit (INDEPENDENT_AMBULATORY_CARE_PROVIDER_SITE_OTHER): Payer: Medicaid Other | Admitting: *Deleted

## 2022-09-30 VITALS — BP 133/89 | HR 92 | Wt 234.0 lb

## 2022-09-30 DIAGNOSIS — Z3A01 Less than 8 weeks gestation of pregnancy: Secondary | ICD-10-CM

## 2022-09-30 DIAGNOSIS — O3680X0 Pregnancy with inconclusive fetal viability, not applicable or unspecified: Secondary | ICD-10-CM | POA: Diagnosis not present

## 2022-09-30 DIAGNOSIS — O0990 Supervision of high risk pregnancy, unspecified, unspecified trimester: Secondary | ICD-10-CM | POA: Insufficient documentation

## 2022-09-30 DIAGNOSIS — O34219 Maternal care for unspecified type scar from previous cesarean delivery: Secondary | ICD-10-CM | POA: Insufficient documentation

## 2022-09-30 NOTE — Progress Notes (Signed)
New OB Intake  I explained I am completing New OB Intake today.  Pt is G4/P2. I reviewed her allergies, medications, Medical/Surgical/OB history, and appropriate screenings. Pt unsure about LMP, had a cycle on 07/27/22, and then some spotting on 08/22/22. Scan today showed an early pregnancy that correlates with Nov cycle.  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     Placed OB Box on problem list and updated   Patient informed that the ultrasound is considered a limited obstetric ultrasound and is not intended to be a complete ultrasound exam.  Patient also informed that the ultrasound is not being completed with the intent of assessing for fetal or placental anomalies or any pelvic abnormalities. Explained that the purpose of today's ultrasound is to assess for dating and fetal heart rate.  Patient acknowledges the purpose of the exam and the limitations of the study.       Today's scan showed gestational sac and yolk sac, possible early fetal pole. Will have patient return in 2 weeks to repeat scan  Scheryl Marten, RN 09/30/2022  11:21 AM

## 2022-10-06 ENCOUNTER — Other Ambulatory Visit: Payer: Self-pay | Admitting: *Deleted

## 2022-10-06 MED ORDER — PRENATAL 28-0.8 MG PO TABS: 1.0000 | ORAL_TABLET | Freq: Every day | ORAL | 12 refills | Status: DC

## 2022-10-07 ENCOUNTER — Other Ambulatory Visit: Payer: Self-pay | Admitting: *Deleted

## 2022-10-07 MED ORDER — PRENATAL 28-0.8 MG PO TABS: 1.0000 | ORAL_TABLET | Freq: Every day | ORAL | 12 refills | Status: AC

## 2022-10-07 NOTE — Progress Notes (Signed)
Sent to incorrect pharmacy

## 2022-10-14 ENCOUNTER — Ambulatory Visit (INDEPENDENT_AMBULATORY_CARE_PROVIDER_SITE_OTHER): Payer: Medicaid Other | Admitting: *Deleted

## 2022-10-14 ENCOUNTER — Ambulatory Visit (INDEPENDENT_AMBULATORY_CARE_PROVIDER_SITE_OTHER): Payer: Medicaid Other

## 2022-10-14 VITALS — BP 137/87 | HR 87

## 2022-10-14 DIAGNOSIS — Z3A01 Less than 8 weeks gestation of pregnancy: Secondary | ICD-10-CM

## 2022-10-14 DIAGNOSIS — Z3481 Encounter for supervision of other normal pregnancy, first trimester: Secondary | ICD-10-CM

## 2022-10-14 DIAGNOSIS — O0990 Supervision of high risk pregnancy, unspecified, unspecified trimester: Secondary | ICD-10-CM

## 2022-10-14 DIAGNOSIS — O3680X0 Pregnancy with inconclusive fetal viability, not applicable or unspecified: Secondary | ICD-10-CM

## 2022-10-14 NOTE — Progress Notes (Signed)
Pt here for repeat scan. Denies any vaginal bleeding but is having some cramping.   Patient informed that the ultrasound is considered a limited obstetric ultrasound and is not intended to be a complete ultrasound exam.  Patient also informed that the ultrasound is not being completed with the intent of assessing for fetal or placental anomalies or any pelvic abnormalities. Explained that the purpose of today's ultrasound is to assess for fetal heart rate.  Patient acknowledges the purpose of the exam and the limitations of the study.      Scan today showed single living IUP.    Pt to return in about 4 weeks for her New Ob visit with provider.   Crosby Oyster, RN

## 2022-10-14 NOTE — Progress Notes (Signed)
Attestation of Attending Supervision of clinical support staff: I agree with the care provided to this patient and was available for any consultation.  I have reviewed the RN's note and chart. I was available for consult and to see the patient if needed.   Valdez Brannan MD MPH Attending Physician Faculty Practice- [facility]  

## 2022-10-20 ENCOUNTER — Inpatient Hospital Stay (HOSPITAL_COMMUNITY)
Admission: AD | Admit: 2022-10-20 | Discharge: 2022-10-20 | Disposition: A | Discharge status: Home / Self Care | Payer: Medicaid Other | Attending: Obstetrics & Gynecology | Admitting: Obstetrics & Gynecology

## 2022-10-20 ENCOUNTER — Telehealth: Payer: Self-pay

## 2022-10-20 ENCOUNTER — Other Ambulatory Visit: Payer: Self-pay

## 2022-10-20 ENCOUNTER — Encounter (HOSPITAL_COMMUNITY): Payer: Self-pay | Admitting: Obstetrics & Gynecology

## 2022-10-20 ENCOUNTER — Inpatient Hospital Stay (HOSPITAL_COMMUNITY): Payer: Medicaid Other

## 2022-10-20 ENCOUNTER — Inpatient Hospital Stay (EMERGENCY_DEPARTMENT_HOSPITAL)
Admission: AD | Admit: 2022-10-20 | Discharge: 2022-10-20 | Disposition: A | Discharge status: Home / Self Care | Payer: Medicaid Other | Source: Home / Self Care | Attending: Obstetrics & Gynecology | Admitting: Obstetrics & Gynecology

## 2022-10-20 DIAGNOSIS — K8020 Calculus of gallbladder without cholecystitis without obstruction: Secondary | ICD-10-CM | POA: Insufficient documentation

## 2022-10-20 DIAGNOSIS — R109 Unspecified abdominal pain: Secondary | ICD-10-CM | POA: Insufficient documentation

## 2022-10-20 DIAGNOSIS — O0990 Supervision of high risk pregnancy, unspecified, unspecified trimester: Secondary | ICD-10-CM

## 2022-10-20 DIAGNOSIS — N132 Hydronephrosis with renal and ureteral calculous obstruction: Secondary | ICD-10-CM | POA: Insufficient documentation

## 2022-10-20 DIAGNOSIS — O99611 Diseases of the digestive system complicating pregnancy, first trimester: Secondary | ICD-10-CM | POA: Insufficient documentation

## 2022-10-20 DIAGNOSIS — D72829 Elevated white blood cell count, unspecified: Secondary | ICD-10-CM | POA: Insufficient documentation

## 2022-10-20 DIAGNOSIS — Z3A01 Less than 8 weeks gestation of pregnancy: Secondary | ICD-10-CM

## 2022-10-20 DIAGNOSIS — O26831 Pregnancy related renal disease, first trimester: Secondary | ICD-10-CM | POA: Insufficient documentation

## 2022-10-20 DIAGNOSIS — R7989 Other specified abnormal findings of blood chemistry: Secondary | ICD-10-CM | POA: Diagnosis not present

## 2022-10-20 DIAGNOSIS — N1339 Other hydronephrosis: Secondary | ICD-10-CM

## 2022-10-20 DIAGNOSIS — O26891 Other specified pregnancy related conditions, first trimester: Secondary | ICD-10-CM | POA: Insufficient documentation

## 2022-10-20 DIAGNOSIS — O26611 Liver and biliary tract disorders in pregnancy, first trimester: Secondary | ICD-10-CM | POA: Insufficient documentation

## 2022-10-20 DIAGNOSIS — O99111 Other diseases of the blood and blood-forming organs and certain disorders involving the immune mechanism complicating pregnancy, first trimester: Secondary | ICD-10-CM | POA: Diagnosis not present

## 2022-10-20 DIAGNOSIS — O0991 Supervision of high risk pregnancy, unspecified, first trimester: Secondary | ICD-10-CM | POA: Insufficient documentation

## 2022-10-20 DIAGNOSIS — N200 Calculus of kidney: Secondary | ICD-10-CM | POA: Insufficient documentation

## 2022-10-20 HISTORY — DX: Other specified abnormal findings of blood chemistry: R79.89

## 2022-10-20 LAB — COMPREHENSIVE METABOLIC PANEL
ALT: 19 U/L (ref 0–44)
ALT: 21 U/L (ref 0–44)
AST: 17 U/L (ref 15–41)
AST: 23 U/L (ref 15–41)
Albumin: 3.6 g/dL (ref 3.5–5.0)
Albumin: 3.5 g/dL (ref 3.5–5.0)
Alkaline Phosphatase: 47 U/L (ref 38–126)
Alkaline Phosphatase: 44 U/L (ref 38–126)
Anion gap: 9 (ref 5–15)
Anion gap: 11 (ref 5–15)
BUN: 13 mg/dL (ref 6–20)
BUN: 11 mg/dL (ref 6–20)
CO2: 22 mmol/L (ref 22–32)
CO2: 21 mmol/L — ABNORMAL LOW (ref 22–32)
Calcium: 8.9 mg/dL (ref 8.9–10.3)
Calcium: 8.9 mg/dL (ref 8.9–10.3)
Chloride: 101 mmol/L (ref 98–111)
Chloride: 103 mmol/L (ref 98–111)
Creatinine, Ser: 1.05 mg/dL — ABNORMAL HIGH (ref 0.44–1.00)
Creatinine, Ser: 0.84 mg/dL (ref 0.44–1.00)
GFR, Estimated: >60 mL/min (ref >60)
GFR, Estimated: >60 mL/min (ref >60)
Glucose, Bld: 119 mg/dL — ABNORMAL HIGH (ref 70–99)
Glucose, Bld: 87 mg/dL (ref 70–99)
Potassium: 3.7 mmol/L (ref 3.5–5.1)
Potassium: 3.7 mmol/L (ref 3.5–5.1)
Sodium: 132 mmol/L — ABNORMAL LOW (ref 135–145)
Sodium: 135 mmol/L (ref 135–145)
Total Bilirubin: 0.3 mg/dL (ref 0.3–1.2)
Total Bilirubin: 0.3 mg/dL (ref 0.3–1.2)
Total Protein: 7 g/dL (ref 6.5–8.1)
Total Protein: 7.1 g/dL (ref 6.5–8.1)

## 2022-10-20 LAB — CBC WITH DIFFERENTIAL/PLATELET
Abs Immature Granulocytes: 0.08 10*3/uL — ABNORMAL HIGH (ref 0.00–0.07)
Abs Immature Granulocytes: 0.07 10*3/uL (ref 0.00–0.07)
Basophils Absolute: 0 10*3/uL (ref 0.0–0.1)
Basophils Absolute: 0 10*3/uL (ref 0.0–0.1)
Basophils Relative: 0 %
Basophils Relative: 0 %
Eosinophils Absolute: 0 10*3/uL (ref 0.0–0.5)
Eosinophils Absolute: 0.1 10*3/uL (ref 0.0–0.5)
Eosinophils Relative: 0 %
Eosinophils Relative: 0 %
HCT: 39.2 % (ref 36.0–46.0)
HCT: 39.2 % (ref 36.0–46.0)
Hemoglobin: 13.1 g/dL (ref 12.0–15.0)
Hemoglobin: 12.6 g/dL (ref 12.0–15.0)
Immature Granulocytes: 1 %
Immature Granulocytes: 0 %
Lymphocytes Relative: 14 %
Lymphocytes Relative: 16 %
Lymphs Abs: 2.4 10*3/uL (ref 0.7–4.0)
Lymphs Abs: 2.7 10*3/uL (ref 0.7–4.0)
MCH: 29.9 pg (ref 26.0–34.0)
MCH: 29.8 pg (ref 26.0–34.0)
MCHC: 33.4 g/dL (ref 30.0–36.0)
MCHC: 32.1 g/dL (ref 30.0–36.0)
MCV: 89.5 fL (ref 80.0–100.0)
MCV: 92.7 fL (ref 80.0–100.0)
Monocytes Absolute: 0.8 10*3/uL (ref 0.1–1.0)
Monocytes Absolute: 0.9 10*3/uL (ref 0.1–1.0)
Monocytes Relative: 5 %
Monocytes Relative: 5 %
Neutro Abs: 14 10*3/uL — ABNORMAL HIGH (ref 1.7–7.7)
Neutro Abs: 13 10*3/uL — ABNORMAL HIGH (ref 1.7–7.7)
Neutrophils Relative %: 80 %
Neutrophils Relative %: 79 %
Platelets: 253 10*3/uL (ref 150–400)
Platelets: 254 10*3/uL (ref 150–400)
RBC: 4.38 MIL/uL (ref 3.87–5.11)
RBC: 4.23 MIL/uL (ref 3.87–5.11)
RDW: 13.2 % (ref 11.5–15.5)
RDW: 13.6 % (ref 11.5–15.5)
WBC: 17.3 10*3/uL — ABNORMAL HIGH (ref 4.0–10.5)
WBC: 16.8 10*3/uL — ABNORMAL HIGH (ref 4.0–10.5)
nRBC: 0 % (ref 0.0–0.2)
nRBC: 0 % (ref 0.0–0.2)

## 2022-10-20 LAB — URINALYSIS, ROUTINE W REFLEX MICROSCOPIC
Bacteria, UA: NONE SEEN
Bilirubin Urine: NEGATIVE
Bilirubin Urine: NEGATIVE
Glucose, UA: NEGATIVE mg/dL
Glucose, UA: NEGATIVE mg/dL
Ketones, ur: NEGATIVE mg/dL
Ketones, ur: NEGATIVE mg/dL
Leukocytes,Ua: NEGATIVE
Leukocytes,Ua: NEGATIVE
Nitrite: NEGATIVE
Nitrite: NEGATIVE
Protein, ur: 30 mg/dL — AB
Protein, ur: NEGATIVE mg/dL
RBC / HPF: >50 RBC/hpf — ABNORMAL HIGH (ref 0–5)
Specific Gravity, Urine: 1.013 (ref 1.005–1.030)
Specific Gravity, Urine: 1.012 (ref 1.005–1.030)
pH: 6 (ref 5.0–8.0)
pH: 5 (ref 5.0–8.0)

## 2022-10-20 MED ORDER — HYDROMORPHONE HCL 1 MG/ML IJ SOLN
1.0000 mg | Freq: Once | INTRAMUSCULAR | Status: AC
  Administered 2022-10-20: 1 mg via INTRAMUSCULAR
  Filled 2022-10-20: qty 1

## 2022-10-20 MED ORDER — SODIUM CHLORIDE 0.9 % IV SOLN
2.0000 g | Freq: Once | INTRAVENOUS | Status: AC
  Administered 2022-10-20: 2 g via INTRAVENOUS
  Filled 2022-10-20: qty 20

## 2022-10-20 MED ORDER — KETOROLAC TROMETHAMINE 30 MG/ML IJ SOLN
30.0000 mg | Freq: Once | INTRAMUSCULAR | Status: AC
  Administered 2022-10-20: 30 mg via INTRAVENOUS
  Filled 2022-10-20: qty 1

## 2022-10-20 MED ORDER — KETOROLAC TROMETHAMINE 10 MG PO TABS: 10.0000 mg | ORAL_TABLET | Freq: Three times a day (TID) | ORAL | 0 refills | Status: DC | PRN

## 2022-10-20 MED ORDER — SODIUM CHLORIDE 0.9 % IV SOLN: Freq: Once | INTRAVENOUS | Status: AC

## 2022-10-20 MED ORDER — TAMSULOSIN HCL 0.4 MG PO CAPS
0.4000 mg | ORAL_CAPSULE | Freq: Once | ORAL | Status: AC
  Administered 2022-10-20: 0.4 mg via ORAL
  Filled 2022-10-20: qty 1

## 2022-10-20 MED ORDER — KETOROLAC TROMETHAMINE 60 MG/2ML IM SOLN
60.0000 mg | Freq: Once | INTRAMUSCULAR | Status: AC
  Administered 2022-10-20: 60 mg via INTRAMUSCULAR
  Filled 2022-10-20: qty 2

## 2022-10-20 MED ORDER — SODIUM CHLORIDE 0.9 % IV SOLN
12.5000 mg | Freq: Once | INTRAVENOUS | Status: AC
  Administered 2022-10-20: 12.5 mg via INTRAVENOUS
  Filled 2022-10-20: qty 12.5

## 2022-10-20 MED ORDER — LACTATED RINGERS IV SOLN: Freq: Once | INTRAVENOUS | Status: AC

## 2022-10-20 MED ORDER — OXYCODONE-ACETAMINOPHEN 5-325 MG PO TABS: 1.0000 | ORAL_TABLET | Freq: Four times a day (QID) | ORAL | 0 refills | Status: DC | PRN
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
## 2022-10-20 NOTE — MAU Note (Signed)
.  Madison Cook is a 27 y.o. at [redacted]w[redacted]d here in MAU reporting: left flank pain that is severe that started around MN. Pt reports taking Tylenol 650mg  around MN with no relief, and took a warm bath. Pt denies dysuria, frequent urination, or blood in urine. Pt states just feels pressure. Pt denies VB or LOF.  Family hx of kidney stones Onset of complaint: 0000 Pain score: 10/10 Vitals:   10/20/22 0242  BP: (!) 134/96  Pulse: 87  Resp: 16  Temp: 98.1 F (36.7 C)  SpO2: 98%      Lab orders placed from triage:  ua

## 2022-10-20 NOTE — Telephone Encounter (Signed)
Left message to notify pt of Lab appt next week.

## 2022-10-20 NOTE — MAU Note (Signed)
.  Madison Cook is a 27 y.o. at [redacted]w[redacted]d here in MAU reporting L flank pain from known kidney stone. Pt was here last night with the same pain. Pt napped a lot today but once up and moving more about 1600 pain became worse and has progressed. Pain is sharp and constant like stabbing pain. No pregnancy complaints  Onset of complaint: 1600 Pain score: 10 Vitals:   10/20/22 2010 10/20/22 2012  BP:  (!) 137/91  Pulse: 92   Resp: 18   Temp: 98.5 F (36.9 C)   SpO2: 100%      FHT:n/a Lab orders placed from triage:u/a

## 2022-10-21 LAB — CULTURE, OB URINE: Culture: NO GROWTH

## 2022-10-24 ENCOUNTER — Encounter: Payer: Medicaid Other | Admitting: Obstetrics and Gynecology

## 2022-10-26 ENCOUNTER — Other Ambulatory Visit: Payer: Self-pay | Admitting: *Deleted

## 2022-10-26 MED ORDER — PROMETHAZINE HCL 25 MG RE SUPP: 25.0000 mg | Freq: Four times a day (QID) | RECTAL | 0 refills | Status: DC | PRN

## 2022-10-27 ENCOUNTER — Other Ambulatory Visit: Payer: Medicaid Other

## 2022-10-28 ENCOUNTER — Other Ambulatory Visit: Payer: Medicaid Other

## 2022-10-28 DIAGNOSIS — R7989 Other specified abnormal findings of blood chemistry: Secondary | ICD-10-CM

## 2022-10-29 LAB — CREATININE, SERUM
Creatinine, Ser: 0.77 mg/dL (ref 0.57–1.00)
eGFR: 110 mL/min/{1.73_m2} (ref >59)

## 2022-10-31 ENCOUNTER — Other Ambulatory Visit: Payer: Self-pay | Admitting: *Deleted

## 2022-10-31 MED ORDER — PROMETHAZINE HCL 25 MG RE SUPP: 25.0000 mg | Freq: Four times a day (QID) | RECTAL | 1 refills | Status: DC | PRN

## 2022-11-11 ENCOUNTER — Encounter: Payer: Medicaid Other | Admitting: Family Medicine

## 2022-11-17 ENCOUNTER — Other Ambulatory Visit (HOSPITAL_COMMUNITY)
Admission: RE | Admit: 2022-11-17 | Discharge: 2022-11-17 | Disposition: A | Discharge status: Home / Self Care | Payer: Medicaid Other | Source: Ambulatory Visit | Attending: Obstetrics and Gynecology | Admitting: Obstetrics and Gynecology

## 2022-11-17 ENCOUNTER — Ambulatory Visit (INDEPENDENT_AMBULATORY_CARE_PROVIDER_SITE_OTHER): Payer: Medicaid Other | Admitting: Obstetrics and Gynecology

## 2022-11-17 ENCOUNTER — Encounter: Payer: Self-pay | Admitting: Obstetrics and Gynecology

## 2022-11-17 VITALS — BP 121/85 | HR 103 | Wt 232.0 lb

## 2022-11-17 DIAGNOSIS — O34219 Maternal care for unspecified type scar from previous cesarean delivery: Secondary | ICD-10-CM | POA: Diagnosis not present

## 2022-11-17 DIAGNOSIS — F32A Depression, unspecified: Secondary | ICD-10-CM

## 2022-11-17 DIAGNOSIS — Z3A11 11 weeks gestation of pregnancy: Secondary | ICD-10-CM

## 2022-11-17 DIAGNOSIS — O0990 Supervision of high risk pregnancy, unspecified, unspecified trimester: Secondary | ICD-10-CM

## 2022-11-17 DIAGNOSIS — F419 Anxiety disorder, unspecified: Secondary | ICD-10-CM | POA: Insufficient documentation

## 2022-11-17 DIAGNOSIS — Z6841 Body Mass Index (BMI) 40.0 and over, adult: Secondary | ICD-10-CM | POA: Diagnosis not present

## 2022-11-17 DIAGNOSIS — K8020 Calculus of gallbladder without cholecystitis without obstruction: Secondary | ICD-10-CM

## 2022-11-17 DIAGNOSIS — Z87898 Personal history of other specified conditions: Secondary | ICD-10-CM | POA: Insufficient documentation

## 2022-11-17 DIAGNOSIS — O99211 Obesity complicating pregnancy, first trimester: Secondary | ICD-10-CM | POA: Diagnosis not present

## 2022-11-17 DIAGNOSIS — Z8759 Personal history of other complications of pregnancy, childbirth and the puerperium: Secondary | ICD-10-CM | POA: Insufficient documentation

## 2022-11-17 DIAGNOSIS — O99210 Obesity complicating pregnancy, unspecified trimester: Secondary | ICD-10-CM

## 2022-11-17 DIAGNOSIS — O34211 Maternal care for low transverse scar from previous cesarean delivery: Secondary | ICD-10-CM

## 2022-11-17 DIAGNOSIS — Z87442 Personal history of urinary calculi: Secondary | ICD-10-CM | POA: Insufficient documentation

## 2022-11-17 DIAGNOSIS — O0991 Supervision of high risk pregnancy, unspecified, first trimester: Secondary | ICD-10-CM

## 2022-11-17 HISTORY — DX: Depression, unspecified: F32.A

## 2022-11-17 HISTORY — DX: Personal history of urinary calculi: Z87.442

## 2022-11-17 HISTORY — DX: Calculus of gallbladder without cholecystitis without obstruction: K80.20

## 2022-11-17 HISTORY — DX: Anxiety disorder, unspecified: F41.9

## 2022-11-17 MED ORDER — ASPIRIN 81 MG PO TBEC: 81.0000 mg | DELAYED_RELEASE_TABLET | Freq: Every day | ORAL | 1 refills | Status: DC

## 2022-11-17 MED ORDER — TAMSULOSIN HCL 0.4 MG PO CAPS: 0.4000 mg | ORAL_CAPSULE | Freq: Every day | ORAL | 0 refills | Status: DC | PRN

## 2022-11-17 MED ORDER — PROMETHAZINE HCL 25 MG PO TABS: 25.0000 mg | ORAL_TABLET | Freq: Four times a day (QID) | ORAL | 2 refills | Status: DC | PRN

## 2022-11-17 MED ORDER — PROMETHAZINE HCL 25 MG RE SUPP: 25.0000 mg | Freq: Four times a day (QID) | RECTAL | 1 refills | Status: DC | PRN

## 2022-11-17 NOTE — Progress Notes (Signed)
NOB   Last Pap:08/22/2019  Genetic Screening: Desires and wants to know gender.  CC: Nausea need Refill on Rx. Notes improvement. Pain at incision area.
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
## 2022-11-18 LAB — PROTEIN / CREATININE RATIO, URINE
Creatinine, Urine: 136 mg/dL
Protein, Ur: 16.3 mg/dL
Protein/Creat Ratio: 120 mg/g creat (ref 0–200)

## 2022-11-18 LAB — CBC/D/PLT+RPR+RH+ABO+RUBIGG...
Antibody Screen: NEGATIVE
Basophils Absolute: 0 10*3/uL (ref 0.0–0.2)
Basos: 0 %
EOS (ABSOLUTE): 0.1 10*3/uL (ref 0.0–0.4)
Eos: 1 %
HCV Ab: NONREACTIVE
HIV Screen 4th Generation wRfx: NONREACTIVE
Hematocrit: 40.8 % (ref 34.0–46.6)
Hemoglobin: 13.1 g/dL (ref 11.1–15.9)
Hepatitis B Surface Ag: NEGATIVE
Immature Grans (Abs): 0 10*3/uL (ref 0.0–0.1)
Immature Granulocytes: 0 %
Lymphocytes Absolute: 2.8 10*3/uL (ref 0.7–3.1)
Lymphs: 19 %
MCH: 29 pg (ref 26.6–33.0)
MCHC: 32.1 g/dL (ref 31.5–35.7)
MCV: 90 fL (ref 79–97)
Monocytes Absolute: 0.6 10*3/uL (ref 0.1–0.9)
Monocytes: 4 %
Neutrophils Absolute: 11.2 10*3/uL — ABNORMAL HIGH (ref 1.4–7.0)
Neutrophils: 76 %
Platelets: 280 10*3/uL (ref 150–450)
RBC: 4.52 x10E6/uL (ref 3.77–5.28)
RDW: 13 % (ref 11.7–15.4)
RPR Ser Ql: NONREACTIVE
Rh Factor: NEGATIVE
Rubella Antibodies, IGG: 2.5 index (ref >0.99)
WBC: 14.8 10*3/uL — ABNORMAL HIGH (ref 3.4–10.8)

## 2022-11-18 LAB — COMPREHENSIVE METABOLIC PANEL
ALT: 12 IU/L (ref 0–32)
AST: 19 IU/L (ref 0–40)
Albumin/Globulin Ratio: 1.3 (ref 1.2–2.2)
Albumin: 4.2 g/dL (ref 4.0–5.0)
Alkaline Phosphatase: 72 IU/L (ref 44–121)
BUN/Creatinine Ratio: 12 (ref 9–23)
BUN: 7 mg/dL (ref 6–20)
Bilirubin Total: 0.2 mg/dL (ref 0.0–1.2)
CO2: 17 mmol/L — ABNORMAL LOW (ref 20–29)
Calcium: 9.7 mg/dL (ref 8.7–10.2)
Chloride: 100 mmol/L (ref 96–106)
Creatinine, Ser: 0.6 mg/dL (ref 0.57–1.00)
Globulin, Total: 3.2 g/dL (ref 1.5–4.5)
Glucose: 80 mg/dL (ref 70–99)
Potassium: 4.5 mmol/L (ref 3.5–5.2)
Sodium: 138 mmol/L (ref 134–144)
Total Protein: 7.4 g/dL (ref 6.0–8.5)
eGFR: 128 mL/min/{1.73_m2} (ref >59)

## 2022-11-18 LAB — HCV INTERPRETATION

## 2022-11-18 LAB — HEMOGLOBIN A1C
Est. average glucose Bld gHb Est-mCnc: 108 mg/dL
Hgb A1c MFr Bld: 5.4 % (ref 4.8–5.6)

## 2022-11-18 LAB — TSH RFX ON ABNORMAL TO FREE T4: TSH: 1.55 u[IU]/mL (ref 0.450–4.500)

## 2022-11-19 ENCOUNTER — Encounter: Payer: Self-pay | Admitting: Obstetrics and Gynecology

## 2022-11-21 LAB — CYTOLOGY - PAP
Chlamydia: NEGATIVE
Comment: NEGATIVE
Comment: NORMAL
Diagnosis: NEGATIVE
Neisseria Gonorrhea: NEGATIVE

## 2022-11-22 NOTE — BH Specialist Note (Signed)
Pt did not arrive to video visit and did not answer the phone; Left HIPPA-compliant message to call back Lyden Redner from [facility]  [facility] at  [phone] (Kiera Hussey's office).  ?; left MyChart message for patient.  ? ?

## 2022-11-23 LAB — PANORAMA PRENATAL TEST FULL PANEL:PANORAMA TEST PLUS 5 ADDITIONAL MICRODELETIONS: FETAL FRACTION: 3.9

## 2022-11-25 LAB — HORIZON CUSTOM: REPORT SUMMARY: NEGATIVE

## 2022-12-06 ENCOUNTER — Ambulatory Visit: Payer: Medicaid Other | Admitting: Clinical

## 2022-12-06 DIAGNOSIS — Z91199 Patient's noncompliance with other medical treatment and regimen due to unspecified reason: Secondary | ICD-10-CM

## 2022-12-21 ENCOUNTER — Encounter: Payer: Commercial Managed Care - HMO | Admitting: Obstetrics and Gynecology

## 2022-12-22 ENCOUNTER — Ambulatory Visit (INDEPENDENT_AMBULATORY_CARE_PROVIDER_SITE_OTHER): Payer: Medicaid Other | Admitting: Advanced Practice Midwife

## 2022-12-22 VITALS — BP 125/85 | HR 86 | Wt 241.0 lb

## 2022-12-22 DIAGNOSIS — Z3A16 16 weeks gestation of pregnancy: Secondary | ICD-10-CM

## 2022-12-22 DIAGNOSIS — O0990 Supervision of high risk pregnancy, unspecified, unspecified trimester: Secondary | ICD-10-CM

## 2022-12-22 DIAGNOSIS — O0992 Supervision of high risk pregnancy, unspecified, second trimester: Secondary | ICD-10-CM

## 2022-12-22 DIAGNOSIS — O34211 Maternal care for low transverse scar from previous cesarean delivery: Secondary | ICD-10-CM

## 2022-12-22 DIAGNOSIS — Z98891 History of uterine scar from previous surgery: Secondary | ICD-10-CM

## 2022-12-22 NOTE — Progress Notes (Signed)
ROB  Notes discomfort at C-Section.

## 2022-12-24 LAB — AFP, SERUM, OPEN SPINA BIFIDA
AFP MoM: 1.06
AFP Value: 26.8 ng/mL
Gest. Age on Collection Date: 16 weeks
Maternal Age At EDD: 25.9 yr
OSBR Risk 1 IN: 10000
Test Results:: NEGATIVE
Weight: 241 [lb_av]
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
## 2023-01-03 ENCOUNTER — Encounter: Payer: Self-pay | Admitting: *Deleted

## 2023-01-09 ENCOUNTER — Encounter: Payer: Self-pay | Admitting: *Deleted

## 2023-01-09 ENCOUNTER — Encounter: Payer: Self-pay | Admitting: Obstetrics and Gynecology

## 2023-01-09 ENCOUNTER — Ambulatory Visit: Payer: Medicaid Other | Attending: Obstetrics and Gynecology

## 2023-01-09 ENCOUNTER — Ambulatory Visit: Payer: Medicaid Other | Admitting: *Deleted

## 2023-01-09 ENCOUNTER — Other Ambulatory Visit: Payer: Self-pay | Admitting: *Deleted

## 2023-01-09 VITALS — BP 118/71 | HR 92

## 2023-01-09 DIAGNOSIS — O99212 Obesity complicating pregnancy, second trimester: Secondary | ICD-10-CM

## 2023-01-09 DIAGNOSIS — Z3A11 11 weeks gestation of pregnancy: Secondary | ICD-10-CM | POA: Insufficient documentation

## 2023-01-09 DIAGNOSIS — O0990 Supervision of high risk pregnancy, unspecified, unspecified trimester: Secondary | ICD-10-CM

## 2023-01-09 DIAGNOSIS — O43199 Other malformation of placenta, unspecified trimester: Secondary | ICD-10-CM

## 2023-01-09 DIAGNOSIS — Z362 Encounter for other antenatal screening follow-up: Secondary | ICD-10-CM

## 2023-01-16 ENCOUNTER — Encounter: Payer: Self-pay | Admitting: Obstetrics and Gynecology

## 2023-01-16 ENCOUNTER — Other Ambulatory Visit: Payer: Self-pay | Admitting: *Deleted

## 2023-01-16 MED ORDER — CETIRIZINE HCL 10 MG PO TABS: 10.0000 mg | ORAL_TABLET | Freq: Every day | ORAL | 1 refills | Status: DC

## 2023-01-18 ENCOUNTER — Telehealth: Payer: Self-pay

## 2023-01-18 ENCOUNTER — Encounter: Payer: Commercial Managed Care - HMO | Admitting: Obstetrics & Gynecology

## 2023-01-18 NOTE — Telephone Encounter (Signed)
Left message for pt to call office back regarding today's appt

## 2023-01-24 ENCOUNTER — Encounter: Payer: Medicaid Other | Admitting: Obstetrics and Gynecology

## 2023-02-10 ENCOUNTER — Encounter: Payer: Self-pay | Admitting: *Deleted

## 2023-02-13 ENCOUNTER — Ambulatory Visit: Payer: Medicaid Other | Attending: Maternal & Fetal Medicine

## 2023-02-13 ENCOUNTER — Encounter: Payer: Self-pay | Admitting: *Deleted

## 2023-02-13 ENCOUNTER — Ambulatory Visit: Payer: Medicaid Other | Admitting: *Deleted

## 2023-02-13 VITALS — BP 119/84 | HR 99

## 2023-02-13 DIAGNOSIS — Z362 Encounter for other antenatal screening follow-up: Secondary | ICD-10-CM | POA: Insufficient documentation

## 2023-02-13 DIAGNOSIS — O99212 Obesity complicating pregnancy, second trimester: Secondary | ICD-10-CM | POA: Diagnosis not present

## 2023-02-13 DIAGNOSIS — O0990 Supervision of high risk pregnancy, unspecified, unspecified trimester: Secondary | ICD-10-CM

## 2023-02-13 DIAGNOSIS — O34219 Maternal care for unspecified type scar from previous cesarean delivery: Secondary | ICD-10-CM | POA: Diagnosis not present

## 2023-02-13 DIAGNOSIS — E669 Obesity, unspecified: Secondary | ICD-10-CM | POA: Diagnosis not present

## 2023-02-13 DIAGNOSIS — Z3A24 24 weeks gestation of pregnancy: Secondary | ICD-10-CM

## 2023-02-13 DIAGNOSIS — O43199 Other malformation of placenta, unspecified trimester: Secondary | ICD-10-CM | POA: Insufficient documentation

## 2023-02-13 DIAGNOSIS — O43192 Other malformation of placenta, second trimester: Secondary | ICD-10-CM | POA: Diagnosis not present

## 2023-02-14 ENCOUNTER — Other Ambulatory Visit: Payer: Self-pay | Admitting: *Deleted

## 2023-02-14 DIAGNOSIS — O99212 Obesity complicating pregnancy, second trimester: Secondary | ICD-10-CM

## 2023-02-14 DIAGNOSIS — O43192 Other malformation of placenta, second trimester: Secondary | ICD-10-CM

## 2023-02-15 ENCOUNTER — Ambulatory Visit (INDEPENDENT_AMBULATORY_CARE_PROVIDER_SITE_OTHER): Payer: Medicaid Other | Admitting: Obstetrics and Gynecology

## 2023-02-15 VITALS — BP 135/88 | HR 111 | Wt 248.0 lb

## 2023-02-15 DIAGNOSIS — O43199 Other malformation of placenta, unspecified trimester: Secondary | ICD-10-CM

## 2023-02-15 DIAGNOSIS — Z8759 Personal history of other complications of pregnancy, childbirth and the puerperium: Secondary | ICD-10-CM

## 2023-02-15 DIAGNOSIS — O34219 Maternal care for unspecified type scar from previous cesarean delivery: Secondary | ICD-10-CM

## 2023-02-15 DIAGNOSIS — O0990 Supervision of high risk pregnancy, unspecified, unspecified trimester: Secondary | ICD-10-CM

## 2023-02-15 DIAGNOSIS — O360930 Maternal care for other rhesus isoimmunization, third trimester, not applicable or unspecified: Secondary | ICD-10-CM

## 2023-02-15 DIAGNOSIS — Z6791 Unspecified blood type, Rh negative: Secondary | ICD-10-CM

## 2023-02-15 DIAGNOSIS — O99210 Obesity complicating pregnancy, unspecified trimester: Secondary | ICD-10-CM

## 2023-02-15 DIAGNOSIS — O3662X0 Maternal care for excessive fetal growth, second trimester, not applicable or unspecified: Secondary | ICD-10-CM

## 2023-02-15 DIAGNOSIS — Z3A24 24 weeks gestation of pregnancy: Secondary | ICD-10-CM

## 2023-02-15 DIAGNOSIS — Z6841 Body Mass Index (BMI) 40.0 and over, adult: Secondary | ICD-10-CM

## 2023-02-15 NOTE — Progress Notes (Unsigned)
CC: ROB  Pt denies any concerns
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
## 2023-03-08 ENCOUNTER — Other Ambulatory Visit (INDEPENDENT_AMBULATORY_CARE_PROVIDER_SITE_OTHER): Payer: Medicaid Other

## 2023-03-08 ENCOUNTER — Ambulatory Visit (INDEPENDENT_AMBULATORY_CARE_PROVIDER_SITE_OTHER): Payer: Medicaid Other | Admitting: Family Medicine

## 2023-03-08 ENCOUNTER — Encounter: Payer: Self-pay | Admitting: *Deleted

## 2023-03-08 VITALS — BP 125/90 | HR 102 | Wt 252.0 lb

## 2023-03-08 DIAGNOSIS — O3662X0 Maternal care for excessive fetal growth, second trimester, not applicable or unspecified: Secondary | ICD-10-CM

## 2023-03-08 DIAGNOSIS — O360920 Maternal care for other rhesus isoimmunization, second trimester, not applicable or unspecified: Secondary | ICD-10-CM

## 2023-03-08 DIAGNOSIS — Z3A27 27 weeks gestation of pregnancy: Secondary | ICD-10-CM

## 2023-03-08 DIAGNOSIS — O26899 Other specified pregnancy related conditions, unspecified trimester: Secondary | ICD-10-CM

## 2023-03-08 DIAGNOSIS — Z6791 Unspecified blood type, Rh negative: Secondary | ICD-10-CM

## 2023-03-08 DIAGNOSIS — Z23 Encounter for immunization: Secondary | ICD-10-CM

## 2023-03-08 DIAGNOSIS — O0990 Supervision of high risk pregnancy, unspecified, unspecified trimester: Secondary | ICD-10-CM

## 2023-03-08 DIAGNOSIS — Z8759 Personal history of other complications of pregnancy, childbirth and the puerperium: Secondary | ICD-10-CM

## 2023-03-08 DIAGNOSIS — O34219 Maternal care for unspecified type scar from previous cesarean delivery: Secondary | ICD-10-CM

## 2023-03-08 MED ORDER — RHO D IMMUNE GLOBULIN 1500 UNIT/2ML IJ SOSY
300.0000 ug | PREFILLED_SYRINGE | Freq: Once | INTRAMUSCULAR | Status: AC
  Administered 2023-03-08: 300 ug via INTRAMUSCULAR
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
## 2023-03-08 NOTE — Progress Notes (Signed)
CC: checking BP at home and has been running 140/95 yesterday

## 2023-03-09 ENCOUNTER — Encounter: Payer: Self-pay | Admitting: Family Medicine

## 2023-03-09 ENCOUNTER — Other Ambulatory Visit: Payer: Self-pay | Admitting: *Deleted

## 2023-03-09 DIAGNOSIS — O24410 Gestational diabetes mellitus in pregnancy, diet controlled: Secondary | ICD-10-CM

## 2023-03-09 DIAGNOSIS — O24419 Gestational diabetes mellitus in pregnancy, unspecified control: Secondary | ICD-10-CM | POA: Insufficient documentation

## 2023-03-09 LAB — CBC
Hematocrit: 37.5 % (ref 34.0–46.6)
Hemoglobin: 12.2 g/dL (ref 11.1–15.9)
MCH: 29.6 pg (ref 26.6–33.0)
MCHC: 32.5 g/dL (ref 31.5–35.7)
MCV: 91 fL (ref 79–97)
Platelets: 270 10*3/uL (ref 150–450)
RBC: 4.12 x10E6/uL (ref 3.77–5.28)
RDW: 13.1 % (ref 11.7–15.4)
WBC: 16.6 10*3/uL — ABNORMAL HIGH (ref 3.4–10.8)

## 2023-03-09 LAB — ANTIBODY SCREEN: Antibody Screen: NEGATIVE

## 2023-03-09 LAB — GLUCOSE TOLERANCE, 2 HOURS W/ 1HR
Glucose, 1 hour: 185 mg/dL — ABNORMAL HIGH (ref 70–179)
Glucose, 2 hour: 97 mg/dL (ref 70–152)
Glucose, Fasting: 104 mg/dL — ABNORMAL HIGH (ref 70–91)

## 2023-03-09 LAB — RPR: RPR Ser Ql: NONREACTIVE

## 2023-03-09 LAB — HIV ANTIBODY (ROUTINE TESTING W REFLEX): HIV Screen 4th Generation wRfx: NONREACTIVE

## 2023-03-09 MED ORDER — ACCU-CHEK GUIDE VI STRP
ORAL_STRIP | 12 refills | Status: DC
Start: 2023-03-09 — End: 2023-04-28

## 2023-03-09 MED ORDER — ACCU-CHEK GUIDE W/DEVICE KIT
1.0000 | PACK | Freq: Four times a day (QID) | 0 refills | Status: DC
Start: 2023-03-09 — End: 2023-04-28

## 2023-03-09 MED ORDER — ACCU-CHEK SOFTCLIX LANCETS MISC
1.0000 | Freq: Four times a day (QID) | 12 refills | Status: DC
Start: 2023-03-09 — End: 2023-04-28

## 2023-03-21 ENCOUNTER — Telehealth (INDEPENDENT_AMBULATORY_CARE_PROVIDER_SITE_OTHER): Payer: Medicaid Other | Admitting: Obstetrics and Gynecology

## 2023-03-21 ENCOUNTER — Encounter: Payer: Self-pay | Admitting: Obstetrics and Gynecology

## 2023-03-21 VITALS — BP 140/90

## 2023-03-21 DIAGNOSIS — O3662X0 Maternal care for excessive fetal growth, second trimester, not applicable or unspecified: Secondary | ICD-10-CM

## 2023-03-21 DIAGNOSIS — O26899 Other specified pregnancy related conditions, unspecified trimester: Secondary | ICD-10-CM

## 2023-03-21 DIAGNOSIS — O24419 Gestational diabetes mellitus in pregnancy, unspecified control: Secondary | ICD-10-CM

## 2023-03-21 DIAGNOSIS — O43199 Other malformation of placenta, unspecified trimester: Secondary | ICD-10-CM

## 2023-03-21 DIAGNOSIS — O99210 Obesity complicating pregnancy, unspecified trimester: Secondary | ICD-10-CM

## 2023-03-21 DIAGNOSIS — O0990 Supervision of high risk pregnancy, unspecified, unspecified trimester: Secondary | ICD-10-CM

## 2023-03-21 DIAGNOSIS — O34219 Maternal care for unspecified type scar from previous cesarean delivery: Secondary | ICD-10-CM

## 2023-03-21 DIAGNOSIS — Z8759 Personal history of other complications of pregnancy, childbirth and the puerperium: Secondary | ICD-10-CM

## 2023-03-21 DIAGNOSIS — Z6791 Unspecified blood type, Rh negative: Secondary | ICD-10-CM

## 2023-03-21 DIAGNOSIS — Z3A29 29 weeks gestation of pregnancy: Secondary | ICD-10-CM
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
## 2023-03-21 NOTE — Progress Notes (Signed)
CC: denies any concerns  Pt stating that she does have a headache currently   Blood Sugar 125

## 2023-03-22 ENCOUNTER — Other Ambulatory Visit: Payer: Self-pay | Admitting: *Deleted

## 2023-03-22 ENCOUNTER — Ambulatory Visit: Payer: Medicaid Other | Admitting: Registered"

## 2023-03-22 ENCOUNTER — Other Ambulatory Visit: Payer: Medicaid Other

## 2023-03-22 DIAGNOSIS — O0990 Supervision of high risk pregnancy, unspecified, unspecified trimester: Secondary | ICD-10-CM

## 2023-03-22 DIAGNOSIS — Z8759 Personal history of other complications of pregnancy, childbirth and the puerperium: Secondary | ICD-10-CM

## 2023-03-22 NOTE — Progress Notes (Deleted)
The following learning objectives were met by the patient during this course:   States the definition of Gestational Diabetes States why dietary management is important in controlling blood glucose Describes the effects each nutrient has on blood glucose levels Demonstrates ability to create a balanced meal plan Demonstrates carbohydrate counting  States when to check blood glucose levels Demonstrates proper blood glucose monitoring techniques States the effect of stress and exercise on blood glucose levels States the importance of limiting caffeine and abstaining from alcohol and smoking   Blood glucose monitor given: None. Patient has meter and checking blood sugar prior to class.   Patient instructed to monitor glucose levels: FBS: 60 - ? 95 mg/dL; 1 hr: <140 OR 2 hr: <120   Patient received handouts: Nutrition Diabetes and Pregnancy, including carb counting list and glucose log sheet   Patient will be seen for follow-up as needed. 

## 2023-03-23 LAB — CBC
Hematocrit: 38.8 % (ref 34.0–46.6)
Hemoglobin: 12.6 g/dL (ref 11.1–15.9)
MCH: 29.1 pg (ref 26.6–33.0)
MCHC: 32.5 g/dL (ref 31.5–35.7)
MCV: 90 fL (ref 79–97)
Platelets: 266 10*3/uL (ref 150–450)
RBC: 4.33 x10E6/uL (ref 3.77–5.28)
RDW: 12.9 % (ref 11.7–15.4)
WBC: 18 10*3/uL — ABNORMAL HIGH (ref 3.4–10.8)

## 2023-03-23 LAB — COMPREHENSIVE METABOLIC PANEL
ALT: 10 IU/L (ref 0–32)
AST: 14 IU/L (ref 0–40)
Albumin: 3.4 g/dL — ABNORMAL LOW (ref 4.0–5.0)
Alkaline Phosphatase: 89 IU/L (ref 44–121)
BUN/Creatinine Ratio: 7 — ABNORMAL LOW (ref 9–23)
BUN: 5 mg/dL — ABNORMAL LOW (ref 6–20)
Bilirubin Total: <0.2 mg/dL (ref 0.0–1.2)
CO2: 19 mmol/L — ABNORMAL LOW (ref 20–29)
Calcium: 9.3 mg/dL (ref 8.7–10.2)
Chloride: 102 mmol/L (ref 96–106)
Creatinine, Ser: 0.67 mg/dL (ref 0.57–1.00)
Globulin, Total: 3 g/dL (ref 1.5–4.5)
Glucose: 115 mg/dL — ABNORMAL HIGH (ref 70–99)
Potassium: 4.6 mmol/L (ref 3.5–5.2)
Sodium: 137 mmol/L (ref 134–144)
Total Protein: 6.4 g/dL (ref 6.0–8.5)
eGFR: 124 mL/min/{1.73_m2} (ref >59)

## 2023-03-23 LAB — PROTEIN / CREATININE RATIO, URINE
Creatinine, Urine: 93.8 mg/dL
Protein, Ur: 21.7 mg/dL
Protein/Creat Ratio: 231 mg/g creat — ABNORMAL HIGH (ref 0–200)

## 2023-03-27 ENCOUNTER — Ambulatory Visit: Payer: Medicaid Other | Attending: Obstetrics and Gynecology

## 2023-03-27 ENCOUNTER — Ambulatory Visit: Payer: Medicaid Other | Admitting: *Deleted

## 2023-03-27 ENCOUNTER — Encounter: Payer: Self-pay | Admitting: *Deleted

## 2023-03-27 VITALS — BP 129/91 | HR 130

## 2023-03-27 DIAGNOSIS — O0990 Supervision of high risk pregnancy, unspecified, unspecified trimester: Secondary | ICD-10-CM | POA: Insufficient documentation

## 2023-03-27 DIAGNOSIS — O43193 Other malformation of placenta, third trimester: Secondary | ICD-10-CM

## 2023-03-27 DIAGNOSIS — O99213 Obesity complicating pregnancy, third trimester: Secondary | ICD-10-CM

## 2023-03-27 DIAGNOSIS — O43192 Other malformation of placenta, second trimester: Secondary | ICD-10-CM | POA: Insufficient documentation

## 2023-03-27 DIAGNOSIS — O09293 Supervision of pregnancy with other poor reproductive or obstetric history, third trimester: Secondary | ICD-10-CM

## 2023-03-27 DIAGNOSIS — Z3A30 30 weeks gestation of pregnancy: Secondary | ICD-10-CM

## 2023-03-27 DIAGNOSIS — O24419 Gestational diabetes mellitus in pregnancy, unspecified control: Secondary | ICD-10-CM

## 2023-03-27 DIAGNOSIS — O360130 Maternal care for anti-D [Rh] antibodies, third trimester, not applicable or unspecified: Secondary | ICD-10-CM | POA: Diagnosis not present

## 2023-03-27 DIAGNOSIS — O99212 Obesity complicating pregnancy, second trimester: Secondary | ICD-10-CM | POA: Diagnosis present

## 2023-03-27 DIAGNOSIS — O24410 Gestational diabetes mellitus in pregnancy, diet controlled: Secondary | ICD-10-CM | POA: Diagnosis not present

## 2023-03-27 DIAGNOSIS — E669 Obesity, unspecified: Secondary | ICD-10-CM

## 2023-03-30 ENCOUNTER — Other Ambulatory Visit: Payer: Self-pay | Admitting: *Deleted

## 2023-03-30 DIAGNOSIS — O43199 Other malformation of placenta, unspecified trimester: Secondary | ICD-10-CM

## 2023-03-30 DIAGNOSIS — Z3689 Encounter for other specified antenatal screening: Secondary | ICD-10-CM

## 2023-03-30 DIAGNOSIS — O24419 Gestational diabetes mellitus in pregnancy, unspecified control: Secondary | ICD-10-CM

## 2023-03-30 DIAGNOSIS — O99213 Obesity complicating pregnancy, third trimester: Secondary | ICD-10-CM

## 2023-03-30 DIAGNOSIS — O34219 Maternal care for unspecified type scar from previous cesarean delivery: Secondary | ICD-10-CM

## 2023-04-05 ENCOUNTER — Telehealth (INDEPENDENT_AMBULATORY_CARE_PROVIDER_SITE_OTHER): Payer: Medicaid Other | Admitting: Obstetrics and Gynecology

## 2023-04-05 DIAGNOSIS — Z8781 Personal history of (healed) traumatic fracture: Secondary | ICD-10-CM | POA: Insufficient documentation

## 2023-04-05 DIAGNOSIS — Z6791 Unspecified blood type, Rh negative: Secondary | ICD-10-CM

## 2023-04-05 DIAGNOSIS — O34219 Maternal care for unspecified type scar from previous cesarean delivery: Secondary | ICD-10-CM

## 2023-04-05 DIAGNOSIS — Z6841 Body Mass Index (BMI) 40.0 and over, adult: Secondary | ICD-10-CM

## 2023-04-05 DIAGNOSIS — O24419 Gestational diabetes mellitus in pregnancy, unspecified control: Secondary | ICD-10-CM

## 2023-04-05 DIAGNOSIS — O133 Gestational [pregnancy-induced] hypertension without significant proteinuria, third trimester: Secondary | ICD-10-CM

## 2023-04-05 DIAGNOSIS — O99213 Obesity complicating pregnancy, third trimester: Secondary | ICD-10-CM

## 2023-04-05 DIAGNOSIS — O99210 Obesity complicating pregnancy, unspecified trimester: Secondary | ICD-10-CM

## 2023-04-05 DIAGNOSIS — O26893 Other specified pregnancy related conditions, third trimester: Secondary | ICD-10-CM

## 2023-04-05 DIAGNOSIS — O43199 Other malformation of placenta, unspecified trimester: Secondary | ICD-10-CM

## 2023-04-05 DIAGNOSIS — O26899 Other specified pregnancy related conditions, unspecified trimester: Secondary | ICD-10-CM

## 2023-04-05 DIAGNOSIS — Z3A31 31 weeks gestation of pregnancy: Secondary | ICD-10-CM

## 2023-04-05 DIAGNOSIS — O43193 Other malformation of placenta, third trimester: Secondary | ICD-10-CM

## 2023-04-05 DIAGNOSIS — O0990 Supervision of high risk pregnancy, unspecified, unspecified trimester: Secondary | ICD-10-CM

## 2023-04-05 DIAGNOSIS — O1400 Mild to moderate pre-eclampsia, unspecified trimester: Secondary | ICD-10-CM | POA: Insufficient documentation

## 2023-04-05 DIAGNOSIS — O0993 Supervision of high risk pregnancy, unspecified, third trimester: Secondary | ICD-10-CM

## 2023-04-05 MED ORDER — NIFEDIPINE ER OSMOTIC RELEASE 30 MG PO TB24: 30.0000 mg | ORAL_TABLET | Freq: Every day | ORAL | 2 refills | Status: DC
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
## 2023-04-05 NOTE — Patient Instructions (Signed)
Continue to check your blood pressures once or twice a day Call the office for blood pressures that are consistently above 150 for the top number or 100 for the bottom number   Hypertension During Pregnancy Hypertension is also called high blood pressure. High blood pressure means that the force of your blood moving in your body is too strong. It can cause problems for you and your baby. Different types of high blood pressure can happen during pregnancy. The types are: High blood pressure before you got pregnant. This is called chronic hypertension.  This can continue during your pregnancy. Your doctor will want to keep checking your blood pressure. You may need medicine to keep your blood pressure under control while you are pregnant. You will need follow-up visits after you have your baby. High blood pressure that goes up during pregnancy when it was normal before. This is called gestational hypertension. It will usually get better after you have your baby, but your doctor will need to watch your blood pressure to make sure that it is getting better. Very high blood pressure during pregnancy. This is called preeclampsia. Very high blood pressure is an emergency that needs to be checked and treated right away. You may develop very high blood pressure after giving birth. This is called postpartum preeclampsia. This usually occurs within 48 hours after childbirth but may occur up to 6 weeks after giving birth. This is rare. How does this affect me? If you have high blood pressure during pregnancy, you have a higher chance of developing high blood pressure: As you get older. If you get pregnant again. In some cases, high blood pressure during pregnancy can cause: Stroke. Heart attack. Damage to the kidneys, lungs, or liver. Preeclampsia. Jerky movements you cannot control (convulsions or seizures). Problems with the placenta.  What can I do to lower my risk?  Keep a healthy weight. Eat a  healthy diet. Follow what your doctor tells you about treating any medical problems that you had before becoming pregnant. It is very important to go to all of your doctor visits. Your doctor will check your blood pressure and make sure that your pregnancy is progressing as it should. Treatment should start early if a problem is found.  Follow these instructions at home:  Take your blood pressure 1-2 times per day. Call the office if your blood pressure is 155 or higher for the top number or 105 or higher for the bottom number.    Eating and drinking  Drink enough fluid to keep your pee (urine) pale yellow. Avoid caffeine. Lifestyle Do not use any products that contain nicotine or tobacco, such as cigarettes, e-cigarettes, and chewing tobacco. If you need help quitting, ask your doctor. Do not use alcohol or drugs. Avoid stress. Rest and get plenty of sleep. Regular exercise can help. Ask your doctor what kinds of exercise are best for you. General instructions Take over-the-counter and prescription medicines only as told by your doctor. Keep all prenatal and follow-up visits as told by your doctor. This is important. Contact a doctor if: You have symptoms that your doctor told you to watch for, such as: Headaches. Nausea. Vomiting. Belly (abdominal) pain. Dizziness. Light-headedness. Get help right away if: You have: Very bad belly pain that does not get better with treatment. A very bad headache that does not get better. Vomiting that does not get better. Sudden, fast weight gain. Sudden swelling in your hands, ankles, or face. Blood in your pee. Blurry vision.  Double vision. Shortness of breath. Chest pain. Weakness on one side of your body. Trouble talking. Summary High blood pressure is also called hypertension. High blood pressure means that the force of your blood moving in your body is too strong. High blood pressure can cause problems for you and your  baby. Keep all follow-up visits as told by your doctor. This is important. This information is not intended to replace advice given to you by your health care provider. Make sure you discuss any questions you have with your health care provider. Document Released: 10/22/2010 Document Revised: 01/10/2019 Document Reviewed: 10/16/2018 Elsevier Patient Education  2020 [employer].

## 2023-04-07 ENCOUNTER — Other Ambulatory Visit: Payer: Medicaid Other

## 2023-04-07 DIAGNOSIS — O133 Gestational [pregnancy-induced] hypertension without significant proteinuria, third trimester: Secondary | ICD-10-CM

## 2023-04-08 LAB — COMPREHENSIVE METABOLIC PANEL
ALT: 7 IU/L (ref 0–32)
AST: 12 IU/L (ref 0–40)
Albumin: 3.2 g/dL — ABNORMAL LOW (ref 4.0–5.0)
Alkaline Phosphatase: 84 IU/L (ref 44–121)
BUN/Creatinine Ratio: 12 (ref 9–23)
BUN: 8 mg/dL (ref 6–20)
Bilirubin Total: <0.2 mg/dL (ref 0.0–1.2)
CO2: 19 mmol/L — ABNORMAL LOW (ref 20–29)
Calcium: 9.6 mg/dL (ref 8.7–10.2)
Chloride: 103 mmol/L (ref 96–106)
Creatinine, Ser: 0.68 mg/dL (ref 0.57–1.00)
Globulin, Total: 2.6 g/dL (ref 1.5–4.5)
Glucose: 116 mg/dL — ABNORMAL HIGH (ref 70–99)
Potassium: 4.1 mmol/L (ref 3.5–5.2)
Sodium: 137 mmol/L (ref 134–144)
Total Protein: 5.8 g/dL — ABNORMAL LOW (ref 6.0–8.5)
eGFR: 124 mL/min/{1.73_m2} (ref >59)

## 2023-04-08 LAB — CBC
Hematocrit: 36.8 % (ref 34.0–46.6)
Hemoglobin: 11.7 g/dL (ref 11.1–15.9)
MCH: 28.8 pg (ref 26.6–33.0)
MCHC: 31.8 g/dL (ref 31.5–35.7)
MCV: 91 fL (ref 79–97)
Platelets: 210 10*3/uL (ref 150–450)
RBC: 4.06 x10E6/uL (ref 3.77–5.28)
RDW: 13 % (ref 11.7–15.4)
WBC: 16.2 10*3/uL — ABNORMAL HIGH (ref 3.4–10.8)

## 2023-04-08 LAB — PROTEIN / CREATININE RATIO, URINE
Creatinine, Urine: 90.3 mg/dL
Protein, Ur: 48.5 mg/dL
Protein/Creat Ratio: 537 mg/g creat — ABNORMAL HIGH (ref 0–200)

## 2023-04-10 ENCOUNTER — Ambulatory Visit: Payer: Medicaid Other

## 2023-04-10 ENCOUNTER — Encounter: Payer: Self-pay | Admitting: Obstetrics and Gynecology

## 2023-04-12 ENCOUNTER — Ambulatory Visit (INDEPENDENT_AMBULATORY_CARE_PROVIDER_SITE_OTHER): Payer: Medicaid Other | Admitting: Family Medicine

## 2023-04-12 ENCOUNTER — Other Ambulatory Visit (INDEPENDENT_AMBULATORY_CARE_PROVIDER_SITE_OTHER): Payer: Medicaid Other

## 2023-04-12 ENCOUNTER — Ambulatory Visit (INDEPENDENT_AMBULATORY_CARE_PROVIDER_SITE_OTHER): Payer: Medicaid Other | Admitting: *Deleted

## 2023-04-12 VITALS — BP 132/91 | HR 108 | Wt 261.0 lb

## 2023-04-12 DIAGNOSIS — O1493 Unspecified pre-eclampsia, third trimester: Secondary | ICD-10-CM | POA: Diagnosis not present

## 2023-04-12 DIAGNOSIS — O43199 Other malformation of placenta, unspecified trimester: Secondary | ICD-10-CM

## 2023-04-12 DIAGNOSIS — O24410 Gestational diabetes mellitus in pregnancy, diet controlled: Secondary | ICD-10-CM

## 2023-04-12 DIAGNOSIS — O0990 Supervision of high risk pregnancy, unspecified, unspecified trimester: Secondary | ICD-10-CM

## 2023-04-12 DIAGNOSIS — O24419 Gestational diabetes mellitus in pregnancy, unspecified control: Secondary | ICD-10-CM

## 2023-04-12 DIAGNOSIS — Z3A32 32 weeks gestation of pregnancy: Secondary | ICD-10-CM

## 2023-04-12 DIAGNOSIS — O26899 Other specified pregnancy related conditions, unspecified trimester: Secondary | ICD-10-CM

## 2023-04-12 DIAGNOSIS — O34219 Maternal care for unspecified type scar from previous cesarean delivery: Secondary | ICD-10-CM

## 2023-04-12 DIAGNOSIS — Z6791 Unspecified blood type, Rh negative: Secondary | ICD-10-CM
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
## 2023-04-12 NOTE — Progress Notes (Signed)
Patient informed that the ultrasound is considered a limited obstetric ultrasound and is not intended to be a complete ultrasound exam.  Patient also informed that the ultrasound is not being completed with the intent of assessing for fetal or placental anomalies or any pelvic abnormalities. Explained that the purpose of today's ultrasound is to assess for fetal well being.  Patient acknowledges the purpose of the exam and the limitations of the study.      Janssen Zee, RN      

## 2023-04-13 LAB — COMPREHENSIVE METABOLIC PANEL
ALT: 5 IU/L (ref 0–32)
AST: 14 IU/L (ref 0–40)
Albumin: 3.3 g/dL — ABNORMAL LOW (ref 4.0–5.0)
Alkaline Phosphatase: 95 IU/L (ref 44–121)
BUN/Creatinine Ratio: 11 (ref 9–23)
BUN: 8 mg/dL (ref 6–20)
Bilirubin Total: 0.3 mg/dL (ref 0.0–1.2)
CO2: 19 mmol/L — ABNORMAL LOW (ref 20–29)
Calcium: 9.1 mg/dL (ref 8.7–10.2)
Chloride: 103 mmol/L (ref 96–106)
Creatinine, Ser: 0.76 mg/dL (ref 0.57–1.00)
Globulin, Total: 2.8 g/dL (ref 1.5–4.5)
Glucose: 81 mg/dL (ref 70–99)
Potassium: 4.6 mmol/L (ref 3.5–5.2)
Sodium: 137 mmol/L (ref 134–144)
Total Protein: 6.1 g/dL (ref 6.0–8.5)
eGFR: 111 mL/min/{1.73_m2} (ref >59)

## 2023-04-13 LAB — CBC
Hematocrit: 36.6 % (ref 34.0–46.6)
Hemoglobin: 12.1 g/dL (ref 11.1–15.9)
MCH: 29.3 pg (ref 26.6–33.0)
MCHC: 33.1 g/dL (ref 31.5–35.7)
MCV: 89 fL (ref 79–97)
Platelets: 235 10*3/uL (ref 150–450)
RBC: 4.13 x10E6/uL (ref 3.77–5.28)
RDW: 12.9 % (ref 11.7–15.4)
WBC: 18.8 10*3/uL — ABNORMAL HIGH (ref 3.4–10.8)

## 2023-04-13 LAB — PROTEIN / CREATININE RATIO, URINE
Creatinine, Urine: 127.6 mg/dL
Protein, Ur: 34.9 mg/dL
Protein/Creat Ratio: 274 mg/g creat — ABNORMAL HIGH (ref 0–200)

## 2023-04-17 ENCOUNTER — Ambulatory Visit: Payer: Medicaid Other

## 2023-04-18 ENCOUNTER — Encounter (HOSPITAL_COMMUNITY): Payer: Self-pay | Admitting: Obstetrics and Gynecology

## 2023-04-18 ENCOUNTER — Telehealth: Payer: Self-pay | Admitting: *Deleted

## 2023-04-18 ENCOUNTER — Other Ambulatory Visit: Payer: Self-pay

## 2023-04-18 ENCOUNTER — Inpatient Hospital Stay (HOSPITAL_COMMUNITY)
Admission: AD | Admit: 2023-04-18 | Discharge: 2023-04-18 | Disposition: A | Discharge status: Home / Self Care | Payer: Medicaid Other | Attending: Obstetrics and Gynecology | Admitting: Obstetrics and Gynecology

## 2023-04-18 DIAGNOSIS — O99820 Streptococcus B carrier state complicating pregnancy: Secondary | ICD-10-CM | POA: Insufficient documentation

## 2023-04-18 DIAGNOSIS — O1403 Mild to moderate pre-eclampsia, third trimester: Secondary | ICD-10-CM | POA: Diagnosis not present

## 2023-04-18 DIAGNOSIS — Z3A33 33 weeks gestation of pregnancy: Secondary | ICD-10-CM | POA: Insufficient documentation

## 2023-04-18 DIAGNOSIS — O1493 Unspecified pre-eclampsia, third trimester: Secondary | ICD-10-CM

## 2023-04-18 DIAGNOSIS — Z79899 Other long term (current) drug therapy: Secondary | ICD-10-CM | POA: Diagnosis not present

## 2023-04-18 DIAGNOSIS — O09293 Supervision of pregnancy with other poor reproductive or obstetric history, third trimester: Secondary | ICD-10-CM | POA: Diagnosis not present

## 2023-04-18 DIAGNOSIS — O10913 Unspecified pre-existing hypertension complicating pregnancy, third trimester: Secondary | ICD-10-CM | POA: Insufficient documentation

## 2023-04-18 DIAGNOSIS — O1490 Unspecified pre-eclampsia, unspecified trimester: Secondary | ICD-10-CM

## 2023-04-18 DIAGNOSIS — M7989 Other specified soft tissue disorders: Secondary | ICD-10-CM | POA: Diagnosis present

## 2023-04-18 LAB — URINALYSIS, ROUTINE W REFLEX MICROSCOPIC
Bilirubin Urine: NEGATIVE
Glucose, UA: NEGATIVE mg/dL
Hgb urine dipstick: NEGATIVE
Ketones, ur: NEGATIVE mg/dL
Leukocytes,Ua: NEGATIVE
Nitrite: NEGATIVE
Protein, ur: NEGATIVE mg/dL
Specific Gravity, Urine: 1.01 (ref 1.005–1.030)
pH: 6 (ref 5.0–8.0)

## 2023-04-18 LAB — COMPREHENSIVE METABOLIC PANEL
ALT: 11 U/L (ref 0–44)
AST: 16 U/L (ref 15–41)
Albumin: 2.2 g/dL — ABNORMAL LOW (ref 3.5–5.0)
Alkaline Phosphatase: 82 U/L (ref 38–126)
Anion gap: 9 (ref 5–15)
BUN: 6 mg/dL (ref 6–20)
CO2: 17 mmol/L — ABNORMAL LOW (ref 22–32)
Calcium: 8.7 mg/dL — ABNORMAL LOW (ref 8.9–10.3)
Chloride: 109 mmol/L (ref 98–111)
Creatinine, Ser: 0.73 mg/dL (ref 0.44–1.00)
GFR, Estimated: >60 mL/min (ref >60)
Glucose, Bld: 80 mg/dL (ref 70–99)
Potassium: 4 mmol/L (ref 3.5–5.1)
Sodium: 135 mmol/L (ref 135–145)
Total Bilirubin: 0.4 mg/dL (ref 0.3–1.2)
Total Protein: 5.9 g/dL — ABNORMAL LOW (ref 6.5–8.1)

## 2023-04-18 LAB — CBC
HCT: 35.9 % — ABNORMAL LOW (ref 36.0–46.0)
Hemoglobin: 12.1 g/dL (ref 12.0–15.0)
MCH: 29.7 pg (ref 26.0–34.0)
MCHC: 33.7 g/dL (ref 30.0–36.0)
MCV: 88.2 fL (ref 80.0–100.0)
Platelets: 172 10*3/uL (ref 150–400)
RBC: 4.07 MIL/uL (ref 3.87–5.11)
RDW: 13.6 % (ref 11.5–15.5)
WBC: 12.8 10*3/uL — ABNORMAL HIGH (ref 4.0–10.5)
nRBC: 0.2 % (ref 0.0–0.2)

## 2023-04-18 LAB — PROTEIN / CREATININE RATIO, URINE
Creatinine, Urine: 68 mg/dL
Protein Creatinine Ratio: 0.34 mg/mg{Cre} — ABNORMAL HIGH (ref 0.00–0.15)
Total Protein, Urine: 23 mg/dL

## 2023-04-18 MED ORDER — NIFEDIPINE ER OSMOTIC RELEASE 60 MG PO TB24: 60.0000 mg | ORAL_TABLET | Freq: Every day | ORAL | 1 refills | Status: AC

## 2023-04-18 MED ORDER — NIFEDIPINE ER OSMOTIC RELEASE 30 MG PO TB24
30.0000 mg | ORAL_TABLET | Freq: Every day | ORAL | Status: DC
  Administered 2023-04-18: 30 mg via ORAL
  Filled 2023-04-18: qty 1

## 2023-04-18 NOTE — MAU Note (Signed)
.  Madison Cook is a 27 y.o. at [redacted]w[redacted]d here in MAU reporting: Pt reports she called her baby Dr and told them that her blood pressure was 153/119. Pt reports she had just taken her blood pressure 30 minutes prior. Pt reports swelling that started yesterday.   Onset of complaint: yesterday  Pain score: 3/10 headache  There were no vitals filed for this visit.    Lab orders placed from triage:  none

## 2023-04-18 NOTE — Telephone Encounter (Signed)
Called pt back per mychart request and BP reading pt sent. Pt states she has an increase in swelling and just hasn't felt good for the past few days. She did take her procardia around 12:50pm. Denies any HA, or vision changes , just states she doesn't feel well. Advised to go to MAU to be evaluated as they can do serial BP's and do lab work that will come back faster than in the office. Pt states baby is moving good, denies any VB, LOF or contractions. Will inform MAU staff pt will be heading that way.
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
## 2023-04-18 NOTE — Discharge Instructions (Signed)
Please increase your nifedipine to 60mg  daily. You can take two tablets every morning and then when you pick up a refill, it would be 60mg  pills so you can 1 tablet of those.  Try to maintain a more regular sleep/wake routine  Avoid excessive salt intake  Return to MAU for re-evaluation if experiencing a severe h/ache, not improved with rest, tylenol and PO fluids, flashes of light in your vision or right upper abdominal pain. Or if your home blood pressures are staying persistently above 160/110

## 2023-04-19 ENCOUNTER — Encounter: Payer: Medicaid Other | Admitting: Obstetrics & Gynecology

## 2023-04-19 ENCOUNTER — Other Ambulatory Visit: Payer: Medicaid Other

## 2023-04-20 ENCOUNTER — Other Ambulatory Visit (INDEPENDENT_AMBULATORY_CARE_PROVIDER_SITE_OTHER): Payer: Medicaid Other

## 2023-04-20 ENCOUNTER — Ambulatory Visit: Payer: Medicaid Other | Admitting: *Deleted

## 2023-04-20 ENCOUNTER — Ambulatory Visit: Payer: Medicaid Other | Admitting: Obstetrics and Gynecology

## 2023-04-20 ENCOUNTER — Inpatient Hospital Stay (HOSPITAL_COMMUNITY)
Admission: AD | Admit: 2023-04-20 | Discharge: 2023-04-28 | DRG: 785 | Disposition: A | Discharge status: Home / Self Care | Payer: Medicaid Other | Attending: Obstetrics and Gynecology | Admitting: Obstetrics and Gynecology

## 2023-04-20 VITALS — BP 145/95 | HR 112 | Wt 271.0 lb

## 2023-04-20 VITALS — BP 154/112

## 2023-04-20 DIAGNOSIS — O24410 Gestational diabetes mellitus in pregnancy, diet controlled: Secondary | ICD-10-CM

## 2023-04-20 DIAGNOSIS — Z8781 Personal history of (healed) traumatic fracture: Secondary | ICD-10-CM

## 2023-04-20 DIAGNOSIS — Z6841 Body Mass Index (BMI) 40.0 and over, adult: Secondary | ICD-10-CM

## 2023-04-20 DIAGNOSIS — O43123 Velamentous insertion of umbilical cord, third trimester: Secondary | ICD-10-CM | POA: Diagnosis present

## 2023-04-20 DIAGNOSIS — O24420 Gestational diabetes mellitus in childbirth, diet controlled: Secondary | ICD-10-CM | POA: Diagnosis present

## 2023-04-20 DIAGNOSIS — Z7982 Long term (current) use of aspirin: Secondary | ICD-10-CM | POA: Diagnosis not present

## 2023-04-20 DIAGNOSIS — O99284 Endocrine, nutritional and metabolic diseases complicating childbirth: Secondary | ICD-10-CM | POA: Diagnosis present

## 2023-04-20 DIAGNOSIS — O99824 Streptococcus B carrier state complicating childbirth: Secondary | ICD-10-CM | POA: Diagnosis present

## 2023-04-20 DIAGNOSIS — O34211 Maternal care for low transverse scar from previous cesarean delivery: Secondary | ICD-10-CM | POA: Diagnosis present

## 2023-04-20 DIAGNOSIS — O26893 Other specified pregnancy related conditions, third trimester: Secondary | ICD-10-CM | POA: Diagnosis present

## 2023-04-20 DIAGNOSIS — O24424 Gestational diabetes mellitus in childbirth, insulin controlled: Secondary | ICD-10-CM | POA: Diagnosis not present

## 2023-04-20 DIAGNOSIS — O0990 Supervision of high risk pregnancy, unspecified, unspecified trimester: Secondary | ICD-10-CM

## 2023-04-20 DIAGNOSIS — Z3A33 33 weeks gestation of pregnancy: Secondary | ICD-10-CM | POA: Diagnosis not present

## 2023-04-20 DIAGNOSIS — Z6791 Unspecified blood type, Rh negative: Secondary | ICD-10-CM

## 2023-04-20 DIAGNOSIS — O4423 Partial placenta previa NOS or without hemorrhage, third trimester: Secondary | ICD-10-CM | POA: Diagnosis not present

## 2023-04-20 DIAGNOSIS — O43199 Other malformation of placenta, unspecified trimester: Secondary | ICD-10-CM | POA: Diagnosis present

## 2023-04-20 DIAGNOSIS — O1092 Unspecified pre-existing hypertension complicating childbirth: Secondary | ICD-10-CM | POA: Diagnosis present

## 2023-04-20 DIAGNOSIS — Z302 Encounter for sterilization: Secondary | ICD-10-CM | POA: Diagnosis not present

## 2023-04-20 DIAGNOSIS — O34219 Maternal care for unspecified type scar from previous cesarean delivery: Secondary | ICD-10-CM | POA: Diagnosis present

## 2023-04-20 DIAGNOSIS — O1493 Unspecified pre-eclampsia, third trimester: Secondary | ICD-10-CM

## 2023-04-20 DIAGNOSIS — O26899 Other specified pregnancy related conditions, unspecified trimester: Secondary | ICD-10-CM

## 2023-04-20 DIAGNOSIS — O24419 Gestational diabetes mellitus in pregnancy, unspecified control: Secondary | ICD-10-CM | POA: Diagnosis present

## 2023-04-20 DIAGNOSIS — O1414 Severe pre-eclampsia complicating childbirth: Secondary | ICD-10-CM | POA: Diagnosis not present

## 2023-04-20 DIAGNOSIS — Z96642 Presence of left artificial hip joint: Secondary | ICD-10-CM | POA: Diagnosis present

## 2023-04-20 DIAGNOSIS — O1402 Mild to moderate pre-eclampsia, second trimester: Secondary | ICD-10-CM

## 2023-04-20 DIAGNOSIS — O1413 Severe pre-eclampsia, third trimester: Secondary | ICD-10-CM | POA: Diagnosis not present

## 2023-04-20 DIAGNOSIS — O99214 Obesity complicating childbirth: Secondary | ICD-10-CM | POA: Diagnosis present

## 2023-04-20 DIAGNOSIS — O99210 Obesity complicating pregnancy, unspecified trimester: Secondary | ICD-10-CM

## 2023-04-20 DIAGNOSIS — O114 Pre-existing hypertension with pre-eclampsia, complicating childbirth: Principal | ICD-10-CM | POA: Diagnosis present

## 2023-04-20 DIAGNOSIS — Z98891 History of uterine scar from previous surgery: Secondary | ICD-10-CM | POA: Diagnosis not present

## 2023-04-20 DIAGNOSIS — Z3A34 34 weeks gestation of pregnancy: Secondary | ICD-10-CM | POA: Diagnosis not present

## 2023-04-20 DIAGNOSIS — O1410 Severe pre-eclampsia, unspecified trimester: Secondary | ICD-10-CM | POA: Diagnosis present

## 2023-04-20 LAB — CBC
HCT: 35.5 % — ABNORMAL LOW (ref 36.0–46.0)
Hematocrit: 33.1 % — ABNORMAL LOW (ref 34.0–46.6)
Hemoglobin: 11 g/dL — ABNORMAL LOW (ref 11.1–15.9)
Hemoglobin: 11.6 g/dL — ABNORMAL LOW (ref 12.0–15.0)
MCH: 29.3 pg (ref 26.6–33.0)
MCH: 29 pg (ref 26.0–34.0)
MCHC: 33.2 g/dL (ref 31.5–35.7)
MCHC: 32.7 g/dL (ref 30.0–36.0)
MCV: 88 fL (ref 79–97)
MCV: 88.8 fL (ref 80.0–100.0)
Platelets: 172 10*3/uL (ref 150–450)
Platelets: 190 10*3/uL (ref 150–400)
RBC: 3.76 x10E6/uL — ABNORMAL LOW (ref 3.77–5.28)
RBC: 4 MIL/uL (ref 3.87–5.11)
RDW: 13.1 % (ref 11.7–15.4)
RDW: 13.8 % (ref 11.5–15.5)
WBC: 13.3 10*3/uL — ABNORMAL HIGH (ref 3.4–10.8)
WBC: 13.9 10*3/uL — ABNORMAL HIGH (ref 4.0–10.5)
nRBC: 0 % (ref 0.0–0.2)

## 2023-04-20 LAB — COMPREHENSIVE METABOLIC PANEL
ALT: 10 IU/L (ref 0–32)
ALT: 12 U/L (ref 0–44)
AST: 17 IU/L (ref 0–40)
AST: 22 U/L (ref 15–41)
Albumin: 2.9 g/dL — ABNORMAL LOW (ref 4.0–5.0)
Albumin: 2.2 g/dL — ABNORMAL LOW (ref 3.5–5.0)
Alkaline Phosphatase: 96 IU/L (ref 44–121)
Alkaline Phosphatase: 82 U/L (ref 38–126)
Anion gap: 11 (ref 5–15)
BUN/Creatinine Ratio: 8 — ABNORMAL LOW (ref 9–23)
BUN: 6 mg/dL (ref 6–20)
BUN: 7 mg/dL (ref 6–20)
Bilirubin Total: <0.2 mg/dL (ref 0.0–1.2)
CO2: 19 mmol/L — ABNORMAL LOW (ref 20–29)
CO2: 20 mmol/L — ABNORMAL LOW (ref 22–32)
Calcium: 9 mg/dL (ref 8.7–10.2)
Calcium: 9.4 mg/dL (ref 8.9–10.3)
Chloride: 104 mmol/L (ref 96–106)
Chloride: 104 mmol/L (ref 98–111)
Creatinine, Ser: 0.73 mg/dL (ref 0.57–1.00)
Creatinine, Ser: 0.75 mg/dL (ref 0.44–1.00)
GFR, Estimated: >60 mL/min (ref >60)
Globulin, Total: 2.6 g/dL (ref 1.5–4.5)
Glucose, Bld: 121 mg/dL — ABNORMAL HIGH (ref 70–99)
Glucose: 95 mg/dL (ref 70–99)
Potassium: 3.9 mmol/L (ref 3.5–5.2)
Potassium: 3.4 mmol/L — ABNORMAL LOW (ref 3.5–5.1)
Sodium: 139 mmol/L (ref 134–144)
Sodium: 135 mmol/L (ref 135–145)
Total Bilirubin: 0.4 mg/dL (ref 0.3–1.2)
Total Protein: 5.5 g/dL — ABNORMAL LOW (ref 6.0–8.5)
Total Protein: 6 g/dL — ABNORMAL LOW (ref 6.5–8.1)
eGFR: 117 mL/min/{1.73_m2} (ref >59)

## 2023-04-20 LAB — GLUCOSE, CAPILLARY: Glucose-Capillary: 116 mg/dL — ABNORMAL HIGH (ref 70–99)

## 2023-04-20 LAB — TYPE AND SCREEN
ABO/RH(D): A NEG
Antibody Screen: POSITIVE

## 2023-04-20 MED ORDER — LACTATED RINGERS IV SOLN: 125.0000 mL/h | INTRAVENOUS | Status: DC

## 2023-04-20 MED ORDER — PROMETHAZINE HCL 25 MG PO TABS: 25.0000 mg | ORAL_TABLET | Freq: Four times a day (QID) | ORAL | Status: DC | PRN

## 2023-04-20 MED ORDER — DOCUSATE SODIUM 100 MG PO CAPS
100.0000 mg | ORAL_CAPSULE | Freq: Every day | ORAL | Status: DC
  Administered 2023-04-21 – 2023-04-22 (×2): 100 mg via ORAL
  Filled 2023-04-20 (×2): qty 1

## 2023-04-20 MED ORDER — ACETAMINOPHEN 325 MG PO TABS
650.0000 mg | ORAL_TABLET | ORAL | Status: DC | PRN
  Administered 2023-04-21 – 2023-04-23 (×3): 650 mg via ORAL
  Filled 2023-04-20 (×3): qty 2

## 2023-04-20 MED ORDER — SODIUM CHLORIDE 0.9 % IV SOLN: 250.0000 mL | INTRAVENOUS | Status: DC | PRN

## 2023-04-20 MED ORDER — LABETALOL HCL 5 MG/ML IV SOLN: 40.0000 mg | INTRAVENOUS | Status: DC | PRN

## 2023-04-20 MED ORDER — LABETALOL HCL 5 MG/ML IV SOLN: 80.0000 mg | INTRAVENOUS | Status: DC | PRN

## 2023-04-20 MED ORDER — CALCIUM CARBONATE ANTACID 500 MG PO CHEW
2.0000 | CHEWABLE_TABLET | ORAL | Status: DC | PRN
  Administered 2023-04-20 – 2023-04-22 (×6): 400 mg via ORAL
  Filled 2023-04-20 (×6): qty 2

## 2023-04-20 MED ORDER — SODIUM CHLORIDE 0.9% FLUSH: 3.0000 mL | INTRAVENOUS | Status: DC | PRN

## 2023-04-20 MED ORDER — NIFEDIPINE ER OSMOTIC RELEASE 60 MG PO TB24
60.0000 mg | ORAL_TABLET | Freq: Every day | ORAL | Status: DC
  Administered 2023-04-20: 60 mg via ORAL
  Filled 2023-04-20 (×3): qty 1

## 2023-04-20 MED ORDER — PRENATAL MULTIVITAMIN CH
1.0000 | ORAL_TABLET | Freq: Every day | ORAL | Status: DC
  Administered 2023-04-21 – 2023-04-22 (×2): 1 via ORAL
  Filled 2023-04-20 (×2): qty 1

## 2023-04-20 MED ORDER — INSULIN ASPART 100 UNIT/ML IJ SOLN
0.0000 [IU] | Freq: Three times a day (TID) | INTRAMUSCULAR | Status: DC
  Administered 2023-04-20 – 2023-04-21 (×2): 1 [IU] via SUBCUTANEOUS
  Administered 2023-04-21 (×2): 2 [IU] via SUBCUTANEOUS
  Administered 2023-04-22 (×2): 1 [IU] via SUBCUTANEOUS
  Administered 2023-04-23: 2 [IU] via SUBCUTANEOUS
  Administered 2023-04-24 (×3): 1 [IU] via SUBCUTANEOUS

## 2023-04-20 MED ORDER — SERTRALINE HCL 50 MG PO TABS
100.0000 mg | ORAL_TABLET | Freq: Every day | ORAL | Status: DC
  Administered 2023-04-20 – 2023-04-28 (×8): 100 mg via ORAL
  Filled 2023-04-20 (×2): qty 2
  Filled 2023-04-20 (×3): qty 1
  Filled 2023-04-20 (×4): qty 2
  Filled 2023-04-20: qty 1

## 2023-04-20 MED ORDER — ASPIRIN 81 MG PO TBEC
81.0000 mg | DELAYED_RELEASE_TABLET | Freq: Every day | ORAL | Status: DC
  Administered 2023-04-21 – 2023-04-22 (×2): 81 mg via ORAL
  Filled 2023-04-20 (×3): qty 1

## 2023-04-20 MED ORDER — HYDRALAZINE HCL 20 MG/ML IJ SOLN: 10.0000 mg | INTRAMUSCULAR | Status: DC | PRN

## 2023-04-20 MED ORDER — SERTRALINE HCL 50 MG PO TABS: 100.0000 mg | ORAL_TABLET | Freq: Two times a day (BID) | ORAL | Status: DC

## 2023-04-20 MED ORDER — LABETALOL HCL 5 MG/ML IV SOLN
20.0000 mg | INTRAVENOUS | Status: DC | PRN
  Administered 2023-04-27: 20 mg via INTRAVENOUS
  Filled 2023-04-20: qty 4

## 2023-04-20 MED ORDER — LORATADINE 10 MG PO TABS
10.0000 mg | ORAL_TABLET | Freq: Every day | ORAL | Status: DC
  Administered 2023-04-20 – 2023-04-24 (×5): 10 mg via ORAL
  Filled 2023-04-20 (×7): qty 1

## 2023-04-20 MED ORDER — SODIUM CHLORIDE 0.9% FLUSH
3.0000 mL | Freq: Two times a day (BID) | INTRAVENOUS | Status: DC
  Administered 2023-04-20 – 2023-04-22 (×5): 3 mL via INTRAVENOUS

## 2023-04-20 MED ORDER — POTASSIUM CHLORIDE CRYS ER 20 MEQ PO TBCR
40.0000 meq | EXTENDED_RELEASE_TABLET | Freq: Every day | ORAL | Status: AC
  Administered 2023-04-21 – 2023-04-22 (×2): 40 meq via ORAL
  Filled 2023-04-20 (×3): qty 2

## 2023-04-20 MED ORDER — TRAZODONE HCL 50 MG PO TABS
50.0000 mg | ORAL_TABLET | Freq: Every day | ORAL | Status: DC
  Administered 2023-04-20 – 2023-04-23 (×4): 50 mg via ORAL
  Filled 2023-04-20 (×7): qty 1

## 2023-04-20 MED ORDER — BETAMETHASONE SOD PHOS & ACET 6 (3-3) MG/ML IJ SUSP
12.0000 mg | INTRAMUSCULAR | Status: AC
  Administered 2023-04-20 – 2023-04-21 (×2): 12 mg via INTRAMUSCULAR
  Filled 2023-04-20 (×2): qty 5

## 2023-04-20 MED ORDER — TAMSULOSIN HCL 0.4 MG PO CAPS: 0.4000 mg | ORAL_CAPSULE | Freq: Every day | ORAL | Status: DC | PRN

## 2023-04-20 NOTE — MAU Note (Addendum)
Upon speaking with patient, patient reports she was supposed to be direct admitted to [facility]   Sharen Counter, CNM, informed of this information. CNM to speak with MD. CNM stated to hold off on starting the patient in MAU as the patient will likely be a direct admission to [facility].
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
## 2023-04-20 NOTE — Patient Instructions (Signed)
Continue to check your blood pressures twice a day Call the office, during office hours, or come to the hospital (off hours) for blood pressures that are consistently above 155 for the top number or 105 for the bottom number     Hypertension During Pregnancy Hypertension is also called high blood pressure. High blood pressure means that the force of your blood moving in your body is too strong. It can cause problems for you and your baby. Different types of high blood pressure can happen during pregnancy. The types are: High blood pressure before you got pregnant. This is called chronic hypertension.  This can continue during your pregnancy. Your doctor will want to keep checking your blood pressure. You may need medicine to keep your blood pressure under control while you are pregnant. You will need follow-up visits after you have your baby. High blood pressure that goes up during pregnancy when it was normal before. This is called gestational hypertension. It will usually get better after you have your baby, but your doctor will need to watch your blood pressure to make sure that it is getting better. Very high blood pressure during pregnancy. This is called preeclampsia. Very high blood pressure is an emergency that needs to be checked and treated right away. You may develop very high blood pressure after giving birth. This is called postpartum preeclampsia. This usually occurs within 48 hours after childbirth but may occur up to 6 weeks after giving birth. This is rare. How does this affect me? If you have high blood pressure during pregnancy, you have a higher chance of developing high blood pressure: As you get older. If you get pregnant again. In some cases, high blood pressure during pregnancy can cause: Stroke. Heart attack. Damage to the kidneys, lungs, or liver. Preeclampsia. Jerky movements you cannot control (convulsions or seizures). Problems with the placenta.  What can I do to  lower my risk?  Keep a healthy weight. Eat a healthy diet. Follow what your doctor tells you about treating any medical problems that you had before becoming pregnant. It is very important to go to all of your doctor visits. Your doctor will check your blood pressure and make sure that your pregnancy is progressing as it should. Treatment should start early if a problem is found.  Follow these instructions at home:  Take your blood pressure 1-2 times per day. Call the office if your blood pressure is 155 or higher for the top number or 105 or higher for the bottom number.    Eating and drinking  Drink enough fluid to keep your pee (urine) pale yellow. Avoid caffeine. Lifestyle Do not use any products that contain nicotine or tobacco, such as cigarettes, e-cigarettes, and chewing tobacco. If you need help quitting, ask your doctor. Do not use alcohol or drugs. Avoid stress. Rest and get plenty of sleep. Regular exercise can help. Ask your doctor what kinds of exercise are best for you. General instructions Take over-the-counter and prescription medicines only as told by your doctor. Keep all prenatal and follow-up visits as told by your doctor. This is important. Contact a doctor if: You have symptoms that your doctor told you to watch for, such as: Headaches. Nausea. Vomiting. Belly (abdominal) pain. Dizziness. Light-headedness. Get help right away if: You have: Very bad belly pain that does not get better with treatment. A very bad headache that does not get better. Vomiting that does not get better. Sudden, fast weight gain. Sudden swelling in your  hands, ankles, or face. Blood in your pee. Blurry vision. Double vision. Shortness of breath. Chest pain. Weakness on one side of your body. Trouble talking. Summary High blood pressure is also called hypertension. High blood pressure means that the force of your blood moving in your body is too strong. High blood  pressure can cause problems for you and your baby. Keep all follow-up visits as told by your doctor. This is important. This information is not intended to replace advice given to you by your health care provider. Make sure you discuss any questions you have with your health care provider. Document Released: 10/22/2010 Document Revised: 01/10/2019 Document Reviewed: 10/16/2018 Elsevier Patient Education  2020 [employer].

## 2023-04-20 NOTE — MAU Note (Signed)
MD reports patient is to be admitted to [facility].

## 2023-04-20 NOTE — Progress Notes (Signed)
Patient informed that the ultrasound is considered a limited obstetric ultrasound and is not intended to be a complete ultrasound exam.  Patient also informed that the ultrasound is not being completed with the intent of assessing for fetal or placental anomalies or any pelvic abnormalities. Explained that the purpose of today's ultrasound is to assess for fetal well being.  Patient acknowledges the purpose of the exam and the limitations of the study.      Christine Soliz, RN      

## 2023-04-20 NOTE — MAU Note (Signed)
.  Madison Cook is a 27 y.o. at [redacted]w[redacted]d here in MAU reporting: b/p elevated in office. Evaluated for PIH 2 days ago. Reports headache  and some blurred vision. Good fetal movement felt.  LMP:  Onset of complaint: today Pain score: 4-5 Vitals:   04/20/23 1734  BP: (!) 149/97  Pulse: (!) 112  Resp: 18  Temp: 98.1 F (36.7 C)     FHT:149 Lab orders placed from triage:
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
## 2023-04-21 ENCOUNTER — Other Ambulatory Visit: Payer: Self-pay

## 2023-04-21 ENCOUNTER — Encounter (HOSPITAL_COMMUNITY): Payer: Self-pay | Admitting: Obstetrics and Gynecology

## 2023-04-21 DIAGNOSIS — O1413 Severe pre-eclampsia, third trimester: Secondary | ICD-10-CM

## 2023-04-21 DIAGNOSIS — O1410 Severe pre-eclampsia, unspecified trimester: Secondary | ICD-10-CM | POA: Diagnosis present

## 2023-04-21 DIAGNOSIS — O24410 Gestational diabetes mellitus in pregnancy, diet controlled: Secondary | ICD-10-CM

## 2023-04-21 DIAGNOSIS — O1493 Unspecified pre-eclampsia, third trimester: Secondary | ICD-10-CM

## 2023-04-21 DIAGNOSIS — Z3A33 33 weeks gestation of pregnancy: Secondary | ICD-10-CM

## 2023-04-21 LAB — GLUCOSE, CAPILLARY
Glucose-Capillary: 105 mg/dL — ABNORMAL HIGH (ref 70–99)
Glucose-Capillary: 106 mg/dL — ABNORMAL HIGH (ref 70–99)
Glucose-Capillary: 122 mg/dL — ABNORMAL HIGH (ref 70–99)
Glucose-Capillary: 122 mg/dL — ABNORMAL HIGH (ref 70–99)
Glucose-Capillary: 136 mg/dL — ABNORMAL HIGH (ref 70–99)

## 2023-04-21 MED ORDER — NIFEDIPINE ER OSMOTIC RELEASE 60 MG PO TB24
90.0000 mg | ORAL_TABLET | Freq: Every day | ORAL | Status: DC
  Administered 2023-04-21 – 2023-04-24 (×4): 90 mg via ORAL
  Filled 2023-04-21 (×2): qty 3
  Filled 2023-04-21: qty 1

## 2023-04-21 MED ORDER — NIFEDIPINE ER OSMOTIC RELEASE 30 MG PO TB24
ORAL_TABLET | ORAL | Status: AC
  Filled 2023-04-21: qty 1

## 2023-04-21 MED ORDER — OXYCODONE HCL 5 MG PO TABS
10.0000 mg | ORAL_TABLET | Freq: Once | ORAL | Status: AC
  Administered 2023-04-21: 10 mg via ORAL
  Filled 2023-04-21: qty 2

## 2023-04-21 MED ORDER — METOCLOPRAMIDE HCL 5 MG/ML IJ SOLN
10.0000 mg | Freq: Once | INTRAMUSCULAR | Status: AC
  Administered 2023-04-21: 10 mg via INTRAVENOUS
  Filled 2023-04-21: qty 2

## 2023-04-21 MED ORDER — HYDROXYZINE HCL 50 MG PO TABS
25.0000 mg | ORAL_TABLET | ORAL | Status: DC | PRN
  Administered 2023-04-22 – 2023-04-25 (×10): 25 mg via ORAL
  Filled 2023-04-21 (×10): qty 1
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
## 2023-04-21 NOTE — Progress Notes (Signed)
   04/21/23 4098  Spiritual Encounters  Type of Visit Initial  Care provided to: Patient  Referral source Clinical staff  Reason for visit Advance directives  OnCall Visit No   Chaplain responded to a consult requesting to update or create Advanced Care Directives. Chaplain visited Pt in her room. Chaplain provided education about how to fill the form, the information needed and instructions about how to proceed after Pt finishes filling out the form with the basic information. Chaplain requested Pt to call the Chaplain's office to finalize the document by notarizing it in forn of witnesses, at an appropriate time. Chaplain remains available in case needed.

## 2023-04-21 NOTE — Consult Note (Signed)
[facility]  Prenatal Consult       04/21/2023  8:01 AM   I was asked by Dr.Ozan to consult on this patient for possible preterm delivery. I had the pleasure of meeting with Madison Cook today with her mother and sister on FaceTime. She is a 27 year old [id number] currently at [redacted]w[redacted]d. Pregnancy complicated by pre-eclampsia, now severe, and A1GDM. She is expecting a baby girl, to be named Lexicographer. She has two other daughters at home, ages 36 and 3 years, who were born late preterm but did not require NICU stays. Father of the baby is not involved but she has an extensive support network with her family. Plan is for IOL at 34 weeks, TOLAC attempt.  I explained that the neonatal intensive care team would be present for the delivery and outlined the likely delivery room course for this baby including routine resuscitation and NRP-guided approaches to the treatment of respiratory distress. We discussed other common problems associated with prematurity including respiratory distress syndrome/CLD, apnea, feeding issues, temperature regulation, and infection risk. We briefly discussed the typical range of longterm developmental outcomes.  We discussed the average length of stay but I noted that the actual LOS would depend on the severity of problems encountered and response to treatments. We discussed visitation policies and the resources available while her child is in the hospital.  We discussed the importance of good nutrition and various methods of providing nutrition (parenteral hyperalimentation, gavage feedings and/or oral feeding). We discussed the benefits of human milk. Mother intends to breast feed and had good milk supply with her other daughters. I encouraged breast feeding and pumping soon after birth and outlined resources that are available to support breast feeding. We discussed the possibility of using donor breast milk as a bridge and she prefers this - she wants to avoid  formula if at all possible.  Thank you for involving [place] in the care of this patient. A member of our team will be available should the family have additional questions. Time for consultation: approximately 20 minutes of face-to-face time in discussion of the risks and medical care associated with preterm delivery.  Jacob Moores, MD Neonatal Medicine

## 2023-04-22 ENCOUNTER — Encounter (HOSPITAL_COMMUNITY): Payer: Self-pay | Admitting: Obstetrics & Gynecology

## 2023-04-22 LAB — GLUCOSE, CAPILLARY
Glucose-Capillary: 112 mg/dL — ABNORMAL HIGH (ref 70–99)
Glucose-Capillary: 111 mg/dL — ABNORMAL HIGH (ref 70–99)
Glucose-Capillary: 106 mg/dL — ABNORMAL HIGH (ref 70–99)

## 2023-04-22 MED ORDER — FAMOTIDINE 20 MG PO TABS
20.0000 mg | ORAL_TABLET | Freq: Two times a day (BID) | ORAL | Status: DC
  Administered 2023-04-22: 20 mg via ORAL
  Filled 2023-04-22: qty 1

## 2023-04-22 NOTE — Plan of Care (Signed)
  Problem: Education: Goal: Knowledge of disease or condition will improve Outcome: Progressing Goal: Knowledge of the prescribed therapeutic regimen will improve Outcome: Progressing Goal: Individualized Educational Video(s) Outcome: Progressing   Problem: Clinical Measurements: Goal: Complications related to the disease process, condition or treatment will be avoided or minimized Outcome: Progressing   Problem: Education: Goal: Ability to describe self-care measures that may prevent or decrease complications (Diabetes Survival Skills Education) will improve Outcome: Progressing Goal: Individualized Educational Video(s) Outcome: Progressing   Problem: Coping: Goal: Ability to adjust to condition or change in health will improve Outcome: Progressing   Problem: Fluid Volume: Goal: Ability to maintain a balanced intake and output will improve Outcome: Progressing   Problem: Health Behavior/Discharge Planning: Goal: Ability to identify and utilize available resources and services will improve Outcome: Progressing Goal: Ability to manage health-related needs will improve Outcome: Progressing   Problem: Metabolic: Goal: Ability to maintain appropriate glucose levels will improve Outcome: Progressing   Problem: Nutritional: Goal: Maintenance of adequate nutrition will improve Outcome: Progressing Goal: Progress toward achieving an optimal weight will improve Outcome: Progressing   Problem: Skin Integrity: Goal: Risk for impaired skin integrity will decrease Outcome: Progressing   Problem: Tissue Perfusion: Goal: Adequacy of tissue perfusion will improve Outcome: Progressing   Problem: Education: Goal: Knowledge of General Education information will improve Description: Including pain rating scale, medication(s)/side effects and non-pharmacologic comfort measures Outcome: Progressing   Problem: Health Behavior/Discharge Planning: Goal: Ability to manage health-related  needs will improve Outcome: Progressing   Problem: Clinical Measurements: Goal: Ability to maintain clinical measurements within normal limits will improve Outcome: Progressing Goal: Will remain free from infection Outcome: Progressing Goal: Diagnostic test results will improve Outcome: Progressing Goal: Respiratory complications will improve Outcome: Progressing Goal: Cardiovascular complication will be avoided Outcome: Progressing   Problem: Activity: Goal: Risk for activity intolerance will decrease Outcome: Progressing   Problem: Nutrition: Goal: Adequate nutrition will be maintained Outcome: Progressing   Problem: Coping: Goal: Level of anxiety will decrease Outcome: Progressing   Problem: Elimination: Goal: Will not experience complications related to bowel motility Outcome: Progressing Goal: Will not experience complications related to urinary retention Outcome: Progressing   Problem: Pain Managment: Goal: General experience of comfort will improve Outcome: Progressing   Problem: Safety: Goal: Ability to remain free from injury will improve Outcome: Progressing   Problem: Skin Integrity: Goal: Risk for impaired skin integrity will decrease Outcome: Progressing   Problem: Education: Goal: Knowledge of disease or condition will improve Outcome: Progressing Goal: Knowledge of the prescribed therapeutic regimen will improve Outcome: Progressing   Problem: Fluid Volume: Goal: Peripheral tissue perfusion will improve Outcome: Progressing   Problem: Clinical Measurements: Goal: Complications related to disease process, condition or treatment will be avoided or minimized Outcome: Progressing
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
## 2023-04-22 NOTE — Progress Notes (Signed)
Maternal-Fetal Medicine  G4 [phone] at 33w 6d gestation admitted with preeclampsia with severe features. Patient reports she still has headaches but no visual disturbances or right upper quadrant pain or vaginal bleeding.  She takes nifedipine XL 90 mg daily.  Blood pressure range 122-148/64-89 mmHg. Labs including liver enzymes and platelets are within normal range.  P/E: Patient is comfortably lying in bed; not in distress. Pulse 98/minute. Abdomen: Soft gravid uterus; no tenderness. NST reactive.  We suspect she has preeclampsia with severe features based on her increased blood pressures at home and her symptoms.  Patient would like to be delivered at [redacted] weeks gestation.  Delivery is planned for tomorrow.  Patient is requesting bilateral tubal sterilization and will discuss with our Ob provider.

## 2023-04-22 NOTE — Progress Notes (Signed)
Signed state tubal consent papers today, scanned to chart.  Baldemar Lenis, MD, [facility] Attending Center for [employer] [facility])

## 2023-04-23 ENCOUNTER — Inpatient Hospital Stay (HOSPITAL_COMMUNITY): Payer: Medicaid Other | Attending: Obstetrics & Gynecology

## 2023-04-23 ENCOUNTER — Inpatient Hospital Stay (HOSPITAL_COMMUNITY)
Admission: AD | Admit: 2023-04-23 | Payer: Medicaid Other | Source: Home / Self Care | Admitting: Obstetrics & Gynecology

## 2023-04-23 ENCOUNTER — Encounter (HOSPITAL_COMMUNITY): Payer: Self-pay | Admitting: Obstetrics & Gynecology

## 2023-04-23 LAB — GLUCOSE, CAPILLARY
Glucose-Capillary: 127 mg/dL — ABNORMAL HIGH (ref 70–99)
Glucose-Capillary: 120 mg/dL — ABNORMAL HIGH (ref 70–99)
Glucose-Capillary: 81 mg/dL (ref 70–99)
Glucose-Capillary: 106 mg/dL — ABNORMAL HIGH (ref 70–99)
Glucose-Capillary: 85 mg/dL (ref 70–99)
Glucose-Capillary: 128 mg/dL — ABNORMAL HIGH (ref 70–99)

## 2023-04-23 LAB — COMPREHENSIVE METABOLIC PANEL
ALT: 12 U/L (ref 0–44)
ALT: 13 U/L (ref 0–44)
AST: 19 U/L (ref 15–41)
AST: 22 U/L (ref 15–41)
Albumin: 2.3 g/dL — ABNORMAL LOW (ref 3.5–5.0)
Albumin: 2.4 g/dL — ABNORMAL LOW (ref 3.5–5.0)
Alkaline Phosphatase: 75 U/L (ref 38–126)
Alkaline Phosphatase: 85 U/L (ref 38–126)
Anion gap: 8 (ref 5–15)
Anion gap: 10 (ref 5–15)
BUN: 11 mg/dL (ref 6–20)
BUN: 7 mg/dL (ref 6–20)
CO2: 19 mmol/L — ABNORMAL LOW (ref 22–32)
CO2: 18 mmol/L — ABNORMAL LOW (ref 22–32)
Calcium: 8.6 mg/dL — ABNORMAL LOW (ref 8.9–10.3)
Calcium: 7.5 mg/dL — ABNORMAL LOW (ref 8.9–10.3)
Chloride: 108 mmol/L (ref 98–111)
Chloride: 105 mmol/L (ref 98–111)
Creatinine, Ser: 0.85 mg/dL (ref 0.44–1.00)
Creatinine, Ser: 0.71 mg/dL (ref 0.44–1.00)
GFR, Estimated: >60 mL/min (ref >60)
GFR, Estimated: >60 mL/min (ref >60)
Glucose, Bld: 121 mg/dL — ABNORMAL HIGH (ref 70–99)
Glucose, Bld: 93 mg/dL (ref 70–99)
Potassium: 3.8 mmol/L (ref 3.5–5.1)
Potassium: 4.2 mmol/L (ref 3.5–5.1)
Sodium: 135 mmol/L (ref 135–145)
Sodium: 133 mmol/L — ABNORMAL LOW (ref 135–145)
Total Bilirubin: <0.1 mg/dL — ABNORMAL LOW (ref 0.3–1.2)
Total Bilirubin: <0.1 mg/dL — ABNORMAL LOW (ref 0.3–1.2)
Total Protein: 6 g/dL — ABNORMAL LOW (ref 6.5–8.1)
Total Protein: 6.3 g/dL — ABNORMAL LOW (ref 6.5–8.1)

## 2023-04-23 LAB — CBC
HCT: 36.3 % (ref 36.0–46.0)
HCT: 37.4 % (ref 36.0–46.0)
Hemoglobin: 11.3 g/dL — ABNORMAL LOW (ref 12.0–15.0)
Hemoglobin: 11.8 g/dL — ABNORMAL LOW (ref 12.0–15.0)
MCH: 28.7 pg (ref 26.0–34.0)
MCH: 28.9 pg (ref 26.0–34.0)
MCHC: 31.1 g/dL (ref 30.0–36.0)
MCHC: 31.6 g/dL (ref 30.0–36.0)
MCV: 92.1 fL (ref 80.0–100.0)
MCV: 91.4 fL (ref 80.0–100.0)
Platelets: 194 10*3/uL (ref 150–400)
Platelets: 192 10*3/uL (ref 150–400)
RBC: 3.94 MIL/uL (ref 3.87–5.11)
RBC: 4.09 MIL/uL (ref 3.87–5.11)
RDW: 13.9 % (ref 11.5–15.5)
RDW: 14 % (ref 11.5–15.5)
WBC: 22.1 10*3/uL — ABNORMAL HIGH (ref 4.0–10.5)
WBC: 26.1 10*3/uL — ABNORMAL HIGH (ref 4.0–10.5)
nRBC: 0.6 % — ABNORMAL HIGH (ref 0.0–0.2)
nRBC: 0.4 % — ABNORMAL HIGH (ref 0.0–0.2)

## 2023-04-23 LAB — TYPE AND SCREEN
ABO/RH(D): A NEG
Antibody Screen: POSITIVE

## 2023-04-23 LAB — MAGNESIUM: Magnesium: 5.2 mg/dL — ABNORMAL HIGH (ref 1.7–2.4)

## 2023-04-23 LAB — RPR: RPR Ser Ql: NONREACTIVE

## 2023-04-23 MED ORDER — LACTATED RINGERS IV SOLN: INTRAVENOUS | Status: DC

## 2023-04-23 MED ORDER — PENICILLIN G POT IN DEXTROSE 60000 UNIT/ML IV SOLN
3.0000 10*6.[IU] | INTRAVENOUS | Status: DC
  Administered 2023-04-23 – 2023-04-25 (×12): 3 10*6.[IU] via INTRAVENOUS
  Filled 2023-04-23 (×12): qty 50

## 2023-04-23 MED ORDER — MAGNESIUM SULFATE 40 GM/1000ML IV SOLN
2.0000 g/h | INTRAVENOUS | Status: DC
  Administered 2023-04-23 – 2023-04-24 (×3): 2 g/h via INTRAVENOUS
  Filled 2023-04-23 (×3): qty 1000

## 2023-04-23 MED ORDER — METOCLOPRAMIDE HCL 5 MG/ML IJ SOLN
INTRAMUSCULAR | Status: AC
  Administered 2023-04-23: 10 mg via INTRAVENOUS
  Filled 2023-04-23: qty 2

## 2023-04-23 MED ORDER — TERBUTALINE SULFATE 1 MG/ML IJ SOLN: 0.2500 mg | Freq: Once | INTRAMUSCULAR | Status: DC | PRN

## 2023-04-23 MED ORDER — MAGNESIUM SULFATE BOLUS VIA INFUSION
4.0000 g | Freq: Once | INTRAVENOUS | Status: AC
  Administered 2023-04-23: 4 g via INTRAVENOUS
  Filled 2023-04-23: qty 1000

## 2023-04-23 MED ORDER — LACTATED RINGERS IV SOLN
500.0000 mL | INTRAVENOUS | Status: DC | PRN
  Administered 2023-04-24 (×3): 500 mL via INTRAVENOUS

## 2023-04-23 MED ORDER — SODIUM CHLORIDE 0.9 % IV SOLN
5.0000 10*6.[IU] | Freq: Once | INTRAVENOUS | Status: AC
  Administered 2023-04-23: 5 10*6.[IU] via INTRAVENOUS
  Filled 2023-04-23: qty 5

## 2023-04-23 MED ORDER — OXYTOCIN BOLUS FROM INFUSION: 333.0000 mL | Freq: Once | INTRAVENOUS | Status: DC

## 2023-04-23 MED ORDER — BUTALBITAL-APAP-CAFFEINE 50-325-40 MG PO TABS
1.0000 | ORAL_TABLET | Freq: Four times a day (QID) | ORAL | Status: DC | PRN
  Administered 2023-04-23: 1 via ORAL
  Filled 2023-04-23: qty 1

## 2023-04-23 MED ORDER — SOD CITRATE-CITRIC ACID 500-334 MG/5ML PO SOLN
30.0000 mL | ORAL | Status: DC | PRN
  Administered 2023-04-25: 30 mL via ORAL
  Filled 2023-04-23: qty 30

## 2023-04-23 MED ORDER — ACETAMINOPHEN-CAFFEINE 500-65 MG PO TABS
1.0000 | ORAL_TABLET | Freq: Once | ORAL | Status: AC
  Administered 2023-04-23: 1 via ORAL
  Filled 2023-04-23: qty 1

## 2023-04-23 MED ORDER — OXYTOCIN-SODIUM CHLORIDE 30-0.9 UT/500ML-% IV SOLN: 2.5000 [IU]/h | INTRAVENOUS | Status: DC

## 2023-04-23 MED ORDER — ONDANSETRON HCL 4 MG/2ML IJ SOLN
4.0000 mg | Freq: Four times a day (QID) | INTRAMUSCULAR | Status: DC | PRN
  Administered 2023-04-24 (×2): 4 mg via INTRAVENOUS
  Filled 2023-04-23 (×2): qty 2

## 2023-04-23 MED ORDER — LIDOCAINE HCL (PF) 1 % IJ SOLN: 30.0000 mL | INTRAMUSCULAR | Status: DC | PRN

## 2023-04-23 MED ORDER — CYCLOBENZAPRINE HCL 5 MG PO TABS
5.0000 mg | ORAL_TABLET | Freq: Once | ORAL | Status: AC
  Administered 2023-04-23: 5 mg via ORAL
  Filled 2023-04-23: qty 1

## 2023-04-23 MED ORDER — FENTANYL CITRATE (PF) 100 MCG/2ML IJ SOLN
50.0000 ug | INTRAMUSCULAR | Status: DC | PRN
  Administered 2023-04-23 – 2023-04-24 (×4): 100 ug via INTRAVENOUS
  Filled 2023-04-23 (×4): qty 2

## 2023-04-23 MED ORDER — ACETAMINOPHEN 325 MG PO TABS: 650.0000 mg | ORAL_TABLET | ORAL | Status: DC | PRN

## 2023-04-23 MED ORDER — OXYCODONE HCL 5 MG PO TABS
10.0000 mg | ORAL_TABLET | ORAL | Status: DC | PRN
  Administered 2023-04-23 – 2023-04-24 (×2): 10 mg via ORAL
  Filled 2023-04-23 (×2): qty 2

## 2023-04-23 MED ORDER — OXYTOCIN-SODIUM CHLORIDE 30-0.9 UT/500ML-% IV SOLN
1.0000 m[IU]/min | INTRAVENOUS | Status: DC
  Administered 2023-04-23: 1 m[IU]/min via INTRAVENOUS
  Filled 2023-04-23: qty 500

## 2023-04-23 MED ORDER — METOCLOPRAMIDE HCL 5 MG/ML IJ SOLN: 10.0000 mg | Freq: Once | INTRAMUSCULAR | Status: AC

## 2023-04-23 NOTE — Plan of Care (Signed)
  Problem: Education: Goal: Knowledge of disease or condition will improve Outcome: Progressing Goal: Knowledge of the prescribed therapeutic regimen will improve Outcome: Progressing Goal: Individualized Educational Video(s) Outcome: Progressing   Problem: Clinical Measurements: Goal: Complications related to the disease process, condition or treatment will be avoided or minimized Outcome: Progressing   Problem: Education: Goal: Ability to describe self-care measures that may prevent or decrease complications (Diabetes Survival Skills Education) will improve Outcome: Progressing Goal: Individualized Educational Video(s) Outcome: Progressing   Problem: Coping: Goal: Ability to adjust to condition or change in health will improve Outcome: Progressing   Problem: Fluid Volume: Goal: Ability to maintain a balanced intake and output will improve Outcome: Progressing   Problem: Health Behavior/Discharge Planning: Goal: Ability to identify and utilize available resources and services will improve Outcome: Progressing Goal: Ability to manage health-related needs will improve Outcome: Progressing   Problem: Metabolic: Goal: Ability to maintain appropriate glucose levels will improve Outcome: Progressing   Problem: Nutritional: Goal: Maintenance of adequate nutrition will improve Outcome: Progressing Goal: Progress toward achieving an optimal weight will improve Outcome: Progressing   Problem: Skin Integrity: Goal: Risk for impaired skin integrity will decrease Outcome: Progressing   Problem: Tissue Perfusion: Goal: Adequacy of tissue perfusion will improve Outcome: Progressing   Problem: Education: Goal: Knowledge of General Education information will improve Description: Including pain rating scale, medication(s)/side effects and non-pharmacologic comfort measures Outcome: Progressing   Problem: Health Behavior/Discharge Planning: Goal: Ability to manage health-related  needs will improve Outcome: Progressing   Problem: Clinical Measurements: Goal: Ability to maintain clinical measurements within normal limits will improve Outcome: Progressing Goal: Will remain free from infection Outcome: Progressing Goal: Diagnostic test results will improve Outcome: Progressing Goal: Respiratory complications will improve Outcome: Progressing Goal: Cardiovascular complication will be avoided Outcome: Progressing   Problem: Activity: Goal: Risk for activity intolerance will decrease Outcome: Progressing   Problem: Nutrition: Goal: Adequate nutrition will be maintained Outcome: Progressing   Problem: Coping: Goal: Level of anxiety will decrease Outcome: Progressing   Problem: Elimination: Goal: Will not experience complications related to bowel motility Outcome: Progressing Goal: Will not experience complications related to urinary retention Outcome: Progressing   Problem: Pain Managment: Goal: General experience of comfort will improve Outcome: Progressing   Problem: Safety: Goal: Ability to remain free from injury will improve Outcome: Progressing   Problem: Skin Integrity: Goal: Risk for impaired skin integrity will decrease Outcome: Progressing   Problem: Education: Goal: Knowledge of disease or condition will improve Outcome: Progressing Goal: Knowledge of the prescribed therapeutic regimen will improve Outcome: Progressing   Problem: Fluid Volume: Goal: Peripheral tissue perfusion will improve Outcome: Progressing   Problem: Clinical Measurements: Goal: Complications related to disease process, condition or treatment will be avoided or minimized Outcome: Progressing

## 2023-04-23 NOTE — Plan of Care (Signed)
Problem: Education: Goal: Knowledge of disease or condition will improve Outcome: Progressing Goal: Knowledge of the prescribed therapeutic regimen will improve Outcome: Progressing Goal: Individualized Educational Video(s) Outcome: Progressing   Problem: Clinical Measurements: Goal: Complications related to the disease process, condition or treatment will be avoided or minimized Outcome: Progressing   Problem: Education: Goal: Ability to describe self-care measures that may prevent or decrease complications (Diabetes Survival Skills Education) will improve Outcome: Progressing Goal: Individualized Educational Video(s) Outcome: Progressing   Problem: Coping: Goal: Ability to adjust to condition or change in health will improve Outcome: Progressing   Problem: Fluid Volume: Goal: Ability to maintain a balanced intake and output will improve Outcome: Progressing   Problem: Health Behavior/Discharge Planning: Goal: Ability to identify and utilize available resources and services will improve Outcome: Progressing Goal: Ability to manage health-related needs will improve Outcome: Progressing   Problem: Metabolic: Goal: Ability to maintain appropriate glucose levels will improve Outcome: Progressing   Problem: Nutritional: Goal: Maintenance of adequate nutrition will improve Outcome: Progressing Goal: Progress toward achieving an optimal weight will improve Outcome: Progressing   Problem: Skin Integrity: Goal: Risk for impaired skin integrity will decrease Outcome: Progressing   Problem: Tissue Perfusion: Goal: Adequacy of tissue perfusion will improve Outcome: Progressing   Problem: Education: Goal: Knowledge of General Education information will improve Description: Including pain rating scale, medication(s)/side effects and non-pharmacologic comfort measures Outcome: Progressing   Problem: Health Behavior/Discharge Planning: Goal: Ability to manage health-related  needs will improve Outcome: Progressing   Problem: Clinical Measurements: Goal: Ability to maintain clinical measurements within normal limits will improve Outcome: Progressing Goal: Will remain free from infection Outcome: Progressing Goal: Diagnostic test results will improve Outcome: Progressing Goal: Respiratory complications will improve Outcome: Progressing Goal: Cardiovascular complication will be avoided Outcome: Progressing   Problem: Activity: Goal: Risk for activity intolerance will decrease Outcome: Progressing   Problem: Nutrition: Goal: Adequate nutrition will be maintained Outcome: Progressing   Problem: Coping: Goal: Level of anxiety will decrease Outcome: Progressing   Problem: Elimination: Goal: Will not experience complications related to bowel motility Outcome: Progressing Goal: Will not experience complications related to urinary retention Outcome: Progressing   Problem: Pain Managment: Goal: General experience of comfort will improve Outcome: Progressing   Problem: Safety: Goal: Ability to remain free from injury will improve Outcome: Progressing   Problem: Skin Integrity: Goal: Risk for impaired skin integrity will decrease Outcome: Progressing   Problem: Education: Goal: Knowledge of disease or condition will improve Outcome: Progressing Goal: Knowledge of the prescribed therapeutic regimen will improve Outcome: Progressing   Problem: Fluid Volume: Goal: Peripheral tissue perfusion will improve Outcome: Progressing   Problem: Clinical Measurements: Goal: Complications related to disease process, condition or treatment will be avoided or minimized Outcome: Progressing   Problem: Education: Goal: Knowledge of Childbirth will improve Outcome: Progressing Goal: Ability to make informed decisions regarding treatment and plan of care will improve Outcome: Progressing Goal: Ability to state and carry out methods to decrease the pain  will improve Outcome: Progressing Goal: Individualized Educational Video(s) Outcome: Progressing   Problem: Coping: Goal: Ability to verbalize concerns and feelings about labor and delivery will improve Outcome: Progressing   Problem: Life Cycle: Goal: Ability to make normal progression through stages of labor will improve Outcome: Progressing Goal: Ability to effectively push during vaginal delivery will improve Outcome: Progressing   Problem: Role Relationship: Goal: Will demonstrate positive interactions with the child Outcome: Progressing   Problem: Education: Goal: Knowledge of disease or condition will improve  Outcome: Progressing Goal: Knowledge of the prescribed therapeutic regimen will improve Outcome: Progressing   Problem: Fluid Volume: Goal: Peripheral tissue perfusion will improve Outcome: Progressing   Problem: Clinical Measurements: Goal: Complications related to disease process, condition or treatment will be avoided or minimized Outcome: Progressing

## 2023-04-23 NOTE — Progress Notes (Addendum)
Labor Progress Note Madison Cook is a 27 y.o. [phone] at [redacted]w[redacted]d presented for IOL TOLAC due to pre-eclampsia with SF S: Pt still reports a headache that has not improved with fiorcet, excedrin, or flexeril  O:  BP 125/77   Pulse (!) 104   Temp 98 F (36.7 C) (Oral)   Resp 17   Ht 5\' 3"  (1.6 m)   Wt 123.4 kg   LMP 08/22/2022   SpO2 99%   BMI 48.18 kg/m  EFM: 120/<5 variability/10x10 accel/no decels  CVE: Dilation: Closed Effacement (%): Thick Station: -3 Presentation: Vertex Exam by:: Ndulue, MD   A&P: 27 y.o. [id number] [redacted]w[redacted]d IOL pre-E with SF #Labor: Progressing. Continue pitocin. Will assess for foley bulb placement at next check #Pain: per pt request #FWB: cat II minimal variability, had some accels while in the room, pt on IV mag #GBS not done - PCN ppx #Headache: will try oxycodone 10mg  and reglan 10mg   #cHTN with SI pre-E with SF: continue to watch BP, continue IV mag/PO nifedipine 90mg  XL daily, labs q12h #A1GDM: q4h glucose checks, continue sliding scale insulin   Kirstie Everhart, DO 9:15 PM  ___ GME ATTESTATION:  Evaluation and management procedures were performed by the [facility] Medicine Resident under my supervision. I was immediately available for direct supervision, assistance and direction throughout this encounter.  I also confirm that I have verified the information documented in the resident's note, and that I have also personally reperformed the pertinent components of the physical exam and all of the medical decision making activities.  I have also made any necessary editorial changes.  Myrtie Hawk, DO OB Fellow, Faculty [facility], Center for [facility] 04/23/2023 9:31 PM

## 2023-04-23 NOTE — Progress Notes (Signed)
Labor Progress Note Madison Cook is a 27 y.o. [phone] at [redacted]w[redacted]d presented for IOL TOLAC due pre-eclampsia with SF S: doing well. Had a good nap and feels rested. Feeling mild cramping from her contractions.  O:  BP 137/84   Pulse (!) 109   Temp 97.7 F (36.5 C) (Oral)   Resp 20   Ht 5\' 3"  (1.6 m)   Wt 123.4 kg   LMP 08/22/2022   SpO2 98%   BMI 48.18 kg/m  EFM: 120 bpm, moderate variability, + accelerations, no decelerations Contractions every 3- , mild, per toco  CVE: Dilation: Closed Effacement (%): Thick Station: -3 Presentation: Vertex Exam by:: Shamera Yarberry, MD   A&P: 27 y.o. [id number] [redacted]w[redacted]d IOL TOLAC for pre-eclampsia with SF, due to severe range blood pressures and increased swelling. #Labor: IOL continues. Cervix still closed. Continue pitocin. Now at 6 units which is the cap for now. Encouraged ambulation with a support person, to allow for some help with gravity. Discussed plan for a FB and pitocin with patient. Plan to recheck cervix In 4 hrs and attempt a FB if cervix is amenale #Pain: family support, IV fentanyl, epidural when ready  #FWB: Cat 1 #GBS  unknown. Will collect swab; on PCN for ppx  cHTN with SI Pre-eclampsia with SF: blood pressures well controlled. Has a h/ache, possibly caffeine withdrawal related. - Fioricet for h/ache, if no improvement can try flexeril. Patient declines tylenol. States it doesn't usually benefit her Continue IV mag sulf. Continue PO nifedipine 90mg  XL daily Watch blood pressures closely Labs q12hrs.   A1GDM:  Q4hr glucose checks - stable. Continue sliding scale insulin  Rh neg: rh work up postpartum  Sheppard Evens MD MPH OB Fellow, Faculty Practice [facility], Center for [employer] 04/23/2023

## 2023-04-23 NOTE — Progress Notes (Addendum)
Labor Progress Note Madison Cook is a 27 y.o. [phone] at [redacted]w[redacted]d presented for IOL TOLAC due to pre-eclampsia with SF S: Pt sitting comfortably, states headache is much better after oxycodone and reglan.   O:  BP (!) 139/92   Pulse (!) 110   Temp 98 F (36.7 C) (Oral)   Resp 16   Ht 5\' 3"  (1.6 m)   Wt 123.4 kg   LMP 08/22/2022   SpO2 100%   BMI 48.18 kg/m  EFM: 130/<5 variability/10x10 accels/no decels  CVE: Dilation: 1 Effacement (%): Thick Cervical Position: Posterior Station: -3 Presentation: Vertex Exam by:: Dr Camelia Phenes   A&P: 27 y.o. [id number] [redacted]w[redacted]d IOL TOLAC d/t pre-eclampsia with SF #Labor: Progressing well. Continue pitocin. Foley bulb placed.  #FWB: cat II minimal variability, pt on IV mag #GBS not done - PCN ppx #cHTN with SI pre-E with SF: continue to watch BP, continue IV mag/PO nifedipine 90mg  XL daily, labs q12h #A1GDM: q4h glucose checks, continue sliding scale insulin  Kirstie Everhart, DO 11:01 PM   ___ GME ATTESTATION:  Evaluation and management procedures were performed by the [facility] Medicine Resident under my supervision. I was immediately available for direct supervision, assistance and direction throughout this encounter.  I also confirm that I have verified the information documented in the resident's note, and that I have also personally reperformed the pertinent components of the physical exam and all of the medical decision making activities.  I have also made any necessary editorial changes.  Myrtie Hawk, DO OB Fellow, Faculty [facility], Center for [facility] 04/23/2023 11:11 PM

## 2023-04-23 NOTE — Progress Notes (Signed)
Patient ID: Madison Cook, female   DOB: [birth year], 27 y.o.   MRN: [id number]  Madison Cook MRN: [id number]  Subjective: -Care assumed of 27 y.o. [id number] at [redacted]w[redacted]d who presents for IOL after transfer from [facility].  Patient with severe PreE and GDM.  In room to meet acquaintance of patient and family (Sister and Mother).  Patient denies HA, visual disturbances, or RUQ pain.  Patient endorses fetal movement and denies vaginal bleeding or leaking.   Objective: BP (!) 147/88   Pulse 99   Temp 98 F (36.7 C) (Oral)   Resp 17   Ht 5\' 3"  (1.6 m)   Wt 123.4 kg   LMP 08/22/2022   SpO2 99%   BMI 48.18 kg/m  No intake/output data recorded. Total I/O In: -  Out: 200 [Urine:200]  Fetal Monitoring: FHT: 125 bpm, Mod Var, -Decels, +15x15 Accels UC: No Ctx graphed    Physical Exam: General appearance: alert, well appearing, and in no distress. Chest: Normal Rate.  No apparent respiratory distress. Abdominal exam: Gravid, Soft, NT . Extremities: Mild Edema Skin exam: Warm Dry  Vaginal Exam: SVE:    Deferred Currently Nurse reports BSUS performed and confirms vertex presentation. Membranes:Intact Internal Monitors: None  Augmentation/Induction: Pitocin:Initiate at 80mUn/min Cytotec: Not Candidate   Assessment:  IUP at 34 weeks Cat I FT  TOLAC IOL d/t Severe PreE GDM GBS Unknown Rh Negative H/O Hip Fracture  Plan: -Dr. Mitzi Hansen contacted and orders for magnesium sulfate placed. Patient also to continue sliding scale insulin.   -Provider to bedside to discuss POC. -Discussed r/b of induction including fetal distress, serial induction, pain, and increased risk of c/s delivery. -Discussed induction methods including foley bulb and pitocin. -Patient verbalizes understanding and wishes to proceed with induction process -Addressed questions regarding desires for epidural.  Informed that it would be at patient and provider discretion, but will take into acct h/o hip  fracture and associated pain.  Cautioned that epidural may not relieve hip pain that may be agitated by labor process. Patient verbalizes understanding. -Discussed placement of foley bulb with speculum and patient agreeable. -Plan to start PCN for GBS prophylaxis considering preterm status and h/o positive culture.  -IV infiltrated, so provider to return once IV access has been obtained.    Joyce Copa Guy Seese,MSN, CNM 04/23/2023, 6:27 AM

## 2023-04-23 NOTE — Progress Notes (Signed)
Labor Progress Note Madison Cook is a 27 y.o. [phone] at [redacted]w[redacted]d presented for IOL TOLAC due pre-eclampsia with SF S: still has a h/ache. Not much improvement with fioricet  O:  BP (!) 146/87   Pulse (!) 115   Temp 97.9 F (36.6 C) (Oral)   Resp 20   Ht 5\' 3"  (1.6 m)   Wt 123.4 kg   LMP 08/22/2022   SpO2 99%   BMI 48.18 kg/m   EFM: 125 bpm, moderate variability, + accelerations, no decelerations Contractions every 2- 4 mins, mild, per toco  CVE: Dilation: Closed Effacement (%): Thick Station: -3 Presentation: Vertex Exam by:: Jheri Mitter, MD   A&P: 27 y.o. [id number] [redacted]w[redacted]d IOL TOLAC for pre-eclampsia with SF, due to severe range blood pressures and increased swelling. #Labor: IOL continues. Cervix still closed, seems softer with more of a dimple now. Continue pitocin @ 6 units. Plan to recheck cervix In 4 hrs and attempt a FB if cervix is amenale #Pain: family support, IV fentanyl, epidural when ready  #FWB: Cat 1 #GBS  unknown. Will collect swab; on PCN for ppx  cHTN with SI Pre-eclampsia with SF: blood pressures well controlled. Has a h/ache, possibly caffeine withdrawal related. - will try excedrin + flexeril for h/ache. CBC, CMP stable. Mag level ordered - Continue IV mag sulf. Continue PO nifedipine 90mg  XL daily Watch blood pressures closely Labs q12hrs.   A1GDM:  Q4hr glucose checks - stable. Continue sliding scale insulin  Rh neg: rh work up postpartum  Sheppard Evens MD MPH OB Fellow, Faculty Practice [facility], Center for [employer] 04/23/2023

## 2023-04-23 NOTE — Progress Notes (Signed)
Labor Progress Note Madison Cook is a 27 y.o. [phone] at [redacted]w[redacted]d presented for IOL TOLAC due pre-eclampsia with SF S: doing ok. On Mag sulf for seizure ppx. Blood pressures elevated, but not in severe range  O:  BP (!) 142/107   Pulse (!) 104   Temp 98 F (36.7 C) (Oral)   Resp 17   Ht 5\' 3"  (1.6 m)   Wt 123.4 kg   LMP 08/22/2022   SpO2 98%   BMI 48.18 kg/m  EFM: 125bpm /moderate /+accels, no decels  CVE: Dilation: Closed Station: -3 Presentation: Vertex Exam by:: Dilynn Munroe, MD   A&P: 27 y.o. [id number] [redacted]w[redacted]d IOL TOLAC for pre-eclampsia with SF, due to severe range blood pressures and increased swelling. #Labor: IOL starts. Cervix closed. Discussed plan for a FB and pitocin with patient. Will start with pitcon 1 X 1, with a cap of 6. Plan to recheck cervix In 4 hrs and attempt a FB if cervix is amenale #Pain: family support, IV fentanyl, epidural when ready  #FWB: Cat 1 #GBS  unknown. Will collect swab; on PCN for ppx  cHTN with SI Pre-eclampsia with SF:  Continue IV mag sulf. Continue PO nifedipine 90mg  XL daily Watch blood pressures closely Labs q12hrs.   A1GDM:  Q4hr glucose checks. Continue sliding scale insulin  Rh neg: rh work up postpartum  Sheppard Evens MD MPH OB Fellow, Faculty Practice [facility], Center for [employer] 04/23/2023

## 2023-04-23 NOTE — Progress Notes (Signed)
Vertex presentation confirmed with ultrasound by RN

## 2023-04-24 ENCOUNTER — Inpatient Hospital Stay (HOSPITAL_COMMUNITY): Payer: Medicaid Other | Admitting: Anesthesiology

## 2023-04-24 ENCOUNTER — Ambulatory Visit: Payer: Medicaid Other

## 2023-04-24 DIAGNOSIS — Z302 Encounter for sterilization: Secondary | ICD-10-CM

## 2023-04-24 DIAGNOSIS — Z3A34 34 weeks gestation of pregnancy: Secondary | ICD-10-CM

## 2023-04-24 LAB — COMPREHENSIVE METABOLIC PANEL
ALT: 12 U/L (ref 0–44)
ALT: 13 U/L (ref 0–44)
AST: 22 U/L (ref 15–41)
AST: 22 U/L (ref 15–41)
Albumin: 2 g/dL — ABNORMAL LOW (ref 3.5–5.0)
Albumin: 2.2 g/dL — ABNORMAL LOW (ref 3.5–5.0)
Alkaline Phosphatase: 81 U/L (ref 38–126)
Alkaline Phosphatase: 84 U/L (ref 38–126)
Anion gap: 10 (ref 5–15)
Anion gap: 10 (ref 5–15)
BUN: 9 mg/dL (ref 6–20)
BUN: 10 mg/dL (ref 6–20)
CO2: 19 mmol/L — ABNORMAL LOW (ref 22–32)
CO2: 19 mmol/L — ABNORMAL LOW (ref 22–32)
Calcium: 6.8 mg/dL — ABNORMAL LOW (ref 8.9–10.3)
Calcium: 6.5 mg/dL — ABNORMAL LOW (ref 8.9–10.3)
Chloride: 102 mmol/L (ref 98–111)
Chloride: 100 mmol/L (ref 98–111)
Creatinine, Ser: 0.84 mg/dL (ref 0.44–1.00)
Creatinine, Ser: 1.04 mg/dL — ABNORMAL HIGH (ref 0.44–1.00)
GFR, Estimated: >60 mL/min (ref >60)
GFR, Estimated: >60 mL/min (ref >60)
Glucose, Bld: 93 mg/dL (ref 70–99)
Glucose, Bld: 103 mg/dL — ABNORMAL HIGH (ref 70–99)
Potassium: 4.6 mmol/L (ref 3.5–5.1)
Potassium: 4.3 mmol/L (ref 3.5–5.1)
Sodium: 131 mmol/L — ABNORMAL LOW (ref 135–145)
Sodium: 129 mmol/L — ABNORMAL LOW (ref 135–145)
Total Bilirubin: 0.3 mg/dL (ref 0.3–1.2)
Total Bilirubin: 0.2 mg/dL — ABNORMAL LOW (ref 0.3–1.2)
Total Protein: 5.5 g/dL — ABNORMAL LOW (ref 6.5–8.1)
Total Protein: 5.9 g/dL — ABNORMAL LOW (ref 6.5–8.1)

## 2023-04-24 LAB — CBC
HCT: 32.2 % — ABNORMAL LOW (ref 36.0–46.0)
HCT: 33.1 % — ABNORMAL LOW (ref 36.0–46.0)
Hemoglobin: 10 g/dL — ABNORMAL LOW (ref 12.0–15.0)
Hemoglobin: 10.4 g/dL — ABNORMAL LOW (ref 12.0–15.0)
MCH: 28.6 pg (ref 26.0–34.0)
MCH: 28.8 pg (ref 26.0–34.0)
MCHC: 31.1 g/dL (ref 30.0–36.0)
MCHC: 31.4 g/dL (ref 30.0–36.0)
MCV: 92 fL (ref 80.0–100.0)
MCV: 91.7 fL (ref 80.0–100.0)
Platelets: 173 10*3/uL (ref 150–400)
Platelets: 202 10*3/uL (ref 150–400)
RBC: 3.5 MIL/uL — ABNORMAL LOW (ref 3.87–5.11)
RBC: 3.61 MIL/uL — ABNORMAL LOW (ref 3.87–5.11)
RDW: 14 % (ref 11.5–15.5)
RDW: 14 % (ref 11.5–15.5)
WBC: 18.4 10*3/uL — ABNORMAL HIGH (ref 4.0–10.5)
WBC: 19.2 10*3/uL — ABNORMAL HIGH (ref 4.0–10.5)
nRBC: 0.3 % — ABNORMAL HIGH (ref 0.0–0.2)
nRBC: 0.2 % (ref 0.0–0.2)

## 2023-04-24 LAB — MAGNESIUM
Magnesium: 6.6 mg/dL (ref 1.7–2.4)
Magnesium: 7.2 mg/dL (ref 1.7–2.4)

## 2023-04-24 LAB — GLUCOSE, CAPILLARY
Glucose-Capillary: 100 mg/dL — ABNORMAL HIGH (ref 70–99)
Glucose-Capillary: 101 mg/dL — ABNORMAL HIGH (ref 70–99)
Glucose-Capillary: 102 mg/dL — ABNORMAL HIGH (ref 70–99)
Glucose-Capillary: 107 mg/dL — ABNORMAL HIGH (ref 70–99)
Glucose-Capillary: 91 mg/dL (ref 70–99)
Glucose-Capillary: 94 mg/dL (ref 70–99)

## 2023-04-24 MED ORDER — LIDOCAINE HCL (PF) 1 % IJ SOLN
INTRAMUSCULAR | Status: DC | PRN
  Administered 2023-04-24: 8 mL via EPIDURAL

## 2023-04-24 MED ORDER — LACTATED RINGERS IV SOLN: 500.0000 mL | Freq: Once | INTRAVENOUS | Status: DC

## 2023-04-24 MED ORDER — DIPHENHYDRAMINE HCL 50 MG/ML IJ SOLN
12.5000 mg | INTRAMUSCULAR | Status: AC | PRN
  Administered 2023-04-24 (×3): 12.5 mg via INTRAVENOUS
  Filled 2023-04-24 (×3): qty 1

## 2023-04-24 MED ORDER — EPHEDRINE 5 MG/ML INJ: 10.0000 mg | INTRAVENOUS | Status: DC | PRN

## 2023-04-24 MED ORDER — PHENYLEPHRINE 80 MCG/ML (10ML) SYRINGE FOR IV PUSH (FOR BLOOD PRESSURE SUPPORT)
80.0000 ug | PREFILLED_SYRINGE | INTRAVENOUS | Status: DC | PRN
  Administered 2023-04-24: 80 ug via INTRAVENOUS
  Filled 2023-04-24: qty 10

## 2023-04-24 MED ORDER — HYDROXYZINE HCL 50 MG PO TABS: 25.0000 mg | ORAL_TABLET | Freq: Three times a day (TID) | ORAL | Status: DC | PRN

## 2023-04-24 MED ORDER — LIDOCAINE-EPINEPHRINE (PF) 2 %-1:200000 IJ SOLN
INTRAMUSCULAR | Status: DC | PRN
  Administered 2023-04-24: 8 mL via EPIDURAL
  Administered 2023-04-25: 10 mL via EPIDURAL

## 2023-04-24 MED ORDER — OXYTOCIN-SODIUM CHLORIDE 30-0.9 UT/500ML-% IV SOLN
1.0000 m[IU]/min | INTRAVENOUS | Status: DC
  Administered 2023-04-24: 2 m[IU]/min via INTRAVENOUS

## 2023-04-24 MED ORDER — PHENYLEPHRINE 80 MCG/ML (10ML) SYRINGE FOR IV PUSH (FOR BLOOD PRESSURE SUPPORT): 80.0000 ug | PREFILLED_SYRINGE | INTRAVENOUS | Status: DC | PRN

## 2023-04-24 MED ORDER — PHENYLEPHRINE 80 MCG/ML (10ML) SYRINGE FOR IV PUSH (FOR BLOOD PRESSURE SUPPORT)
80.0000 ug | PREFILLED_SYRINGE | INTRAVENOUS | Status: DC | PRN
  Administered 2023-04-24 (×2): 80 ug via INTRAVENOUS

## 2023-04-24 MED ORDER — DIPHENHYDRAMINE HCL 50 MG/ML IJ SOLN: 12.5000 mg | INTRAMUSCULAR | Status: DC | PRN

## 2023-04-24 MED ORDER — FENTANYL-BUPIVACAINE-NACL 0.5-0.125-0.9 MG/250ML-% EP SOLN: 12.0000 mL/h | EPIDURAL | Status: DC | PRN

## 2023-04-24 MED ORDER — FENTANYL-BUPIVACAINE-NACL 0.5-0.125-0.9 MG/250ML-% EP SOLN
12.0000 mL/h | EPIDURAL | Status: DC | PRN
  Administered 2023-04-24 (×2): 12 mL/h via EPIDURAL
  Filled 2023-04-24 (×3): qty 250

## 2023-04-24 MED ORDER — NALBUPHINE HCL 10 MG/ML IJ SOLN
10.0000 mg | Freq: Once | INTRAMUSCULAR | Status: AC
  Administered 2023-04-24: 10 mg via INTRAVENOUS
  Filled 2023-04-24: qty 1

## 2023-04-24 MED ORDER — EPHEDRINE 5 MG/ML INJ
10.0000 mg | INTRAVENOUS | Status: DC | PRN
  Administered 2023-04-24: 10 mg via INTRAVENOUS
  Filled 2023-04-24: qty 5

## 2023-04-24 NOTE — Progress Notes (Signed)
Labor Progress Note  Madison Cook is a 27 y.o. [phone] at [redacted]w[redacted]d presented for IOL 2/2 Pre-E with SF, TOLAC.   S: doing well. Had a repeat FB that came out about 2hrs ago, while on a pitocin break.  O:  BP 122/69   Pulse 94   Temp 97.8 F (36.6 C) (Oral)   Resp 18   Ht 5\' 3"  (1.6 m)   Wt 123.4 kg   LMP 08/22/2022   SpO2 97%   BMI 48.18 kg/m   EFM: 115 bpm, moderate variability, + accelerations, no decelerations  CAT: 2 Toco: mild very intermittent contractions.  CVE: Dilation: 4.5 Effacement (%): 70 Cervical Position: Posterior Station: -3 Presentation: Vertex Exam by:: Dr. Ladon Applebaum  A&P: 27 y.o. [id number] [redacted]w[redacted]d  here for IOL 2/2 Pre-E with SF, TOLAC  #Labor: s/p FBX2; Pitocin break from 1300 - 1700 on 04/24/2023. AROM performed with clear fluid. IUPC placed. Restarted pitocin at 1700, @ 2X2. #Pain: Epidural #FWB: CAT 1 #GBS positive, PCN ppx  #cHTN with SI Pre-E with SF - BP has been stable, no elevated BP - Continue IV magnesium sulfate  - PO nifedipine 90mg  daily  - Labs q12 hours, next set due 1630-1700.   #A1GDM - q4hr glucose checks with sliding scale insulin.    Sheppard Evens MD MPH OB Fellow, Faculty Practice [facility], Center for [facility] 04/24/2023

## 2023-04-24 NOTE — Progress Notes (Signed)
Informed Dr Tacy Dura about patient BP.Epidural stopped for as ordered.

## 2023-04-24 NOTE — Anesthesia Procedure Notes (Signed)
Epidural Patient location during procedure: OB Start time: 04/24/2023 2:02 AM End time: 04/24/2023 2:10 AM  Staffing Anesthesiologist: Bethena Midget, MD Performed: other anesthesia staff   Preanesthetic Checklist Completed: patient identified, IV checked, site marked, risks and benefits discussed, surgical consent, monitors and equipment checked, pre-op evaluation and timeout performed  Epidural Patient position: sitting Prep: DuraPrep and site prepped and draped Patient monitoring: continuous pulse ox and blood pressure Approach: midline Location: L2-L3 Injection technique: LOR air  Needle:  Needle type: Tuohy  Needle gauge: 17 G Needle length: 9 cm and 9 Needle insertion depth: 8 cm Catheter type: closed end flexible Catheter size: 19 Gauge Catheter at skin depth: 14 cm Test dose: negative  Assessment Events: blood not aspirated, no cerebrospinal fluid, injection not painful, no injection resistance, no paresthesia and negative IV test

## 2023-04-24 NOTE — Progress Notes (Addendum)
Labor Progress Note  Madison Cook is a 27 y.o. [phone] at [redacted]w[redacted]d presented for IOL 2/2 Pre-E with SF, TOLAC.   S: Patient is doing well with her epidural. Patient is open to repeat cervical check and possible Foley balloon placement if there has not been significant cervical change.  O:  BP 108/71   Pulse (!) 102   Temp 97.8 F (36.6 C) (Oral)   Resp 18   Ht 5\' 3"  (1.6 m)   Wt 123.4 kg   LMP 08/22/2022   SpO2 97%   BMI 48.18 kg/m  EFM:115 bpm/Mild variability/ 10x10 accels/ None decels CAT: 2 Toco: contracting every 4 minutes    CVE: Dilation: 2 Effacement (%): 70 Cervical Position: Posterior Station: -3 Presentation: Vertex Exam by:: Dr. Ladon Applebaum   A&P: 27 y.o. [id number] [redacted]w[redacted]d  here for IOL 2/2 Pre-E with SF, TOLAC  #Labor: IOL continues with pitocin, patient made some cervical change from 1.5cm to 2cm. Patient was agreeable to replacing FB, so FB placed at 1203. Will re-evaluate when FB is expulsed to see if patient is good candidate for AROM.  #Pain: Epidural #FWB: CAT 2 #GBS positive, PCN ppx  #cHTN with SI Pre-E with SF - BP has been stable, no elevated BP - Continue IV magnesium sulfate  - PO nifedipine 90mg  daily  - Labs q12 hours, next set due 1630-1700.   #A1GDM - q4hr glucose checks with sliding scale insulin.     Glee Arvin, MD FM PGY-3 04/24/23  12:09 PM  Addendum at 1249:  Patient has been on pitocin since 0917 on 7/21. Will discontinue pitocin at this time with potential option to re-start pitocin in a few hours after FB is expulsed and hopeful AROM.

## 2023-04-24 NOTE — Progress Notes (Addendum)
Labor Progress Note Madison Cook is a 27 y.o. [phone] at [redacted]w[redacted]d presented for IOL TOLAC, pre-eclampsia with SF  S: Notified of fetal baseline change from 1 30-1 13 after epidural.  Currently receiving IV bolus.  O:  BP (!) 153/79 (BP Location: Right Arm)   Pulse (!) 103   Temp 97.8 F (36.6 C) (Oral)   Resp 17   Ht 5\' 3"  (1.6 m)   Wt 123.4 kg   LMP 08/22/2022   SpO2 99%   BMI 48.18 kg/m  EFM: 115 bpm/Mild variability/ 10x10 accels/ None decels CAT: 2 Toco: regular, every 3-4 minutes   CVE: Dilation: 1 Effacement (%): Thick Cervical Position: Posterior Station: -3 Presentation: Vertex (per [place] by Dr. Camelia Phenes) Exam by:: Dr Camelia Phenes   A&P: 27 y.o. [id number] [redacted]w[redacted]d IOL TOLAC d/t pre-eclampsia with SF  #Labor: Unable to reach cervix.  Vertex by ultrasound, fetal head more positioned towards maternal right. Foley bulb out.  Increase Pitocin 2 x 2 as tolerated by fetus.  Encourage maternal position changes.  Consider phenylephrine despite normal blood pressures to assist with perfusion to fetus.  #FWB: Category 2-likely secondary to magnesium and s/p epidural #GBS positive-PCN ppx #cHTN with SI pre-E with SF: continue to watch BP, continue IV mag/PO nifedipine 90mg  XL daily, labs q12h #A1GDM: q4h glucose checks, continue sliding scale insulin  [name], DO 3:03 AM

## 2023-04-24 NOTE — Progress Notes (Signed)
Labor Progress Note Madison Cook is a 27 y.o. [phone] at [redacted]w[redacted]d presented for IOL TOLAC due pre-eclampsia with SF S: doing well, no concerns this morning. Due for a cervical check. On pitocin at 8 units.  O:  BP (!) 115/53   Pulse (!) 107   Temp 97.8 F (36.6 C) (Oral)   Resp 18   Ht 5\' 3"  (1.6 m)   Wt 123.4 kg   LMP 08/22/2022   SpO2 94%   BMI 48.18 kg/m   EFM:  Contractions every 2- 4 mins, mild, per toco  CVE: Dilation: 1.5 Effacement (%): 70 Cervical Position: Posterior Station: -3 Presentation: Vertex Exam by:: Dr. Ladon Applebaum   A&P: 27 y.o. [id number] [redacted]w[redacted]d IOL TOLAC for pre-eclampsia with SF, due to severe range blood pressures and increased swelling. #Labor: IOL continues. S/p FB; cervix still very high  and ~ 1.5cm dilated. Continue pitocin and titrate 1 X 1 per protocol. Discussed option replacing the FB. Can explore at next check, if cervix still the same.  #Pain: Epidural in place #FWB: Cat 1 #GBS  unknown. PCN for ppx  cHTN with SI Pre-eclampsia with SF: blood pressures well controlled.  - slightly low Na, otherwise, labs stable. - Continue IV mag sulf. Continue PO nifedipine 90mg  XL daily Watch blood pressures closely Labs q12hrs.   A1GDM:  Q4hr glucose checks - stable. Continue sliding scale insulin  Rh neg: rh work up postpartum  Sheppard Evens MD MPH OB Fellow, Faculty Practice [facility], Center for [employer] 04/24/2023

## 2023-04-24 NOTE — Anesthesia Preprocedure Evaluation (Signed)
Anesthesia Evaluation  Patient identified by MRN, date of birth, ID band Patient awake    Reviewed: Allergy & Precautions, H&P , NPO status , Patient's Chart, lab work & pertinent test results, reviewed documented beta blocker date and time   Airway Mallampati: II  TM Distance: >3 FB Neck ROM: full    Dental no notable dental hx. (+) Teeth Intact, Dental Advisory Given   Pulmonary neg pulmonary ROS, asthma    Pulmonary exam normal breath sounds clear to auscultation       Cardiovascular hypertension, Pt. on medications and Pt. on home beta blockers negative cardio ROS Normal cardiovascular exam Rhythm:regular Rate:Normal     Neuro/Psych  PSYCHIATRIC DISORDERS Anxiety Depression    negative neurological ROS  negative psych ROS   GI/Hepatic negative GI ROS, Neg liver ROS,,,  Endo/Other  negative endocrine ROSdiabetes    Renal/GU negative Renal ROS  negative genitourinary   Musculoskeletal   Abdominal   Peds  Hematology negative hematology ROS (+)   Anesthesia Other Findings   Reproductive/Obstetrics (+) Pregnancy                              Anesthesia Physical Anesthesia Plan  ASA: 3  Anesthesia Plan: Epidural   Post-op Pain Management: Minimal or no pain anticipated   Induction: Intravenous  PONV Risk Score and Plan: 2 and Treatment may vary due to age or medical condition  Airway Management Planned: Natural Airway  Additional Equipment: None  Intra-op Plan:   Post-operative Plan:   Informed Consent: I have reviewed the patients History and Physical, chart, labs and discussed the procedure including the risks, benefits and alternatives for the proposed anesthesia with the patient or authorized representative who has indicated his/her understanding and acceptance.       Plan Discussed with: Anesthesiologist and CRNA  Anesthesia Plan Comments:          Anesthesia  Quick Evaluation

## 2023-04-25 ENCOUNTER — Other Ambulatory Visit: Payer: Self-pay

## 2023-04-25 ENCOUNTER — Encounter (HOSPITAL_COMMUNITY)
Admission: AD | Disposition: A | Discharge status: Home / Self Care | Payer: Self-pay | Source: Home / Self Care | Attending: Obstetrics and Gynecology

## 2023-04-25 ENCOUNTER — Encounter (HOSPITAL_COMMUNITY): Payer: Self-pay | Admitting: Family Medicine

## 2023-04-25 DIAGNOSIS — O4423 Partial placenta previa NOS or without hemorrhage, third trimester: Secondary | ICD-10-CM

## 2023-04-25 DIAGNOSIS — O24424 Gestational diabetes mellitus in childbirth, insulin controlled: Secondary | ICD-10-CM

## 2023-04-25 DIAGNOSIS — Z3A34 34 weeks gestation of pregnancy: Secondary | ICD-10-CM

## 2023-04-25 DIAGNOSIS — Z302 Encounter for sterilization: Secondary | ICD-10-CM

## 2023-04-25 DIAGNOSIS — O34211 Maternal care for low transverse scar from previous cesarean delivery: Secondary | ICD-10-CM

## 2023-04-25 DIAGNOSIS — O1414 Severe pre-eclampsia complicating childbirth: Secondary | ICD-10-CM

## 2023-04-25 HISTORY — PX: TUBAL LIGATION: SHX77

## 2023-04-25 LAB — CBC
HCT: 32.6 % — ABNORMAL LOW (ref 36.0–46.0)
HCT: 32.9 % — ABNORMAL LOW (ref 36.0–46.0)
Hemoglobin: 10.5 g/dL — ABNORMAL LOW (ref 12.0–15.0)
Hemoglobin: 10.6 g/dL — ABNORMAL LOW (ref 12.0–15.0)
MCH: 29.8 pg (ref 26.0–34.0)
MCH: 29 pg (ref 26.0–34.0)
MCHC: 32.2 g/dL (ref 30.0–36.0)
MCHC: 32.2 g/dL (ref 30.0–36.0)
MCV: 92.6 fL (ref 80.0–100.0)
MCV: 89.9 fL (ref 80.0–100.0)
Platelets: 178 10*3/uL (ref 150–400)
Platelets: 168 10*3/uL (ref 150–400)
RBC: 3.52 MIL/uL — ABNORMAL LOW (ref 3.87–5.11)
RBC: 3.66 MIL/uL — ABNORMAL LOW (ref 3.87–5.11)
RDW: 14.1 % (ref 11.5–15.5)
RDW: 14 % (ref 11.5–15.5)
WBC: 15.9 10*3/uL — ABNORMAL HIGH (ref 4.0–10.5)
WBC: 19.7 10*3/uL — ABNORMAL HIGH (ref 4.0–10.5)
nRBC: 0 % (ref 0.0–0.2)
nRBC: 0 % (ref 0.0–0.2)

## 2023-04-25 LAB — COMPREHENSIVE METABOLIC PANEL
ALT: 13 U/L (ref 0–44)
AST: 25 U/L (ref 15–41)
Albumin: 2.1 g/dL — ABNORMAL LOW (ref 3.5–5.0)
Alkaline Phosphatase: 88 U/L (ref 38–126)
Anion gap: 9 (ref 5–15)
BUN: 9 mg/dL (ref 6–20)
CO2: 23 mmol/L (ref 22–32)
Calcium: 6.1 mg/dL — CL (ref 8.9–10.3)
Chloride: 101 mmol/L (ref 98–111)
Creatinine, Ser: 0.92 mg/dL (ref 0.44–1.00)
GFR, Estimated: >60 mL/min (ref >60)
Glucose, Bld: 102 mg/dL — ABNORMAL HIGH (ref 70–99)
Potassium: 4.4 mmol/L (ref 3.5–5.1)
Sodium: 133 mmol/L — ABNORMAL LOW (ref 135–145)
Total Bilirubin: 0.2 mg/dL — ABNORMAL LOW (ref 0.3–1.2)
Total Protein: 5.8 g/dL — ABNORMAL LOW (ref 6.5–8.1)

## 2023-04-25 LAB — GLUCOSE, CAPILLARY
Glucose-Capillary: 85 mg/dL (ref 70–99)
Glucose-Capillary: 92 mg/dL (ref 70–99)
Glucose-Capillary: 89 mg/dL (ref 70–99)

## 2023-04-25 LAB — CULTURE, BETA STREP (GROUP B ONLY)

## 2023-04-25 LAB — MAGNESIUM: Magnesium: 6.9 mg/dL (ref 1.7–2.4)

## 2023-04-25 SURGERY — Surgical Case
Anesthesia: Epidural | Site: Abdomen

## 2023-04-25 MED ORDER — NALBUPHINE HCL 10 MG/ML IJ SOLN
10.0000 mg | Freq: Once | INTRAMUSCULAR | Status: AC
  Administered 2023-04-25: 10 mg via INTRAVENOUS
  Filled 2023-04-25: qty 1

## 2023-04-25 MED ORDER — METOCLOPRAMIDE HCL 5 MG/ML IJ SOLN
INTRAMUSCULAR | Status: AC
  Filled 2023-04-25: qty 2

## 2023-04-25 MED ORDER — HYDROMORPHONE HCL 1 MG/ML IJ SOLN
0.2500 mg | INTRAMUSCULAR | Status: DC | PRN
  Administered 2023-04-25 (×4): 0.25 mg via INTRAVENOUS

## 2023-04-25 MED ORDER — FENTANYL CITRATE (PF) 100 MCG/2ML IJ SOLN
INTRAMUSCULAR | Status: DC | PRN
  Administered 2023-04-25: 100 ug via EPIDURAL

## 2023-04-25 MED ORDER — MENTHOL 3 MG MT LOZG: 1.0000 | LOZENGE | OROMUCOSAL | Status: DC | PRN

## 2023-04-25 MED ORDER — LACTATED RINGERS IV SOLN: INTRAVENOUS | Status: DC | PRN

## 2023-04-25 MED ORDER — MORPHINE SULFATE (PF) 0.5 MG/ML IJ SOLN
INTRAMUSCULAR | Status: AC
  Filled 2023-04-25: qty 10

## 2023-04-25 MED ORDER — DIPHENHYDRAMINE HCL 25 MG PO CAPS: 25.0000 mg | ORAL_CAPSULE | Freq: Four times a day (QID) | ORAL | Status: DC | PRN

## 2023-04-25 MED ORDER — OXYCODONE HCL 5 MG/5ML PO SOLN: 5.0000 mg | Freq: Once | ORAL | Status: DC | PRN

## 2023-04-25 MED ORDER — SENNOSIDES-DOCUSATE SODIUM 8.6-50 MG PO TABS
2.0000 | ORAL_TABLET | Freq: Every day | ORAL | Status: DC
  Administered 2023-04-26 – 2023-04-28 (×3): 2 via ORAL
  Filled 2023-04-25 (×3): qty 2

## 2023-04-25 MED ORDER — HYDROXYZINE HCL 50 MG PO TABS
25.0000 mg | ORAL_TABLET | ORAL | Status: DC | PRN
  Administered 2023-04-25 – 2023-04-27 (×3): 25 mg via ORAL
  Filled 2023-04-25 (×3): qty 1

## 2023-04-25 MED ORDER — ACETAMINOPHEN 500 MG PO TABS
1000.0000 mg | ORAL_TABLET | Freq: Four times a day (QID) | ORAL | Status: AC
  Administered 2023-04-25 – 2023-04-26 (×3): 1000 mg via ORAL
  Filled 2023-04-25 (×3): qty 2

## 2023-04-25 MED ORDER — ACETAMINOPHEN 10 MG/ML IV SOLN
INTRAVENOUS | Status: AC
  Filled 2023-04-25: qty 400

## 2023-04-25 MED ORDER — OXYCODONE HCL 5 MG PO TABS
5.0000 mg | ORAL_TABLET | ORAL | Status: DC | PRN
  Administered 2023-04-26 (×2): 5 mg via ORAL
  Administered 2023-04-26 – 2023-04-28 (×7): 10 mg via ORAL
  Filled 2023-04-25 (×2): qty 2
  Filled 2023-04-25: qty 1
  Filled 2023-04-25 (×3): qty 2
  Filled 2023-04-25: qty 1
  Filled 2023-04-25 (×2): qty 2

## 2023-04-25 MED ORDER — LACTATED RINGERS IV SOLN: INTRAVENOUS | Status: DC

## 2023-04-25 MED ORDER — SCOPOLAMINE 1 MG/3DAYS TD PT72: 1.0000 | MEDICATED_PATCH | Freq: Once | TRANSDERMAL | Status: DC

## 2023-04-25 MED ORDER — MAGNESIUM SULFATE 40 GM/1000ML IV SOLN: 2.0000 g/h | INTRAVENOUS | Status: DC

## 2023-04-25 MED ORDER — NALOXONE HCL 4 MG/10ML IJ SOLN: 1.0000 ug/kg/h | INTRAVENOUS | Status: DC | PRN

## 2023-04-25 MED ORDER — ENOXAPARIN SODIUM 60 MG/0.6ML IJ SOSY
60.0000 mg | PREFILLED_SYRINGE | INTRAMUSCULAR | Status: DC
  Administered 2023-04-26 – 2023-04-28 (×3): 60 mg via SUBCUTANEOUS
  Filled 2023-04-25 (×3): qty 0.6

## 2023-04-25 MED ORDER — OXYTOCIN-SODIUM CHLORIDE 30-0.9 UT/500ML-% IV SOLN
INTRAVENOUS | Status: AC
  Filled 2023-04-25: qty 500

## 2023-04-25 MED ORDER — KETOROLAC TROMETHAMINE 30 MG/ML IJ SOLN: 30.0000 mg | Freq: Once | INTRAMUSCULAR | Status: DC | PRN

## 2023-04-25 MED ORDER — KETOROLAC TROMETHAMINE 30 MG/ML IJ SOLN
30.0000 mg | Freq: Four times a day (QID) | INTRAMUSCULAR | Status: AC
  Administered 2023-04-25 – 2023-04-26 (×4): 30 mg via INTRAVENOUS
  Filled 2023-04-25 (×3): qty 1

## 2023-04-25 MED ORDER — TRANEXAMIC ACID-NACL 1000-0.7 MG/100ML-% IV SOLN
INTRAVENOUS | Status: AC
  Filled 2023-04-25: qty 100

## 2023-04-25 MED ORDER — KETOROLAC TROMETHAMINE 30 MG/ML IJ SOLN
INTRAMUSCULAR | Status: AC
  Filled 2023-04-25: qty 1

## 2023-04-25 MED ORDER — LIDOCAINE-EPINEPHRINE (PF) 2 %-1:200000 IJ SOLN
INTRAMUSCULAR | Status: AC
  Filled 2023-04-25: qty 20

## 2023-04-25 MED ORDER — MAGNESIUM SULFATE 40 GM/1000ML IV SOLN
2.0000 g/h | INTRAVENOUS | Status: AC
  Administered 2023-04-25 – 2023-04-26 (×2): 2 g/h via INTRAVENOUS
  Filled 2023-04-25 (×2): qty 1000

## 2023-04-25 MED ORDER — KETOROLAC TROMETHAMINE 30 MG/ML IJ SOLN: 30.0000 mg | Freq: Four times a day (QID) | INTRAMUSCULAR | Status: DC | PRN

## 2023-04-25 MED ORDER — OXYTOCIN-SODIUM CHLORIDE 30-0.9 UT/500ML-% IV SOLN: 2.5000 [IU]/h | INTRAVENOUS | Status: AC

## 2023-04-25 MED ORDER — PHENYLEPHRINE HCL (PRESSORS) 10 MG/ML IV SOLN
INTRAVENOUS | Status: DC | PRN
  Administered 2023-04-25 (×3): 80 ug via INTRAVENOUS
  Administered 2023-04-25: 240 ug via INTRAVENOUS

## 2023-04-25 MED ORDER — DIPHENHYDRAMINE HCL 25 MG PO CAPS
25.0000 mg | ORAL_CAPSULE | ORAL | Status: DC | PRN
  Administered 2023-04-25 (×2): 25 mg via ORAL
  Filled 2023-04-25 (×2): qty 1

## 2023-04-25 MED ORDER — NIFEDIPINE ER OSMOTIC RELEASE 30 MG PO TB24
30.0000 mg | ORAL_TABLET | Freq: Every day | ORAL | Status: DC
  Administered 2023-04-26 – 2023-04-27 (×2): 30 mg via ORAL
  Filled 2023-04-25 (×2): qty 1

## 2023-04-25 MED ORDER — AMISULPRIDE (ANTIEMETIC) 5 MG/2ML IV SOLN: 10.0000 mg | Freq: Once | INTRAVENOUS | Status: DC | PRN

## 2023-04-25 MED ORDER — DEXAMETHASONE SODIUM PHOSPHATE 4 MG/ML IJ SOLN
INTRAMUSCULAR | Status: AC
  Filled 2023-04-25: qty 1

## 2023-04-25 MED ORDER — SODIUM CHLORIDE 0.9% FLUSH: 3.0000 mL | INTRAVENOUS | Status: DC | PRN

## 2023-04-25 MED ORDER — ACETAMINOPHEN 10 MG/ML IV SOLN
INTRAVENOUS | Status: DC | PRN
  Administered 2023-04-25: 1000 mg via INTRAVENOUS

## 2023-04-25 MED ORDER — CALCIUM CARBONATE ANTACID 500 MG PO CHEW
3.0000 | CHEWABLE_TABLET | Freq: Once | ORAL | Status: AC
  Administered 2023-04-25: 600 mg via ORAL
  Filled 2023-04-25: qty 3

## 2023-04-25 MED ORDER — ZOLPIDEM TARTRATE 5 MG PO TABS: 5.0000 mg | ORAL_TABLET | Freq: Every evening | ORAL | Status: DC | PRN

## 2023-04-25 MED ORDER — COCONUT OIL OIL
1.0000 | TOPICAL_OIL | Status: DC | PRN
  Administered 2023-04-25 – 2023-04-28 (×2): 1 via TOPICAL

## 2023-04-25 MED ORDER — MORPHINE SULFATE (PF) 0.5 MG/ML IJ SOLN
INTRAMUSCULAR | Status: DC | PRN
  Administered 2023-04-25: 3 mg via EPIDURAL

## 2023-04-25 MED ORDER — ONDANSETRON HCL 4 MG/2ML IJ SOLN
4.0000 mg | Freq: Three times a day (TID) | INTRAMUSCULAR | Status: DC | PRN
  Administered 2023-04-27: 4 mg via INTRAVENOUS
  Filled 2023-04-25: qty 2

## 2023-04-25 MED ORDER — DEXAMETHASONE SODIUM PHOSPHATE 4 MG/ML IJ SOLN
INTRAMUSCULAR | Status: DC | PRN
  Administered 2023-04-25: 10 mg via INTRAVENOUS

## 2023-04-25 MED ORDER — SODIUM CHLORIDE 0.9 % IV SOLN
500.0000 mg | Freq: Once | INTRAVENOUS | Status: AC
  Administered 2023-04-25: 500 mg via INTRAVENOUS

## 2023-04-25 MED ORDER — PRENATAL 28-0.8 MG PO TABS: 1.0000 | ORAL_TABLET | Freq: Every day | ORAL | Status: DC

## 2023-04-25 MED ORDER — ONDANSETRON HCL 4 MG/2ML IJ SOLN
INTRAMUSCULAR | Status: DC | PRN
  Administered 2023-04-25: 4 mg via INTRAVENOUS

## 2023-04-25 MED ORDER — IBUPROFEN 600 MG PO TABS
600.0000 mg | ORAL_TABLET | Freq: Four times a day (QID) | ORAL | Status: DC
  Administered 2023-04-26 – 2023-04-28 (×8): 600 mg via ORAL
  Filled 2023-04-25 (×8): qty 1

## 2023-04-25 MED ORDER — OXYCODONE HCL 5 MG PO TABS: 5.0000 mg | ORAL_TABLET | Freq: Once | ORAL | Status: DC | PRN

## 2023-04-25 MED ORDER — MIDAZOLAM HCL 2 MG/2ML IJ SOLN
INTRAMUSCULAR | Status: AC
  Filled 2023-04-25: qty 2

## 2023-04-25 MED ORDER — SIMETHICONE 80 MG PO CHEW
80.0000 mg | CHEWABLE_TABLET | ORAL | Status: DC | PRN
  Administered 2023-04-26: 80 mg via ORAL

## 2023-04-25 MED ORDER — CEFAZOLIN IN SODIUM CHLORIDE 3-0.9 GM/100ML-% IV SOLN
3.0000 g | Freq: Once | INTRAVENOUS | Status: AC
  Administered 2023-04-25: 3 g via INTRAVENOUS
  Filled 2023-04-25: qty 100

## 2023-04-25 MED ORDER — NALOXONE HCL 0.4 MG/ML IJ SOLN: 0.4000 mg | INTRAMUSCULAR | Status: DC | PRN

## 2023-04-25 MED ORDER — OXYTOCIN-SODIUM CHLORIDE 30-0.9 UT/500ML-% IV SOLN
INTRAVENOUS | Status: DC | PRN
  Administered 2023-04-25: 300 mL via INTRAVENOUS

## 2023-04-25 MED ORDER — MIDAZOLAM HCL 2 MG/2ML IJ SOLN
INTRAMUSCULAR | Status: DC | PRN
  Administered 2023-04-25: 2 mg via INTRAVENOUS

## 2023-04-25 MED ORDER — SIMETHICONE 80 MG PO CHEW
80.0000 mg | CHEWABLE_TABLET | Freq: Three times a day (TID) | ORAL | Status: DC
  Administered 2023-04-25 – 2023-04-28 (×7): 80 mg via ORAL
  Filled 2023-04-25 (×8): qty 1

## 2023-04-25 MED ORDER — DIBUCAINE (PERIANAL) 1 % EX OINT: 1.0000 | TOPICAL_OINTMENT | CUTANEOUS | Status: DC | PRN

## 2023-04-25 MED ORDER — WITCH HAZEL-GLYCERIN EX PADS: 1.0000 | MEDICATED_PAD | CUTANEOUS | Status: DC | PRN

## 2023-04-25 MED ORDER — MEPERIDINE HCL 25 MG/ML IJ SOLN: 6.2500 mg | INTRAMUSCULAR | Status: DC | PRN

## 2023-04-25 MED ORDER — PHENYLEPHRINE 80 MCG/ML (10ML) SYRINGE FOR IV PUSH (FOR BLOOD PRESSURE SUPPORT)
PREFILLED_SYRINGE | INTRAVENOUS | Status: AC
  Filled 2023-04-25: qty 10

## 2023-04-25 MED ORDER — TRANEXAMIC ACID-NACL 1000-0.7 MG/100ML-% IV SOLN
1000.0000 mg | INTRAVENOUS | Status: AC
  Administered 2023-04-25: 1000 mg via INTRAVENOUS

## 2023-04-25 MED ORDER — PRENATAL MULTIVITAMIN CH
1.0000 | ORAL_TABLET | Freq: Every day | ORAL | Status: DC
  Administered 2023-04-26 – 2023-04-28 (×3): 1 via ORAL
  Filled 2023-04-25 (×3): qty 1

## 2023-04-25 MED ORDER — HYDROMORPHONE HCL 1 MG/ML IJ SOLN
INTRAMUSCULAR | Status: AC
  Filled 2023-04-25: qty 1

## 2023-04-25 MED ORDER — ONDANSETRON HCL 4 MG/2ML IJ SOLN: 4.0000 mg | Freq: Once | INTRAMUSCULAR | Status: DC | PRN

## 2023-04-25 MED ORDER — PHENYLEPHRINE HCL-NACL 20-0.9 MG/250ML-% IV SOLN
INTRAVENOUS | Status: DC | PRN
  Administered 2023-04-25: 60 ug/min via INTRAVENOUS

## 2023-04-25 MED ORDER — FENTANYL CITRATE (PF) 100 MCG/2ML IJ SOLN
INTRAMUSCULAR | Status: AC
  Filled 2023-04-25: qty 2

## 2023-04-25 MED ORDER — ONDANSETRON HCL 4 MG/2ML IJ SOLN
INTRAMUSCULAR | Status: AC
  Filled 2023-04-25: qty 2

## 2023-04-25 MED ORDER — DIPHENHYDRAMINE HCL 50 MG/ML IJ SOLN: 12.5000 mg | INTRAMUSCULAR | Status: DC | PRN

## 2023-04-25 SURGICAL SUPPLY — 38 items
ADH SKN CLS APL DERMABOND .7 (GAUZE/BANDAGES/DRESSINGS) ×4
ADH SKN CLS LQ APL DERMABOND (GAUZE/BANDAGES/DRESSINGS) ×4
APL PRP STRL LF DISP 70% ISPRP (MISCELLANEOUS) ×4
CHLORAPREP W/TINT 26 (MISCELLANEOUS) ×4 IMPLANT
CLAMP UMBILICAL CORD (MISCELLANEOUS) ×2 IMPLANT
CLOTH BEACON ORANGE TIMEOUT ST (SAFETY) ×2 IMPLANT
DERMABOND ADVANCED .7 DNX12 (GAUZE/BANDAGES/DRESSINGS) ×4 IMPLANT
DERMABOND ADVANCED .7 DNX6 (GAUZE/BANDAGES/DRESSINGS) IMPLANT
DRSG OPSITE POSTOP 4X10 (GAUZE/BANDAGES/DRESSINGS) ×2 IMPLANT
ELECT REM PT RETURN 9FT ADLT (ELECTROSURGICAL) ×2
ELECTRODE REM PT RTRN 9FT ADLT (ELECTROSURGICAL) ×2 IMPLANT
EXTRACTOR VACUUM M CUP 4 TUBE (SUCTIONS) IMPLANT
GAUZE SPONGE 4X4 12PLY STRL LF (GAUZE/BANDAGES/DRESSINGS) IMPLANT
GLOVE BIOGEL PI IND STRL 7.0 (GLOVE) ×4 IMPLANT
GLOVE BIOGEL PI IND STRL 7.5 (GLOVE) ×4 IMPLANT
GLOVE ECLIPSE 7.5 STRL STRAW (GLOVE) ×2 IMPLANT
GOWN STRL REUS W/TWL LRG LVL3 (GOWN DISPOSABLE) ×6 IMPLANT
KIT ABG SYR 3ML LUER SLIP (SYRINGE) IMPLANT
LIGASURE IMPACT 36 18CM CVD LR (INSTRUMENTS) IMPLANT
MAT PREVALON FULL STRYKER (MISCELLANEOUS) IMPLANT
NDL HYPO 25X5/8 SAFETYGLIDE (NEEDLE) IMPLANT
NEEDLE HYPO 25X5/8 SAFETYGLIDE (NEEDLE) IMPLANT
NS IRRIG 1000ML POUR BTL (IV SOLUTION) ×2 IMPLANT
PACK C SECTION WH (CUSTOM PROCEDURE TRAY) ×2 IMPLANT
PAD OB MATERNITY 4.3X12.25 (PERSONAL CARE ITEMS) ×2 IMPLANT
RETRACTOR TRAXI PANNICULUS (MISCELLANEOUS) IMPLANT
RTRCTR C-SECT PINK 25CM LRG (MISCELLANEOUS) ×2 IMPLANT
SUT MNCRL 0 VIOLET CTX 36 (SUTURE) ×4 IMPLANT
SUT PLAIN 2 0 (SUTURE) ×2
SUT PLAIN ABS 2-0 CT1 27XMFL (SUTURE) IMPLANT
SUT VIC AB 0 CTX 36 (SUTURE) ×2
SUT VIC AB 0 CTX36XBRD ANBCTRL (SUTURE) ×2 IMPLANT
SUT VIC AB 2-0 CT1 27 (SUTURE) ×2
SUT VIC AB 2-0 CT1 TAPERPNT 27 (SUTURE) ×2 IMPLANT
SUT VIC AB 4-0 KS 27 (SUTURE) ×2 IMPLANT
TOWEL OR 17X24 6PK STRL BLUE (TOWEL DISPOSABLE) ×2 IMPLANT
TRAY FOLEY W/BAG SLVR 14FR LF (SET/KITS/TRAYS/PACK) ×2 IMPLANT
WATER STERILE IRR 1000ML POUR (IV SOLUTION) ×2 IMPLANT

## 2023-04-25 NOTE — Op Note (Signed)
[name] PROCEDURE DATE: 04/20/2023 - 04/25/2023  PREOPERATIVE DIAGNOSIS: Intrauterine pregnancy at  [redacted]w[redacted]d weeks gestation; failed induction  POSTOPERATIVE DIAGNOSIS: The same  PROCEDURE: Repeat Low Transverse Cesarean Section  SURGEON:  Dr. Candelaria Celeste  ASSISTANT: Dr Carley Hammed    INDICATIONS: Madison Cook is a 27 y.o. [id number] at [redacted]w[redacted]d scheduled for cesarean section secondary to failed induction.  The risks of cesarean section discussed with the patient included but were not limited to: bleeding which may require transfusion or reoperation; infection which may require antibiotics; injury to bowel, bladder, ureters or other surrounding organs; injury to the fetus; need for additional procedures including hysterectomy in the event of a life-threatening hemorrhage; placental abnormalities wth subsequent pregnancies, incisional problems, thromboembolic phenomenon and other postoperative/anesthesia complications. The patient concurred with the proposed plan, giving informed written consent for the procedure.    FINDINGS:  Viable female infant in vertex, OP presentation.  Apgars 9 and 10, weight, 6 pounds and 8.4 ounces.  Clear amniotic fluid.  Intact placenta, three vessel cord.  Normal uterus, fallopian tubes and ovaries bilaterally.  ANESTHESIA:    Spinal INTRAVENOUS FLUIDS:1300 ml ESTIMATED BLOOD LOSS: 340 ml URINE OUTPUT:  75 ml SPECIMENS: Placenta sent to L&D COMPLICATIONS: None immediate  PROCEDURE IN DETAIL:  The patient received intravenous antibiotics and had sequential compression devices applied to her lower extremities while in the preoperative area.  She was then taken to the operating room where spinal anesthesia was administered (epidural anesthesia was dosed up to surgical level) and was found to be adequate. She was then placed in a dorsal supine position with a leftward tilt, and prepped and draped in a sterile manner.  A foley catheter was placed into her  bladder and attached to constant gravity, which drained clear fluid throughout.  After an adequate timeout was performed, a Pfannenstiel skin incision was made with scalpel and carried through to the underlying layer of fascia. The fascia was incised in the midline and this incision was extended bilaterally using the Mayo scissors. Kocher clamps were applied to the superior aspect of the fascial incision and the underlying rectus muscles were dissected off bluntly. A similar process was carried out on the inferior aspect of the facial incision. The rectus muscles were separated in the midline bluntly and the peritoneum was entered bluntly. An Alexis retractor was placed to aid in visualization of the uterus.    Attention was turned to the lower uterine segment where a transverse hysterotomy was made with a scalpel and extended bilaterally bluntly. The infant was successfully delivered, and cord was clamped and cut and infant was handed over to awaiting neonatology team. Uterine massage was then administered and the placenta delivered intact with three-vessel cord. The uterus was then cleared of clot and debris.  The hysterotomy was closed with 0 Vicryl in a running locked fashion. Overall, excellent hemostasis was noted.   Attention was then turned to the patient's uterus, and right fallopian tube was identified and followed out to the fimbriated end.  Ligasure device was used to cauterize and cut the mesosalpinx to proximal end of the fallopian tube. A similar process was carried out on the left side allowing for bilateral tubal sterilization.  Hemostasis was confirmed.  The abdomen and the pelvis were cleared of all clot and debris and the Jon Gills was removed. Hemostasis was confirmed on all surfaces.  The peritoneum was reapproximated using 2-0 vicryl running stitches. The fascia was then closed using 0 Vicryl in a running  fashion. The subcutaneous layer was reapproximated with plain gut and the skin was  closed with 4-0 vicryl. The patient tolerated the procedure well. Sponge, lap, instrument and needle counts were correct x 2. She was taken to the recovery room in stable condition.    Levie Heritage, DO 04/25/2023 12:10 PM

## 2023-04-25 NOTE — Progress Notes (Signed)
Labor Progress Note Madison Cook is a 27 y.o. [phone] at [redacted]w[redacted]d presented for IOL 2/2 pre-e with SF, TOLAC S: Comfortable with epidural  O:  BP 139/89   Pulse (!) 110   Temp 98 F (36.7 C) (Oral)   Resp 16   Ht 5\' 3"  (1.6 m)   Wt 123.4 kg   LMP 08/22/2022   SpO2 97%   BMI 48.18 kg/m  EFM: 115/minimal variability/10x10 accels/no decels  CVE: Dilation: 4.5 Effacement (%): 70 Cervical Position: Posterior Station: -3 Presentation: Vertex Exam by:: Dr. Ladon Applebaum   A&P: 27 y.o. [id number] [redacted]w[redacted]d IOL 2/2 pre-e with SF, TOLAC #Labor: Extremely slow progression.  Just increased to 26 of Pitocin.  Contractions are every 3 to 6 minutes.  Will continue to titrate Pitocin until adequate contractions if able. #Pain: epidural #FWB: cat 1 #GBS positive PCN pprx #cHTN with SI pre-e with SF: stable Bps, continue IV mag, PO nifedipine 90mg  daily, labs q12 hours #A1GDM: CBGs q4h  Celedonio Savage, MD 5:51 AM

## 2023-04-25 NOTE — Transfer of Care (Signed)
Immediate Anesthesia Transfer of Care Note  Patient: Madison Cook  Procedure(s) Performed: CESAREAN SECTION BILATERAL TUBAL LIGATION (Bilateral: Abdomen)  Patient Location: PACU  Anesthesia Type:General  Level of Consciousness: awake  Airway & Oxygen Therapy: Patient Spontanous Breathing  Post-op Assessment: Report given to RN and Post -op Vital signs reviewed and stable  Post vital signs: Reviewed and stable  Last Vitals:  Vitals Value Taken Time  BP 126/89 04/25/23 1218  Temp    Pulse 98 04/25/23 1224  Resp 23 04/25/23 1224  SpO2 93 % 04/25/23 1224  Vitals shown include unfiled device data.  Last Pain:  Vitals:   04/25/23 0651  TempSrc:   PainSc: Asleep      Patients Stated Pain Goal: 0 (04/23/23 0116)  Complications: No notable events documented.

## 2023-04-25 NOTE — Lactation Note (Signed)
This note was copied from a baby's chart.  NICU Lactation Consultation Note  Patient Name: Madison Cook [id number]'M Date: 04/25/2023 Age:26 hours  Reason for consult: Initial assessment; NICU baby; Late-preterm 34-36.6wks; Maternal endocrine disorder Type of Endocrine Disorder?: Diabetes (GDMA2 (insulin))  SUBJECTIVE Visited with family of 26 73/1 weeks old AGA NICU female; Ms. Feldmeier is a P3 and has some experience breastfeeding. Her plan is to primarily breastfeed along with pumping and bottle feeding. She's already pumping with the hand pump and getting enough EBM to feed baby "Maggie" in the NICU; she started pumping yesterday (prenatally). She's currently on Mag + and might need some re-education tomorrow; she was sleepy. Reviewed pumping schedule, lactogenesis II, feeding readiness and anticipatory guidelines.  OBJECTIVE Infant data: Mother's Current Feeding Choice: Breast Milk  Infant feeding assessment Scale for Readiness: 3   Maternal data: [id number] C-Section, Low Transverse Has patient been taught Hand Expression?: Yes Hand Expression Comments: colostrum noted Significant Breast History:: (+) breast change during the pregnancy Current breast feeding challenges:: NICU admission Previous breastfeeding challenges?: Other (Comment) (baby # 1 had tongue tie) Does the patient have breastfeeding experience prior to this delivery?: Yes How long did the patient breastfeed?: 1st one for 4.5 months and 2nd for 2.5 months Pumping frequency: she started pumping yesterday (prenatally) with the hand pump, will start the DEBP today Pumped volume: 15 mL Flange Size: 30 Risk factor for low milk supply:: prematurity, infant separation, A2GDM, Mag +  Pump: Advised to call insurance company (Will cancel DEBP from insurance company to get a Stork pump through [place])  ASSESSMENT Infant: Feeding Status: Scheduled 9-12-3-6  Maternal: Milk volume:  Normal  INTERVENTIONS/PLAN Interventions: Interventions: Breast feeding basics reviewed; Coconut oil; DEBP; Education; [employer] brochure Tools: Pump; Flanges; Coconut oil Pump Education: Setup, frequency, and cleaning; Milk Storage  Plan: Encouraged bilateral pumping every 3 hours, ideally 8 pumping sessions/24 hours Breast massage, hand expression and coconut oil were also encouraged prior pumping She'll call for assistance when baby is ready to go to breast  No other support person at this time. All questions and concerns answered, family to contact [facility] services PRN.  Consult Status: NICU follow-up NICU Follow-up type: New admission follow up   Genette Huertas Venetia Constable 04/25/2023, 5:58 PM

## 2023-04-25 NOTE — Anesthesia Postprocedure Evaluation (Signed)
Anesthesia Post Note  Patient: Madison Cook  Procedure(s) Performed: CESAREAN SECTION BILATERAL TUBAL LIGATION (Bilateral: Abdomen)     Patient location during evaluation: PACU Anesthesia Type: Epidural Level of consciousness: awake and alert and oriented Pain management: pain level controlled Vital Signs Assessment: post-procedure vital signs reviewed and stable Respiratory status: spontaneous breathing, nonlabored ventilation and respiratory function stable Cardiovascular status: blood pressure returned to baseline and stable Postop Assessment: no headache, no backache, epidural receding and no apparent nausea or vomiting Anesthetic complications: no   No notable events documented.  Last Vitals:  Vitals:   04/25/23 1245 04/25/23 1300  BP: 133/76 (!) 141/94  Pulse: 84 80  Resp: 20 (!) 21  Temp:    SpO2: 97% 91%    Last Pain:  Vitals:   04/25/23 1245  TempSrc:   PainSc: 5    Pain Goal: Patients Stated Pain Goal: 0 (04/23/23 0116)  LLE Motor Response: Purposeful movement (04/25/23 1300) LLE Sensation: Decreased (04/25/23 1300) RLE Motor Response: Purposeful movement (04/25/23 1300) RLE Sensation: Decreased (04/25/23 1300) L Sensory Level: T10-Umbilical region (04/25/23 1300) R Sensory Level: T10-Umbilical region (04/25/23 1300) Epidural/Spinal Function Cutaneous sensation: Tingles (04/25/23 1245), Patient able to flex knees: Yes (04/25/23 1245), Patient able to lift hips off bed: No (04/25/23 1245), Back pain beyond tenderness at insertion site: No (04/25/23 1245), Progressively worsening motor and/or sensory loss: No (04/25/23 1245), Bowel and/or bladder incontinence post epidural: No (04/25/23 1245)  [name]

## 2023-04-25 NOTE — Progress Notes (Signed)
Labor Progress Note Madison Cook is a 27 y.o. [phone] at 106w2d presented for IOL 2/2 pre-e with SF, TOLAC  S: Comfortable with epidural  O:  BP 128/72   Pulse (!) 101   Temp 98 F (36.7 C) (Oral)   Resp 15   Ht 5\' 3"  (1.6 m)   Wt 123.4 kg   LMP 08/22/2022   SpO2 100%   BMI 48.18 kg/m  EFM: 105bpm/min to mod variability/+ accels/no decels  CVE: Dilation: 5 Effacement (%): 80 Cervical Position: Posterior Station: -3 Presentation: Vertex Exam by:: Scherrie Merritts, MD  A&P: 27 y.o. [id number] [redacted]w[redacted]d IOL 2/2 pre-e with SF, TOLAC #Labor: On 30 of pitocin s/p pit break after >24 hours of pitocin. Her cervical exam has been 4.5-5 since 12PM 7/22. MVUs are 75. She is feeling more pelvic discomfort that she has not felt before. We will plan to recheck her cervix at 10am. If unchanged, I recommend proceeding with c-section for arrest of dilation. She desires tubal ligation; consent signed with Dr. Earlene Plater.   The risks of surgery were discussed with the patient including but were not limited to: bleeding which may require transfusion or reoperation; infection which may require antibiotics; injury to bowel, bladder, ureters or other surrounding organs; injury to the fetus; need for additional procedures including hysterectomy in the event of a life-threatening hemorrhage; formation of adhesions; placental abnormalities wth subsequent pregnancies; incisional problems; thromboembolic phenomenon and other postoperative/anesthesia complications.   #Pain: epidural #FWB: cat 1 #GBS positive PCN pprx #cHTN with SI pre-e with SF: stable Bps, continue IV mag, PO nifedipine 90mg  daily, labs q12 hours #A1GDM: CBGs q4h  [name], DO 9:03 AM

## 2023-04-25 NOTE — Discharge Summary (Signed)
Postpartum Discharge Summary  Date of Service updated: 04/28/23     Patient Name: Madison Cook DOB: May 01, 1996 MRN: [id number]  Date of admission: 04/20/2023 Delivery date:04/25/2023 Delivering provider: Levie Heritage Date of discharge: 04/28/2023  Admitting diagnosis: Preeclampsia, third trimester [O14.93] Intrauterine pregnancy: [redacted]w[redacted]d     Secondary diagnosis:  Principal Problem:   Severe pre-eclampsia Active Problems:   Rh negative state in antepartum period   Marginal insertion of umbilical cord affecting management of mother   Previous cesarean delivery affecting pregnancy, antepartum   Gestational diabetes   [redacted] weeks gestation of pregnancy  Additional problems: none    Discharge diagnosis: Preterm Pregnancy Delivered                                              Post partum procedures: NA Augmentation: AROM, Pitocin, and Cytotec Complications: None  Hospital course: Induction of Labor With Cesarean Section   27 y.o. yo [phone] at [redacted]w[redacted]d was admitted to the hospital 04/20/2023 for induction of labor. Patient had a labor course significant for no cervical dilation past 4-5cm despite pitocin. The patient went for cesarean section due to  failed induction . Delivery details are as follows: Membrane Rupture Time/Date: 4:56 PM,04/24/2023  Delivery Method:C-Section, Low Transverse Details of operation can be found in separate operative Note.  Patient had a uncomplicated postpartum course. She is ambulating, tolerating a regular diet, passing flatus, and urinating well.  Patient is discharged home in stable condition on 04/28/23.      Newborn Data: Birth date:04/25/2023 Birth time:11:25 AM Gender:Female Living status:Living Apgars:9 ,10  Weight:2960 g                               Magnesium Sulfate received: Yes: Seizure prophylaxis BMZ received: No Rhophylac:No MMR:No T-DaP: NA Flu: N/A Transfusion:No  Physical exam  Vitals:   04/27/23 1530 04/28/23 0047  04/28/23 0400 04/28/23 0832  BP: (!) 155/90 (!) 153/90 (!) 150/87 (!) 143/92  Pulse: 62 64 61 (!) 59  Resp: 19 18 18 19   Temp: 98 F (36.7 C) 98.3 F (36.8 C) 98.3 F (36.8 C) 99.2 F (37.3 C)  TempSrc: Oral Oral Oral Oral  SpO2: 98%  100% 97%  Weight:      Height:       General: alert Lochia: appropriate Uterine Fundus: firm Incision: Healing well with no significant drainage DVT Evaluation: No evidence of DVT seen on physical exam. Labs: Lab Results  Component Value Date   WBC 15.1 (H) 04/26/2023   HGB 9.9 (L) 04/26/2023   HCT 30.8 (L) 04/26/2023   MCV 92.5 04/26/2023   PLT 160 04/26/2023      Latest Ref Rng & Units 04/27/2023   11:25 AM  CMP  Glucose 70 - 99 mg/dL 82   BUN 6 - 20 mg/dL 16   Creatinine 2.84 - 1.00 mg/dL 1.32   Sodium 440 - 102 mmol/L 136   Potassium 3.5 - 5.1 mmol/L 4.1   Chloride 98 - 111 mmol/L 100   CO2 22 - 32 mmol/L 25   Calcium 8.9 - 10.3 mg/dL 8.2   Total Protein 6.5 - 8.1 g/dL 5.7   Total Bilirubin 0.3 - 1.2 mg/dL 0.3   Alkaline Phos 38 - 126 U/L 75   AST 15 - 41  U/L 25   ALT 0 - 44 U/L 16    Edinburgh Score:    04/27/2023   11:12 AM  Edinburgh Postnatal Depression Scale Screening Tool  I have been able to laugh and see the funny side of things. 1  I have looked forward with enjoyment to things. 0  I have blamed myself unnecessarily when things went wrong. 2  I have been anxious or worried for no good reason. 2  I have felt scared or panicky for no good reason. 0  Things have been getting on top of me. 1  I have been so unhappy that I have had difficulty sleeping. 0  I have felt sad or miserable. 0  I have been so unhappy that I have been crying. 0  The thought of harming myself has occurred to me. 0  Edinburgh Postnatal Depression Scale Total 6     After visit meds:  Allergies as of 04/28/2023       Reactions   Gabapentin Other (See Comments)        Medication List     STOP taking these medications    Accu-Chek  Guide test strip Generic drug: glucose blood   Accu-Chek Guide w/Device Kit   Accu-Chek Softclix Lancets lancets   aspirin EC 81 MG tablet   cetirizine 10 MG tablet Commonly known as: ZyrTEC Allergy   promethazine 25 MG suppository Commonly known as: PHENERGAN   promethazine 25 MG tablet Commonly known as: PHENERGAN   tamsulosin 0.4 MG Caps capsule Commonly known as: FLOMAX       TAKE these medications    furosemide 20 MG tablet Commonly known as: LASIX Take 1 tablet (20 mg total) by mouth daily. Start taking on: April 29, 2023   ibuprofen 600 MG tablet Commonly known as: ADVIL Take 1 tablet (600 mg total) by mouth every 6 (six) hours.   NIFEdipine 60 MG 24 hr tablet Commonly known as: PROCARDIA XL/NIFEDICAL XL Take 1 tablet (60 mg total) by mouth daily.   oxyCODONE 5 MG immediate release tablet Commonly known as: Oxy IR/ROXICODONE Take 1 tablet (5 mg total) by mouth every 6 (six) hours as needed for moderate pain.   Prenatal 28-0.8 MG Tabs Take 1 tablet by mouth daily.   sertraline 100 MG tablet Commonly known as: ZOLOFT Take 100 mg by mouth daily.   traZODone 50 MG tablet Commonly known as: DESYREL Take 50 mg by mouth at bedtime.         Discharge home in stable condition Infant Feeding: Breast Infant Disposition:NICU Discharge instruction: per After Visit Summary and Postpartum booklet. Activity: Advance as tolerated. Pelvic rest for 6 weeks.  Diet: carb modified diet Future Appointments: Future Appointments  Date Time Provider Department Center  05/02/2023  3:50 PM [facility] NURSE [facility]  05/23/2023  8:15 AM Rayville Bing, MD [facility]  05/23/2023  8:45 AM [facility] LAB [facility]   Follow up Visit:   Please schedule this patient for a In person postpartum visit in 1 week with the following provider: RN. Additional Postpartum F/U:Incision check 1 week and BP check 1 week  High risk pregnancy  complicated by: GDM and Preeclampsia with severe features Delivery mode:  C-Section, Low Transverse Anticipated Birth Control:  BTL done [facility]   04/28/2023 Hermina Staggers, MD

## 2023-04-25 NOTE — Progress Notes (Signed)
Labor Progress Note Madison Cook is a 27 y.o. [phone] at [redacted]w[redacted]d presented for IOL 2/2 pre-e with SF, TOLAC  S: No acute concerns.   O:  BP 106/74   Pulse 91   Temp 98 F (36.7 C) (Oral)   Resp 15   Ht 5\' 3"  (1.6 m)   Wt 123.4 kg   LMP 08/22/2022   SpO2 100%   BMI 48.18 kg/m  EFM: 120bpm /min to mod variability/+ accels/no decels  CVE: Dilation: 5.5 Effacement (%): 80 Cervical Position: Posterior Station: -3 Presentation: Vertex Exam by:: Salvadore Dom, MD  A&P: 27 y.o. [id number] [redacted]w[redacted]d IOL 2/2 pre-e with SF, TOLAC #Labor: Grossly unchanged and now with a plum sized clot, concern for placental abruption. Previously consented for c-section and BTL. The patient agrees with this plan. All questions answered.   The patient concurred with the proposed plan, giving informed written consent for the procedures.  Antibiotics ordered.  To OR when ready.    #Pain: epidural #FWB: cat 1 #GBS positive PCN pprx #cHTN with SI pre-e with SF: stable Bps, continue IV mag, PO nifedipine 90mg  daily, labs q12 hours #A1GDM: CBGs q4h  Javone Ybanez Autry-Lott, DO 10:21 AM

## 2023-04-25 NOTE — Progress Notes (Signed)
Labor Progress Note Madison Cook is a 27 y.o. [phone] at [redacted]w[redacted]d presented for IOL 2/2 pre-e with SF, TOLAC S: doing well  O:  BP 131/85   Pulse (!) 103   Temp 97.7 F (36.5 C) (Axillary)   Resp 18   Ht 5\' 3"  (1.6 m)   Wt 123.4 kg   LMP 08/22/2022   SpO2 92%   BMI 48.18 kg/m  EFM: 115/minimal variability/10x10 accels/no decels  CVE: Dilation: 4.5 Effacement (%): 70 Cervical Position: Posterior Station: -3 Presentation: Vertex Exam by:: Dr. Ladon Applebaum   A&P: 27 y.o. [id number] [redacted]w[redacted]d IOL 2/2 pre-e with SF, TOLAC #Labor: Progressing well. Continue pitocin #Pain: epidural #FWB: cat 1 #GBS positive PCN pprx #cHTN with SI pre-e with SF: stable Bps, continue IV mag, PO nifedipine 90mg  daily, labs q12 hours #A1GDM: CBGs q4h  Debroah Shuttleworth, DO 1:41 AM

## 2023-04-26 ENCOUNTER — Other Ambulatory Visit: Payer: Medicaid Other

## 2023-04-26 ENCOUNTER — Encounter: Payer: Medicaid Other | Admitting: Family Medicine

## 2023-04-26 LAB — CBC
HCT: 30.8 % — ABNORMAL LOW (ref 36.0–46.0)
Hemoglobin: 9.9 g/dL — ABNORMAL LOW (ref 12.0–15.0)
MCH: 29.7 pg (ref 26.0–34.0)
MCHC: 32.1 g/dL (ref 30.0–36.0)
MCV: 92.5 fL (ref 80.0–100.0)
Platelets: 160 10*3/uL (ref 150–400)
RBC: 3.33 MIL/uL — ABNORMAL LOW (ref 3.87–5.11)
RDW: 13.9 % (ref 11.5–15.5)
WBC: 15.1 10*3/uL — ABNORMAL HIGH (ref 4.0–10.5)
nRBC: 0 % (ref 0.0–0.2)

## 2023-04-26 LAB — COMPREHENSIVE METABOLIC PANEL
ALT: 10 U/L (ref 0–44)
AST: 29 U/L (ref 15–41)
Albumin: 1.8 g/dL — ABNORMAL LOW (ref 3.5–5.0)
Alkaline Phosphatase: 82 U/L (ref 38–126)
Anion gap: 9 (ref 5–15)
BUN: 11 mg/dL (ref 6–20)
CO2: 26 mmol/L (ref 22–32)
Calcium: 6.3 mg/dL — CL (ref 8.9–10.3)
Chloride: 99 mmol/L (ref 98–111)
Creatinine, Ser: 0.93 mg/dL (ref 0.44–1.00)
GFR, Estimated: >60 mL/min (ref >60)
Glucose, Bld: 95 mg/dL (ref 70–99)
Potassium: 4.5 mmol/L (ref 3.5–5.1)
Sodium: 134 mmol/L — ABNORMAL LOW (ref 135–145)
Total Bilirubin: 0.4 mg/dL (ref 0.3–1.2)
Total Protein: 5.2 g/dL — ABNORMAL LOW (ref 6.5–8.1)

## 2023-04-26 LAB — MAGNESIUM: Magnesium: 6.4 mg/dL (ref 1.7–2.4)

## 2023-04-26 LAB — CULTURE, BETA STREP (GROUP B ONLY)

## 2023-04-26 MED ORDER — CALCIUM GLUCONATE-NACL 1-0.675 GM/50ML-% IV SOLN: 1.0000 g | Freq: Once | INTRAVENOUS | Status: DC

## 2023-04-26 MED ORDER — ACETAMINOPHEN 325 MG PO TABS
650.0000 mg | ORAL_TABLET | Freq: Four times a day (QID) | ORAL | Status: DC | PRN
  Administered 2023-04-27: 650 mg via ORAL
  Filled 2023-04-26: qty 2

## 2023-04-26 MED ORDER — CALCIUM GLUCONATE-NACL 2-0.675 GM/100ML-% IV SOLN
2.0000 g | Freq: Once | INTRAVENOUS | Status: AC
  Administered 2023-04-26: 2000 mg via INTRAVENOUS
  Filled 2023-04-26: qty 100

## 2023-04-26 MED ORDER — FUROSEMIDE 20 MG PO TABS
20.0000 mg | ORAL_TABLET | Freq: Every day | ORAL | Status: DC
  Administered 2023-04-26 – 2023-04-28 (×3): 20 mg via ORAL
  Filled 2023-04-26 (×3): qty 1

## 2023-04-26 NOTE — Progress Notes (Signed)
POSTPARTUM PROGRESS NOTE  POD #1  Subjective:  Madison Cook is a 27 y.o. [phone] s/p repeat LTCS at [redacted]w[redacted]d.  She reports she doing well. No acute events overnight. She continue magnesium sulfate. She denies any problems with ambulating, voiding or po intake. Denies nausea or vomiting. She has not passed flatus. Pain is well controlled.  Lochia is appropriate.  Currently states she does not like the way  the magnesium sulfate makes her feel.  Objective: Blood pressure (!) 146/85, pulse 71, temperature 98 F (36.7 C), temperature source Oral, resp. rate 18, height 5\' 3"  (1.6 m), weight 123.4 kg, last menstrual period 08/22/2022, SpO2 98%, unknown if currently breastfeeding.  Physical Exam:  General: alert, cooperative and no distress Chest: no respiratory distress, clear to auscultation bilaterally. Heart:regular rate and rhythm Abdomen: soft, nontender, nondistended, positive bowel sounds Uterine Fundus: firm, appropriately tender DVT Evaluation: No calf swelling or tenderness, SCDs in place Extremities: trace edema Skin: warm, dry; incision clean/dry/intact w/ honeycomb dressing in place  Recent Labs    04/25/23 1433 04/26/23 0442  HGB 10.6* 9.9*  HCT 32.9* 30.8*    Assessment/Plan: Madison Cook is a 27 y.o. [id number] s/p repeat LTCS at [redacted]w[redacted]d for failed TOLAC.  POD#1 -  Contraception: BTL Feeding: breast Magnesium off at 1500 Urged increased ambulation. Hypocalcemia noted, will give IV calcium gluconate 2 gram Start lasix x 5 days   LOS: 6 days   Mariel Aloe, Md Faculty Attending, Center for [facility] 04/26/2023, 7:47 AM

## 2023-04-26 NOTE — Lactation Note (Signed)
This note was copied from a baby's chart.  NICU Lactation Consultation Note  Patient Name: Girl Jenicka Coxe [id number] Date: 04/26/2023 Age:26 hours  Reason for consult: Follow-up assessment; NICU baby; Late-preterm 34-36.6wks; Maternal endocrine disorder; Other (Comment) (GHTN on MgSO4) Type of Endocrine Disorder?: Diabetes (GDM on insulin)  SUBJECTIVE  LC in to visit with P3 Mom of baby "Maggie" delivered by C/Section at [redacted]w[redacted]d and is currently in the NICU on scheduled gavage feeds of donor breast milk+/expressed breast milk.  Mom remains on magnesium sulfate until 3pm this afternoon.    Reviewed pumping routine.  Noted a hand pump at bedside and pump parts by the sink.  Mom reports she "gets more" with the hand pump.  LC encouraged also double pumping to stimulate her milk producing hormones better than a single hand pump would.  Mom is trying, but is still groggy from her medication.  LC provided Mom with a washing and drying bin and labeled them.  LC washed all the pump parts educating Mom on the importance of this after pumping each time.  Mom has colostrum bottles and stickers to label each bottle.       OBJECTIVE Infant data: Mother's Current Feeding Choice: Breast Milk  Infant feeding assessment Scale for Readiness: 3   Maternal data: [id number] C-Section, Low Transverse Has patient been taught Hand Expression?: Yes Hand Expression Comments: colostrum noted Significant Breast History:: (+) breast change during the pregnancy Current breast feeding challenges:: NICU admission Previous breastfeeding challenges?: Other (Comment) (baby # 1 had tongue tie) Does the patient have breastfeeding experience prior to this delivery?: Yes How long did the patient breastfeed?: 1st one for 4.5 months and 2nd for 2.5 months Pumping frequency: Not consistent, will try to increase to every 3-4 hrs today Pumped volume: 15 mL Flange Size: 30 (Mom's preference.  LC suggested a 27 mm.) Risk  factor for low milk supply:: prematurity, infant separation, A2GDM, Mag +  Pump:  (Mom plans to call  [employer] to cancel her DEBP for LC to submit a STORK pump request)  ASSESSMENT Infant: Feeding Status: Scheduled 9-12-3-6  Maternal: Milk volume: Normal  INTERVENTIONS/PLAN Interventions: Interventions: Breast feeding basics reviewed; Coconut oil; DEBP; Education; [employer] brochure Tools: Pump; Flanges Pump Education: Setup, frequency, and cleaning; Milk Storage  Plan: written on dry erase board in Mom's room 1- STS with baby as much as possible (when able to) 2-Massage breasts and hand express often 3- Pump both breasts on initiation setting for 15 mins  Consult Status: NICU follow-up NICU Follow-up type: New admission follow up; Verify onset of copious milk; Verify absence of engorgement   [name] 04/26/2023, 9:22 AM

## 2023-04-27 LAB — COMPREHENSIVE METABOLIC PANEL
ALT: 16 U/L (ref 0–44)
AST: 25 U/L (ref 15–41)
Albumin: 1.9 g/dL — ABNORMAL LOW (ref 3.5–5.0)
Alkaline Phosphatase: 75 U/L (ref 38–126)
Anion gap: 11 (ref 5–15)
BUN: 16 mg/dL (ref 6–20)
CO2: 25 mmol/L (ref 22–32)
Calcium: 8.2 mg/dL — ABNORMAL LOW (ref 8.9–10.3)
Chloride: 100 mmol/L (ref 98–111)
Creatinine, Ser: 0.89 mg/dL (ref 0.44–1.00)
GFR, Estimated: >60 mL/min (ref >60)
Glucose, Bld: 82 mg/dL (ref 70–99)
Potassium: 4.1 mmol/L (ref 3.5–5.1)
Sodium: 136 mmol/L (ref 135–145)
Total Bilirubin: 0.3 mg/dL (ref 0.3–1.2)
Total Protein: 5.7 g/dL — ABNORMAL LOW (ref 6.5–8.1)

## 2023-04-27 LAB — SURGICAL PATHOLOGY

## 2023-04-27 MED ORDER — NIFEDIPINE ER OSMOTIC RELEASE 30 MG PO TB24
30.0000 mg | ORAL_TABLET | Freq: Once | ORAL | Status: AC
  Administered 2023-04-28: 30 mg via ORAL
  Filled 2023-04-27: qty 1

## 2023-04-27 MED ORDER — NIFEDIPINE ER OSMOTIC RELEASE 60 MG PO TB24
60.0000 mg | ORAL_TABLET | Freq: Every day | ORAL | Status: DC
  Administered 2023-04-28: 60 mg via ORAL
  Filled 2023-04-27: qty 1

## 2023-04-27 NOTE — Progress Notes (Signed)
Subjective: Postpartum Day 2: Cesarean Delivery with BTL with CHTN with SISPEC Patient reports doing well this morning. Denies HA or visual changes. Pain controlled. Tolerating diet. Ambulating, voiding, + BM    Objective: Vital signs in last 24 hours: Temp:  [97.8 F (36.6 C)-98.4 F (36.9 C)] 97.8 F (36.6 C) (07/25 0853) Pulse Rate:  [56-81] 56 (07/25 0853) Resp:  [16-19] 18 (07/25 0853) BP: (140-164)/(80-104) 142/91 (07/25 0853) SpO2:  [97 %-100 %] 97 % (07/25 0853)  Physical Exam:  General: alert Lochia: appropriate Uterine Fundus: firm Incision: healing well DVT Evaluation: No evidence of DVT seen on physical exam.  Recent Labs    04/25/23 1433 04/26/23 0442  HGB 10.6* 9.9*  HCT 32.9* 30.8*    Assessment/Plan: Status post Cesarean section. Doing well postoperatively.  Continue current care. Will check CMP today. BP stable just started on Procardia and Lasix. Continue to monitor BP and adjust meds as need.   Hermina Staggers, MD 04/27/2023, 9:57 AM

## 2023-04-27 NOTE — Progress Notes (Signed)
MOB was referred for history of depression/anxiety. * Referral screened out by Clinical Social Worker because none of the following criteria appear to apply: ~ History of anxiety/depression during this pregnancy, or of post-partum depression following prior delivery. ~ Diagnosis of anxiety and/or depression within last 3 years OR * MOB's symptoms currently being treated with medication and/or therapy. MOB has an active Rx for Zoloft.    Patient screened out for psychosocial assessment since none of the following apply: Psychosocial stressors documented in mother or baby's chart Gestation less than 32 weeks Code at delivery  Infant with anomalies Please contact the Clinical Social Worker if specific needs arise, by MOB's request, or if MOB scores greater than 9/yes to question 10 on Edinburgh Postpartum Depression Screen.  Blaine Hamper, MSW, LCSW Clinical Social Work [phone]

## 2023-04-27 NOTE — Lactation Note (Signed)
This note was copied from a baby's chart.  NICU Lactation Consultation Note  Patient Name: Madison Cook [id number] Date: 04/27/2023 Age:26 hours  Reason for consult: Follow-up assessment; NICU baby; Late-preterm 34-36.6wks; Maternal endocrine disorder; Mother's request Type of Endocrine Disorder?: Diabetes (GDMA2 (insulin))  SUBJECTIVE Visited with family of 63 hours old LPI NICU female; Madison Cook is a P3 and called out for lactation because she was concerned about her supply. She voiced she was getting about 15 ml on day one and it went down to drops yesterday (and today) she believes is due to being on magnesium sulfate. Explained how risk factors could cause a temporary delayed in the onset of lactogenesis II and how important consistent pumping is to offset this. She voiced that pumping hasn't been every 3 hours because it's discouraging going from feeding baby a bottle to just "drops". Provided empathy and reassured her body just needs more time for her milk to come into volume. Noticed that there is no pump in room 328, NICU RN Madison Cook. got a [employer] pump for Madison Cook to use whenever she's visiting baby. She's anticipating being discharged tomorrow.   OBJECTIVE Infant data: Mother's Current Feeding Choice: Breast Milk and Donor Milk  Infant feeding assessment Scale for Readiness: 2   Maternal data: [id number] C-Section, Low Transverse Pumping frequency: 4-6 times/24 hours Pumped volume: 0 mL (drops) Flange Size: 30 (LC suggested # 27 flanges)  Pump:  ([employer] pump request submitted, she said she already cancelled her insurance pump through [employer])  ASSESSMENT Infant: Feeding Status: Scheduled 9-12-3-6  Maternal: Milk volume: Normal  INTERVENTIONS/PLAN Interventions: Interventions: Breast feeding basics reviewed; DEBP; Education; Coconut oil Tools: Pump; Flanges; Coconut oil Pump Education: Setup, frequency, and cleaning; Milk Storage  Plan: Encouraged  bilateral pumping every 3 hours, ideally 8 pumping sessions/24 hours Breast massage, hand expression and coconut oil were also encouraged prior pumping She'll call for assistance when baby is ready to go to breast   Madison Cook present. All questions and concerns answered, family to contact [facility] services PRN.  Consult Status: NICU follow-up NICU Follow-up type: Maternal D/C visit   Madison Cook 04/27/2023, 3:11 PM

## 2023-04-28 ENCOUNTER — Encounter (HOSPITAL_COMMUNITY): Payer: Self-pay | Admitting: Obstetrics & Gynecology

## 2023-04-28 ENCOUNTER — Other Ambulatory Visit (HOSPITAL_COMMUNITY): Payer: Self-pay

## 2023-04-28 MED ORDER — IBUPROFEN 600 MG PO TABS
600.0000 mg | ORAL_TABLET | Freq: Four times a day (QID) | ORAL | 0 refills | Status: AC
  Filled 2023-04-28: qty 30, 8d supply, fill #0

## 2023-04-28 MED ORDER — FUROSEMIDE 20 MG PO TABS
20.0000 mg | ORAL_TABLET | Freq: Every day | ORAL | 0 refills | Status: AC
  Filled 2023-04-28: qty 5, 5d supply, fill #0

## 2023-04-28 MED ORDER — OXYCODONE HCL 5 MG PO TABS
5.0000 mg | ORAL_TABLET | Freq: Four times a day (QID) | ORAL | 0 refills | Status: AC | PRN
  Filled 2023-04-28: qty 20, 5d supply, fill #0

## 2023-04-28 NOTE — Plan of Care (Signed)
Problem: Education: Goal: Knowledge of disease or condition will improve Outcome: Adequate for Discharge Goal: Knowledge of the prescribed therapeutic regimen will improve Outcome: Adequate for Discharge Goal: Individualized Educational Video(s) Outcome: Adequate for Discharge   Problem: Clinical Measurements: Goal: Complications related to the disease process, condition or treatment will be avoided or minimized Outcome: Adequate for Discharge   Problem: Education: Goal: Ability to describe self-care measures that may prevent or decrease complications (Diabetes Survival Skills Education) will improve Outcome: Adequate for Discharge Goal: Individualized Educational Video(s) Outcome: Adequate for Discharge   Problem: Coping: Goal: Ability to adjust to condition or change in health will improve Outcome: Adequate for Discharge   Problem: Fluid Volume: Goal: Ability to maintain a balanced intake and output will improve Outcome: Adequate for Discharge   Problem: Health Behavior/Discharge Planning: Goal: Ability to identify and utilize available resources and services will improve Outcome: Adequate for Discharge Goal: Ability to manage health-related needs will improve Outcome: Adequate for Discharge   Problem: Metabolic: Goal: Ability to maintain appropriate glucose levels will improve Outcome: Adequate for Discharge   Problem: Nutritional: Goal: Maintenance of adequate nutrition will improve Outcome: Adequate for Discharge Goal: Progress toward achieving an optimal weight will improve Outcome: Adequate for Discharge   Problem: Skin Integrity: Goal: Risk for impaired skin integrity will decrease Outcome: Adequate for Discharge   Problem: Tissue Perfusion: Goal: Adequacy of tissue perfusion will improve Outcome: Adequate for Discharge   Problem: Education: Goal: Knowledge of General Education information will improve Description: Including pain rating scale,  medication(s)/side effects and non-pharmacologic comfort measures Outcome: Adequate for Discharge   Problem: Health Behavior/Discharge Planning: Goal: Ability to manage health-related needs will improve Outcome: Adequate for Discharge   Problem: Clinical Measurements: Goal: Ability to maintain clinical measurements within normal limits will improve Outcome: Adequate for Discharge Goal: Will remain free from infection Outcome: Adequate for Discharge Goal: Diagnostic test results will improve Outcome: Adequate for Discharge Goal: Respiratory complications will improve Outcome: Adequate for Discharge Goal: Cardiovascular complication will be avoided Outcome: Adequate for Discharge   Problem: Activity: Goal: Risk for activity intolerance will decrease Outcome: Adequate for Discharge   Problem: Nutrition: Goal: Adequate nutrition will be maintained Outcome: Adequate for Discharge   Problem: Coping: Goal: Level of anxiety will decrease Outcome: Adequate for Discharge   Problem: Elimination: Goal: Will not experience complications related to bowel motility Outcome: Adequate for Discharge Goal: Will not experience complications related to urinary retention Outcome: Adequate for Discharge   Problem: Pain Managment: Goal: General experience of comfort will improve Outcome: Adequate for Discharge   Problem: Safety: Goal: Ability to remain free from injury will improve Outcome: Adequate for Discharge   Problem: Skin Integrity: Goal: Risk for impaired skin integrity will decrease Outcome: Adequate for Discharge   Problem: Education: Goal: Knowledge of disease or condition will improve Outcome: Adequate for Discharge Goal: Knowledge of the prescribed therapeutic regimen will improve Outcome: Adequate for Discharge   Problem: Fluid Volume: Goal: Peripheral tissue perfusion will improve Outcome: Adequate for Discharge   Problem: Clinical Measurements: Goal: Complications  related to disease process, condition or treatment will be avoided or minimized Outcome: Adequate for Discharge   Problem: Education: Goal: Knowledge of Childbirth will improve Outcome: Adequate for Discharge Goal: Ability to make informed decisions regarding treatment and plan of care will improve Outcome: Adequate for Discharge Goal: Ability to state and carry out methods to decrease the pain will improve Outcome: Adequate for Discharge Goal: Individualized Educational Video(s) Outcome: Adequate for Discharge  Problem: Life Cycle: Goal: Ability to make normal progression through stages of labor will improve Outcome: Adequate for Discharge Goal: Ability to effectively push during vaginal delivery will improve Outcome: Adequate for Discharge   Problem: Role Relationship: Goal: Will demonstrate positive interactions with the child Outcome: Adequate for Discharge   Problem: Safety: Goal: Risk of complications during labor and delivery will decrease Outcome: Adequate for Discharge   Problem: Education: Goal: Knowledge of disease or condition will improve Outcome: Adequate for Discharge Goal: Knowledge of the prescribed therapeutic regimen will improve Outcome: Adequate for Discharge   Problem: Fluid Volume: Goal: Peripheral tissue perfusion will improve Outcome: Adequate for Discharge   Problem: Clinical Measurements: Goal: Complications related to disease process, condition or treatment will be avoided or minimized Outcome: Adequate for Discharge

## 2023-04-28 NOTE — Lactation Note (Signed)
This note was copied from a baby's chart.  NICU Lactation Consultation Note  Patient Name: Madison Cook [id number] Date: 04/28/2023 Age:26 hours  Reason for consult: Follow-up assessment; NICU baby; Late-preterm 34-36.6wks; Maternal endocrine disorder Type of Endocrine Disorder?: Diabetes  SUBJECTIVE  LC in to visit with P3 Mom of LPTI in the NICU "Madison Cook".  Mom is thrilled that she collect 3 ml of colostrum last evening.  Milk in the refrigerator.  Mom admits she hasn't pumped since last night.  She hasn't used a DEBP.  LC provided a hands-free pumping top and assisted her to double pump every 3 hrs the best she can.    Baby was started on bottles last night and Mom would like to try to breastfeed Madison Cook.   Mom to be discharged today. Mom encouraged to hold baby STS and request lactation to assist prn.  RN notified of Mom's wishes.   OBJECTIVE Infant data: Mother's Current Feeding Choice: Breast Milk and Donor Milk  Infant feeding assessment Scale for Readiness: 2 Scale for Quality: 3   Maternal data: [id number] C-Section, Low Transverse Pumping frequency: Hasn't pumped since yesterday and using hand pump only.  LC provided a hands-free pumping top and sized flanges to a 24 and encouraged consistent pumping every 3 hrs. Pumped volume: 3 mL Flange Size: 24  Pump: Stork Pump  ASSESSMENT Infant: Feeding Status: Scheduled 9-12-3-6  Maternal: Milk volume: Normal  INTERVENTIONS/PLAN Interventions: Interventions: Breast feeding basics reviewed; Skin to skin; Breast massage; Hand express; Hand pump; DEBP; Education; Coconut oil Discharge Education: Engorgement and breast care Tools: Pump; Flanges; Coconut oil; Hands-free pumping top; Bottle Pump Education: Setup, frequency, and cleaning; Milk Storage  Plan: Consult Status: NICU follow-up NICU Follow-up type: Verify onset of copious milk; Verify absence of engorgement   [name] 04/28/2023, 10:49 AM

## 2023-04-28 NOTE — Plan of Care (Signed)
Problem: Education: Goal: Knowledge of disease or condition will improve Outcome: Adequate for Discharge Goal: Knowledge of the prescribed therapeutic regimen will improve Outcome: Adequate for Discharge Goal: Individualized Educational Video(s) Outcome: Adequate for Discharge   Problem: Clinical Measurements: Goal: Complications related to the disease process, condition or treatment will be avoided or minimized Outcome: Adequate for Discharge   Problem: Education: Goal: Ability to describe self-care measures that may prevent or decrease complications (Diabetes Survival Skills Education) will improve Outcome: Adequate for Discharge Goal: Individualized Educational Video(s) Outcome: Adequate for Discharge   Problem: Coping: Goal: Ability to adjust to condition or change in health will improve Outcome: Adequate for Discharge   Problem: Fluid Volume: Goal: Ability to maintain a balanced intake and output will improve Outcome: Adequate for Discharge   Problem: Health Behavior/Discharge Planning: Goal: Ability to identify and utilize available resources and services will improve Outcome: Adequate for Discharge Goal: Ability to manage health-related needs will improve Outcome: Adequate for Discharge   Problem: Metabolic: Goal: Ability to maintain appropriate glucose levels will improve Outcome: Adequate for Discharge   Problem: Nutritional: Goal: Maintenance of adequate nutrition will improve Outcome: Adequate for Discharge Goal: Progress toward achieving an optimal weight will improve Outcome: Adequate for Discharge   Problem: Skin Integrity: Goal: Risk for impaired skin integrity will decrease Outcome: Adequate for Discharge   Problem: Tissue Perfusion: Goal: Adequacy of tissue perfusion will improve Outcome: Adequate for Discharge   Problem: Education: Goal: Knowledge of General Education information will improve Description: Including pain rating scale,  medication(s)/side effects and non-pharmacologic comfort measures Outcome: Adequate for Discharge   Problem: Health Behavior/Discharge Planning: Goal: Ability to manage health-related needs will improve Outcome: Adequate for Discharge   Problem: Clinical Measurements: Goal: Ability to maintain clinical measurements within normal limits will improve Outcome: Adequate for Discharge Goal: Will remain free from infection Outcome: Adequate for Discharge Goal: Diagnostic test results will improve Outcome: Adequate for Discharge Goal: Respiratory complications will improve Outcome: Adequate for Discharge Goal: Cardiovascular complication will be avoided Outcome: Adequate for Discharge   Problem: Activity: Goal: Risk for activity intolerance will decrease Outcome: Adequate for Discharge   Problem: Nutrition: Goal: Adequate nutrition will be maintained Outcome: Adequate for Discharge   Problem: Coping: Goal: Level of anxiety will decrease Outcome: Adequate for Discharge   Problem: Elimination: Goal: Will not experience complications related to bowel motility Outcome: Adequate for Discharge Goal: Will not experience complications related to urinary retention Outcome: Adequate for Discharge   Problem: Pain Managment: Goal: General experience of comfort will improve Outcome: Adequate for Discharge   Problem: Safety: Goal: Ability to remain free from injury will improve Outcome: Adequate for Discharge   Problem: Skin Integrity: Goal: Risk for impaired skin integrity will decrease Outcome: Adequate for Discharge   Problem: Education: Goal: Knowledge of disease or condition will improve Outcome: Adequate for Discharge Goal: Knowledge of the prescribed therapeutic regimen will improve Outcome: Adequate for Discharge   Problem: Fluid Volume: Goal: Peripheral tissue perfusion will improve Outcome: Adequate for Discharge   Problem: Clinical Measurements: Goal: Complications  related to disease process, condition or treatment will be avoided or minimized Outcome: Adequate for Discharge   Problem: Education: Goal: Knowledge of Childbirth will improve Outcome: Adequate for Discharge Goal: Ability to make informed decisions regarding treatment and plan of care will improve Outcome: Adequate for Discharge Goal: Ability to state and carry out methods to decrease the pain will improve Outcome: Adequate for Discharge Goal: Individualized Educational Video(s) Outcome: Adequate for Discharge  Problem: Life Cycle: Goal: Ability to make normal progression through stages of labor will improve Outcome: Adequate for Discharge Goal: Ability to effectively push during vaginal delivery will improve Outcome: Adequate for Discharge   Problem: Role Relationship: Goal: Will demonstrate positive interactions with the child Outcome: Adequate for Discharge   Problem: Safety: Goal: Risk of complications during labor and delivery will decrease Outcome: Adequate for Discharge   Problem: Pain Management: Goal: Relief or control of pain from uterine contractions will improve Outcome: Adequate for Discharge   Problem: Education: Goal: Knowledge of disease or condition will improve Outcome: Adequate for Discharge Goal: Knowledge of the prescribed therapeutic regimen will improve Outcome: Adequate for Discharge   Problem: Fluid Volume: Goal: Peripheral tissue perfusion will improve Outcome: Adequate for Discharge   Problem: Clinical Measurements: Goal: Complications related to disease process, condition or treatment will be avoided or minimized Outcome: Adequate for Discharge   Problem: Education: Goal: Knowledge of the prescribed therapeutic regimen will improve Outcome: Adequate for Discharge Goal: Understanding of sexual limitations or changes related to disease process or condition will improve Outcome: Adequate for Discharge Goal: Individualized Educational  Video(s) Outcome: Adequate for Discharge   Problem: Self-Concept: Goal: Communication of feelings regarding changes in body function or appearance will improve Outcome: Adequate for Discharge   Problem: Skin Integrity: Goal: Demonstration of wound healing without infection will improve Outcome: Adequate for Discharge   Problem: Education: Goal: Knowledge of condition will improve Outcome: Adequate for Discharge Goal: Individualized Educational Video(s) Outcome: Adequate for Discharge Goal: Individualized Newborn Educational Video(s) Outcome: Adequate for Discharge   Problem: Activity: Goal: Will verbalize the importance of balancing activity with adequate rest periods Outcome: Adequate for Discharge Goal: Ability to tolerate increased activity will improve Outcome: Adequate for Discharge   Problem: Coping: Goal: Ability to identify and utilize available resources and services will improve Outcome: Adequate for Discharge   Problem: Life Cycle: Goal: Chance of risk for complications during the postpartum period will decrease Outcome: Adequate for Discharge   Problem: Role Relationship: Goal: Ability to demonstrate positive interaction with newborn will improve Outcome: Adequate for Discharge   Problem: Skin Integrity: Goal: Demonstration of wound healing without infection will improve Outcome: Adequate for Discharge

## 2023-05-02 ENCOUNTER — Other Ambulatory Visit: Payer: Medicaid Other

## 2023-05-02 ENCOUNTER — Ambulatory Visit: Payer: Medicaid Other

## 2023-05-02 DIAGNOSIS — O34219 Maternal care for unspecified type scar from previous cesarean delivery: Secondary | ICD-10-CM

## 2023-05-02 DIAGNOSIS — O133 Gestational [pregnancy-induced] hypertension without significant proteinuria, third trimester: Secondary | ICD-10-CM

## 2023-05-02 NOTE — Progress Notes (Unsigned)
Subjective:  Madison Cook is a 27 y.o. female here for BP check and incision check.  Hypertension ROS: taking medications as instructed, no medication side effects noted, no TIA's, no chest pain on exertion, no dyspnea on exertion, and no swelling of ankles.   Pt reports incision healing well  Objective:  LMP 08/22/2022   Appearance alert, well appearing, and in no distress. Incision healing well   Assessment:   Blood Pressure today in office needs improvement.  Incision healed nicely, no signs of infection, scar looks good  Plan: Follow up 1 week  .

## 2023-05-03 ENCOUNTER — Encounter: Payer: Medicaid Other | Admitting: Family Medicine

## 2023-05-08 ENCOUNTER — Ambulatory Visit (HOSPITAL_COMMUNITY): Payer: Self-pay

## 2023-05-08 NOTE — Lactation Note (Signed)
This note was copied from a baby's chart.  NICU Lactation Consultation Note  Patient Name: Madison Cook [id number] Date: 05/08/2023 Age:27 years  Reason for consult: Follow-up assessment; Breastfeeding assistance; Maternal endocrine disorder; NICU baby; Late-preterm 34-36.6wks Type of Endocrine Disorder?: Diabetes  SUBJECTIVE  LC in to assist with 3 pm feeding at the breast.  Baby had been cueing some.  Mom positioned baby in cross cradle hold on right breast.  Baby able to latch without nipple shield, if LC sandwiched areola when bringing baby to breast, but no sucking attained.  After a couple attempts, LC initiated a 20 mm nipple shield, showing Mom how to use to draw nipple into shield.  Baby not rooting anymore.  Talked about the importance of not using the shield to push her nipple into baby's mouth.  Educated on the importance of bring baby onto breast when she opens wide.  Baby sound asleep after only a couple non-nutritive sucks.  Encouraged Mom to offer the breast with feeding cues.  Not ready for IDF yet.  Encouraged Mom to continue her consistent pump to support a full milk supply.    OBJECTIVE Infant data: No data recorded Infant feeding assessment Scale for Readiness: 2 Scale for Quality: 5   Maternal data: [id number] C-Section, Low Transverse Pumping frequency: 6-8 times per 24 hrs Pumped volume: 120 mL Flange Size: 24  Pump: Stork Pump  ASSESSMENT Infant: LATCH Documentation Latch: 1 (a few sucks) Audible Swallowing: 0 Type of Nipple: 2 Comfort (Breast/Nipple): 2 Hold (Positioning): 1 LATCH Score: 6   Maternal: Milk volume: Normal  INTERVENTIONS/PLAN Interventions: Interventions: Breast feeding basics reviewed; Assisted with latch; Skin to skin; Breast massage; Hand express; Breast compression; Adjust position; Support pillows; Position options; Expressed milk; DEBP; Education Tools: Pump; Flanges; Nipple Shields Pump Education: Setup, frequency,  and cleaning; Milk Storage Nipple shield size: 20  Plan: Consult Status: NICU follow-up NICU Follow-up type: Weekly NICU follow up   [name] 05/08/2023, 5:38 PM

## 2023-05-10 ENCOUNTER — Other Ambulatory Visit: Payer: Medicaid Other

## 2023-05-10 ENCOUNTER — Encounter: Payer: Medicaid Other | Admitting: Family Medicine

## 2023-05-12 ENCOUNTER — Ambulatory Visit (HOSPITAL_COMMUNITY): Payer: Self-pay

## 2023-05-12 NOTE — Lactation Note (Signed)
This note was copied from a baby's chart.  NICU Lactation Consultation Note  Patient Name: Madison Cook [id number] Date: 05/12/2023 Age:26 wk.o.  Reason for consult: Follow-up assessment; Weekly NICU follow-up; NICU baby; Maternal endocrine disorder; Late-preterm 34-36.6wks Type of Endocrine Disorder?: Diabetes  SUBJECTIVE  LC in to visit with P3 Mom of baby "Madison Cook" in the NICU.  Baby is now taking 47% PO.  Mom continues her consistent pumping and supporting a full abundant milk supply.   Baby is mainly gavage and bottle feeding.  Mom still would like to try the breast, but yesterday, baby had a number of self resolving bradycardic episodes.  Encouraged Mom to call out for assistance prn with positioning and latching prn.  Mom feels comfortable with bottle feeding as baby is doing better.  Reassured Mom that as baby is growing and maturing, she will be more able to latch and transfer milk at the breast.    Encouraged STS and watching baby for feeding cues.  Mom to ask for lactation prn.  OBJECTIVE Infant data: No data recorded Infant feeding assessment Scale for Readiness: 2 Scale for Quality: 3   Maternal data: [id number] C-Section, Low Transverse Pumping frequency: 8 times per 24 hrs Pumped volume: 240 mL Flange Size: 24  Pump: Stork Pump  ASSESSMENT Infant: Feeding Status: Scheduled 9-12-3-6  Maternal: Milk volume: Abundant  INTERVENTIONS/PLAN Interventions: Interventions: Breast feeding basics reviewed; Skin to skin; Breast massage; Hand express; DEBP Tools: Pump; Flanges; Bottle Pump Education: Setup, frequency, and cleaning; Milk Storage  Plan: Consult Status: NICU follow-up NICU Follow-up type: Weekly NICU follow up   Madison Cook 05/12/2023, 4:24 PM

## 2023-05-17 ENCOUNTER — Encounter: Payer: Medicaid Other | Admitting: Family Medicine

## 2023-05-17 ENCOUNTER — Other Ambulatory Visit: Payer: Medicaid Other

## 2023-05-18 ENCOUNTER — Ambulatory Visit (HOSPITAL_COMMUNITY): Payer: Self-pay

## 2023-05-18 NOTE — Lactation Note (Signed)
This note was copied from a baby's chart.  NICU Lactation Consultation Note  Patient Name: Girl Milanie Fest [id number] Date: 05/18/2023 Age:26 wk.o.  Reason for consult: Follow-up assessment; Weekly NICU follow-up; NICU baby; Early term 37-38.6wks; Maternal endocrine disorder Type of Endocrine Disorder?: Diabetes  SUBJECTIVE  LC stopped by and Mom was resting on the couch.  Mom encouraged to call for [facility] if she would like assistance with positioning and latching baby at the breast.    OBJECTIVE Infant data: No data recorded Infant feeding assessment Scale for Readiness: 2 Scale for Quality: 2   Maternal data: [id number] C-Section, Low Transverse Pumping frequency: 8 times per 24 hrs Pumped volume: 240 mL Flange Size: 24  Pump: Stork Pump  ASSESSMENT Infant: Feeding Status: Scheduled 9-12-3-6  Maternal: Milk volume: Abundant  INTERVENTIONS/PLAN Interventions: Interventions: DEBP Tools: Pump; Flanges; Bottle; Hands-free pumping top Pump Education: Setup, frequency, and cleaning; Milk Storage  Plan: Consult Status: NICU follow-up NICU Follow-up type: Weekly NICU follow up   [name] 05/18/2023, 4:46 PM

## 2023-05-20 ENCOUNTER — Ambulatory Visit (HOSPITAL_COMMUNITY): Payer: Self-pay

## 2023-05-20 NOTE — Lactation Note (Signed)
This note was copied from a baby's chart.  NICU Lactation Consultation Note  Patient Name: Madison Cook [id number] Date: 05/20/2023 Age:26 wk.o.  Reason for consult: Follow-up assessment; NICU baby; Early term 60-38.6wks; Maternal endocrine disorder Type of Endocrine Disorder?: Diabetes  SUBJECTIVE  LC in to visit with P3 Mom of baby "Maggie".  Mom has an abundant milk supply and just started to pump when LC offered to assist with a breastfeeding.  Mom agreed.    Baby placed STS in football hold on right breast with 20 mm nipple shield.  Baby sucked with deep jaw extensions and swallowing identified, for 6 mins. Attempted to latch baby without the shield, but baby became fussy.  Mom to pace bottle feed the remainder of her feeding.   Encouraged Mom to offer the breast with cues daily.     OBJECTIVE Infant data: No data recorded Infant feeding assessment Scale for Readiness: 2 Scale for Quality: 3   Maternal data: [id number] C-Section, Low Transverse Pumping frequency: 8 times per 24 hrs Pumped volume: 120 mL (120-240 ml) Flange Size: 24  Pump: Stork Pump  ASSESSMENT Infant: LATCH Documentation Latch: 2 Audible Swallowing: 2 Type of Nipple: 2 Comfort (Breast/Nipple): 2 Hold (Positioning): 1 LATCH Score: 9  Feeding Status: Scheduled 9-12-3-6  Maternal: Milk volume: Normal  INTERVENTIONS/PLAN Interventions: Interventions: Breast feeding basics reviewed; Assisted with latch; Skin to skin; Breast massage; Pre-pump if needed; Support pillows; Adjust position; Breast compression; Position options; DEBP; Pace feeding Tools: Pump; Flanges; Bottle; Hands-free pumping top Pump Education: Setup, frequency, and cleaning; Milk Storage Nipple shield size: 20  Plan: Consult Status: NICU follow-up NICU Follow-up type: Weekly NICU follow up   [name] 05/20/2023, 3:25 PM

## 2023-05-22 ENCOUNTER — Ambulatory Visit (HOSPITAL_COMMUNITY): Payer: Self-pay

## 2023-05-22 NOTE — Lactation Note (Signed)
This note was copied from a baby's chart.  NICU Lactation Consultation Note  Patient Name: Madison Cook [id number] Date: 05/22/2023 Age:27 y.o.  Reason for consult: Follow-up assessment; NICU baby; Early term 59-38.6wks; Maternal endocrine disorder Type of Endocrine Disorder?: Diabetes  SUBJECTIVE  LC in to visit with P3 Mom of baby "[name]" who is [redacted]w[redacted]d AGA and just went AD LIB today.  Mom resting currently but denies any problems.  Mom's milk supply is stable and she is pumping consistently.  Asked Mom if she would like assistance with breastfeeding and she said yes.  LC suggested tomorrow.  Encouraged STS with baby and offering the breast with feeding cues. Encouraged continued double pumping if baby is supplemented. Mom to call out tomorrow for a feeding assist.  OBJECTIVE Infant data: No data recorded Infant feeding assessment Scale for Readiness: 1 Scale for Quality: 2   Maternal data: [id number] C-Section, Low Transverse Pumping frequency: 8 times per 24 hrs Pumped volume: 120 mL (120-240 ml) Flange Size: 24  Pump: Stork Pump  ASSESSMENT Infant: Feeding Status: Ad lib  Maternal: Milk volume: Normal  INTERVENTIONS/PLAN Interventions: Interventions: Skin to skin; Breast massage; Hand express; DEBP Tools: Pump; Flanges; Nipple Dorris Carnes; Hands-free pumping top Pump Education: Setup, frequency, and cleaning; Milk Storage Nipple shield size: 20  Plan: Consult Status: NICU follow-up NICU Follow-up type: Weekly NICU follow up   [name] 05/22/2023, 3:50 PM

## 2023-05-23 ENCOUNTER — Ambulatory Visit: Payer: Medicaid Other | Admitting: Obstetrics and Gynecology

## 2023-05-23 ENCOUNTER — Other Ambulatory Visit: Payer: Medicaid Other

## 2023-05-24 ENCOUNTER — Encounter: Payer: Medicaid Other | Admitting: Family Medicine

## 2023-05-25 ENCOUNTER — Ambulatory Visit (HOSPITAL_COMMUNITY): Payer: Self-pay

## 2023-05-25 NOTE — Lactation Note (Signed)
This note was copied from a baby'Cook chart.  NICU Lactation Consultation Note  Patient Name: Madison Cook [id number] Date: 05/25/2023 Age:26 wk.o.  Reason for consult: Weekly NICU follow-up; Early term 37-38.6wks; Maternal endocrine disorder; NICU baby Type of Endocrine Disorder?: Diabetes (GDMA2 (insulin))  SUBJECTIVE Visited with family of 48 32/50 weeks old AGA NICU female; Madison Cook is a P3 and reports she continues pumping consistently; she'Cook also taking baby to breast once/day using a NS # 20; baby won't latch without it. She reports feedings at the breast are comfortable; her plan going home will be taking baby to breast along with pumping and bottle feeding. Baby "Madison Cook" is going home today. Reviewed discharge education and the importance of consistent pumping after feedings/attempts at the breast and to protect her supply. Provided two additional NS # 20 and 8 oz. bottles per her request. She politely declined an LC OP F/U but voiced she has their contact info in case she needs to reach out. No other support person at this time. All questions and concerns answered, family to contact [facility] PR[facility].  OBJECTIVE Infant data: Mother'Cook Current Feeding Choice: Breast Milk  Infant feeding assessment Scale for Readiness: 2 Scale for Quality: 2   Maternal data: [id number] C-Section, Low Transverse Pumping frequency: 6 times/24 hours Pumped volume: 180 mL (up to 240 ml in the AM) Flange Size: 24  Pump: Stork Pump  ASSESSMENT Infant: Feeding Status: Ad lib  Maternal: Milk volume: Normal  INTERVENTIONS/PLAN Interventions: Interventions: Breast feeding basics reviewed; DEBP; Education; Coconut oil Discharge Education: Outpatient recommendation Tools: Pump; Flanges; Hands-free pumping top; Nipple Shields; Coconut oil Pump Education: Setup, frequency, and cleaning; Milk Storage Nipple shield size: 20  Plan: Consult Status: Complete   Madison Cook Madison Cook 05/25/2023, 1:01 PM

## 2023-05-31 ENCOUNTER — Encounter: Payer: Medicaid Other | Admitting: Obstetrics & Gynecology

## 2023-12-21 ENCOUNTER — Ambulatory Visit (HOSPITAL_COMMUNITY): Admit: 2023-12-21 | Payer: Medicaid Other | Admitting: Orthopedic Surgery

## 2023-12-21 SURGERY — ACHILLES TENDON LENGTHENING
Anesthesia: General | Laterality: Right

## 2024-01-30 ENCOUNTER — Other Ambulatory Visit: Payer: Self-pay | Admitting: Obstetrics & Gynecology

## 2024-04-12 ENCOUNTER — Encounter (HOSPITAL_COMMUNITY): Payer: Self-pay | Admitting: *Deleted

## 2024-04-12 ENCOUNTER — Other Ambulatory Visit: Payer: Self-pay

## 2024-04-12 ENCOUNTER — Emergency Department (HOSPITAL_COMMUNITY)

## 2024-04-12 ENCOUNTER — Emergency Department (HOSPITAL_COMMUNITY)
Admission: EM | Admit: 2024-04-12 | Discharge: 2024-04-13 | Disposition: A | Discharge status: Home / Self Care | Attending: Emergency Medicine | Admitting: Emergency Medicine

## 2024-04-12 DIAGNOSIS — E860 Dehydration: Secondary | ICD-10-CM | POA: Insufficient documentation

## 2024-04-12 DIAGNOSIS — L559 Sunburn, unspecified: Secondary | ICD-10-CM | POA: Insufficient documentation

## 2024-04-12 DIAGNOSIS — J45909 Unspecified asthma, uncomplicated: Secondary | ICD-10-CM | POA: Insufficient documentation

## 2024-04-12 DIAGNOSIS — R202 Paresthesia of skin: Secondary | ICD-10-CM | POA: Diagnosis present

## 2024-04-12 LAB — CBC WITH DIFFERENTIAL/PLATELET
Abs Immature Granulocytes: 0.1 K/uL — ABNORMAL HIGH (ref 0.00–0.07)
Basophils Absolute: 0 K/uL (ref 0.0–0.1)
Basophils Relative: 0 %
Eosinophils Absolute: 0.1 K/uL (ref 0.0–0.5)
Eosinophils Relative: 0 %
HCT: 41.5 % (ref 36.0–46.0)
Hemoglobin: 13.4 g/dL (ref 12.0–15.0)
Immature Granulocytes: 1 %
Lymphocytes Relative: 11 %
Lymphs Abs: 2.3 K/uL (ref 0.7–4.0)
MCH: 28.7 pg (ref 26.0–34.0)
MCHC: 32.3 g/dL (ref 30.0–36.0)
MCV: 88.9 fL (ref 80.0–100.0)
Monocytes Absolute: 0.8 K/uL (ref 0.1–1.0)
Monocytes Relative: 4 %
Neutro Abs: 17.5 K/uL — ABNORMAL HIGH (ref 1.7–7.7)
Neutrophils Relative %: 84 %
Platelets: 320 K/uL (ref 150–400)
RBC: 4.67 MIL/uL (ref 3.87–5.11)
RDW: 13.3 % (ref 11.5–15.5)
WBC: 20.7 K/uL — ABNORMAL HIGH (ref 4.0–10.5)
nRBC: 0 % (ref 0.0–0.2)

## 2024-04-12 LAB — URINALYSIS, ROUTINE W REFLEX MICROSCOPIC
Bilirubin Urine: NEGATIVE
Glucose, UA: NEGATIVE mg/dL
Hgb urine dipstick: NEGATIVE
Ketones, ur: NEGATIVE mg/dL
Leukocytes,Ua: NEGATIVE
Nitrite: NEGATIVE
Protein, ur: NEGATIVE mg/dL
Specific Gravity, Urine: 1.014 (ref 1.005–1.030)
pH: 5 (ref 5.0–8.0)

## 2024-04-12 LAB — CK: Total CK: 78 U/L (ref 38–234)

## 2024-04-12 LAB — COMPREHENSIVE METABOLIC PANEL WITH GFR
ALT: 28 U/L (ref 0–44)
AST: 26 U/L (ref 15–41)
Albumin: 3.9 g/dL (ref 3.5–5.0)
Alkaline Phosphatase: 50 U/L (ref 38–126)
Anion gap: 12 (ref 5–15)
BUN: 13 mg/dL (ref 6–20)
CO2: 22 mmol/L (ref 22–32)
Calcium: 9.1 mg/dL (ref 8.9–10.3)
Chloride: 103 mmol/L (ref 98–111)
Creatinine, Ser: 0.84 mg/dL (ref 0.44–1.00)
GFR, Estimated: >60 mL/min (ref >60)
Glucose, Bld: 110 mg/dL — ABNORMAL HIGH (ref 70–99)
Potassium: 4.1 mmol/L (ref 3.5–5.1)
Sodium: 137 mmol/L (ref 135–145)
Total Bilirubin: 0.6 mg/dL (ref 0.0–1.2)
Total Protein: 7.4 g/dL (ref 6.5–8.1)

## 2024-04-12 LAB — MAGNESIUM: Magnesium: 2.1 mg/dL (ref 1.7–2.4)

## 2024-04-12 NOTE — ED Provider Triage Note (Signed)
 Emergency Medicine Provider Triage Evaluation Note  LILYAUNA MIEDEMA , a 28 y.o. female  was evaluated in triage.  Patient presents with mom.  Mom provides most of the history.  Patient is slow to respond but is alert and oriented and follows commands without issue.  Sensation intact bilaterally.  Cranial nerves III through XII intact.  Patient was swimming earlier.  Has evidence of sunburn as well.  Review of Systems  Positive: As above Negative: As above  Physical Exam  BP (!) 150/100 (BP Location: Left Arm)   Pulse (!) 115   Temp 97.8 F (36.6 C)   Resp 16   Ht 5' 3 (1.6 m)   Wt 123.4 kg   LMP 03/13/2024   SpO2 98%   BMI 48.19 kg/m  Gen:   Awake, no distress   Resp:  Normal effort  MSK:   Moves extremities without difficulty  Other:  No focal neurological deficits.    Medical Decision Making  Medically screening exam initiated at 8:35 PM.  Appropriate orders placed.  LYNEA ROLLISON was informed that the remainder of the evaluation will be completed by another provider, this initial triage assessment does not replace that evaluation, and the importance of remaining in the ED until their evaluation is complete.  CT head and labs ordered.   Hildegard Loge, PA-C 04/12/24 2036

## 2024-04-12 NOTE — ED Triage Notes (Signed)
 Pt bib family member c/o numbness, tingling, blurred vision, ear concerns and hot flashes while in [employer] LKW 19:00. [place]  Pt states she feels nauseous. Pt states she was standing in [employer] when the the symptoms came at once.   Mother states pt has hx HTN and panic attacks.
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
## 2024-04-13 NOTE — Discharge Instructions (Signed)
 You were evaluated in the Emergency Department and after careful evaluation, we did not find any emergent condition requiring admission or further testing in the hospital.  Your exam/testing today is overall reassuring.  Blood work and CT scan are normal.  Recommend continued hydration and rest for the next few days.  Please return to the Emergency Department if you experience any worsening of your condition.   Thank you for allowing us  to be a part of your care.

## 2024-04-13 NOTE — ED Notes (Signed)
 This RN reviewed discharge instructions with patient. She verbalized understanding and denied any further questions. PT well appearing upon discharge and reports no pain. Pt wheeled out to exit, but is ambulatory upon departure.

## 2024-04-29 ENCOUNTER — Other Ambulatory Visit: Payer: Self-pay

## 2024-04-29 DIAGNOSIS — I10 Essential (primary) hypertension: Secondary | ICD-10-CM

## 2024-04-29 NOTE — Progress Notes (Signed)
 Rx refill approved for 2 refills on Nifedipine  pt needs PCP to manage and future refills.

## 2024-06-25 NOTE — Telephone Encounter (Signed)
 TC from pt regarding referral to PCP pt notes being contacted for an appt yet was unsure since she has not been seen here with us  in a while Pt made aware due to HTN and B/P Rx being refilled back on 04/29/24 it was noted she would need to F/U w/ a PCP to monitor B/P and take over Rx management if needed.   Pt voiced understanding.

## 2024-09-23 ENCOUNTER — Other Ambulatory Visit: Payer: Self-pay | Admitting: Obstetrics and Gynecology

## 2024-11-01 ENCOUNTER — Other Ambulatory Visit: Payer: Self-pay | Admitting: Obstetrics and Gynecology
